# Patient Record
Sex: Female | Born: 1965 | Hispanic: Yes | Smoking: Never smoker
Health system: Southern US, Community
[De-identification: ages and names within clinical notes are randomized; demographics above are authoritative.]

## PROBLEM LIST (undated history)

## (undated) DIAGNOSIS — I10 Essential (primary) hypertension: Secondary | ICD-10-CM

## (undated) DIAGNOSIS — Z87442 Personal history of urinary calculi: Secondary | ICD-10-CM

## (undated) DIAGNOSIS — E119 Type 2 diabetes mellitus without complications: Secondary | ICD-10-CM

## (undated) DIAGNOSIS — M5430 Sciatica, unspecified side: Secondary | ICD-10-CM

## (undated) DIAGNOSIS — F419 Anxiety disorder, unspecified: Secondary | ICD-10-CM

## (undated) DIAGNOSIS — E039 Hypothyroidism, unspecified: Secondary | ICD-10-CM

## (undated) DIAGNOSIS — K219 Gastro-esophageal reflux disease without esophagitis: Secondary | ICD-10-CM

## (undated) DIAGNOSIS — M549 Dorsalgia, unspecified: Secondary | ICD-10-CM

## (undated) DIAGNOSIS — G4730 Sleep apnea, unspecified: Secondary | ICD-10-CM

## (undated) DIAGNOSIS — E785 Hyperlipidemia, unspecified: Secondary | ICD-10-CM

## (undated) DIAGNOSIS — F32A Depression, unspecified: Secondary | ICD-10-CM

## (undated) HISTORY — DX: Sciatica, unspecified side: M54.30

## (undated) HISTORY — PX: OTHER SURGICAL HISTORY: SHX169

## (undated) HISTORY — DX: Dorsalgia, unspecified: M54.9

## (undated) HISTORY — PX: APPENDECTOMY: SHX54

## (undated) HISTORY — DX: Essential (primary) hypertension: I10

## (undated) HISTORY — PX: ABDOMINAL HYSTERECTOMY: SHX81

## (undated) HISTORY — DX: Type 2 diabetes mellitus without complications: E11.9

## (undated) HISTORY — PX: COLONOSCOPY WITH ESOPHAGOGASTRODUODENOSCOPY (EGD): SHX5779

## (undated) HISTORY — PX: CHOLECYSTECTOMY: SHX55

## (undated) HISTORY — DX: Hyperlipidemia, unspecified: E78.5

## (undated) HISTORY — PX: CERVIX SURGERY: SHX593

## (undated) HISTORY — DX: Hypothyroidism, unspecified: E03.9

## (undated) HISTORY — PX: TONSILLECTOMY: SUR1361

---

## 2000-11-13 HISTORY — PX: NECK SURGERY: SHX720

## 2018-03-29 ENCOUNTER — Encounter: Payer: Self-pay | Admitting: Family Medicine

## 2018-03-29 ENCOUNTER — Ambulatory Visit (INDEPENDENT_AMBULATORY_CARE_PROVIDER_SITE_OTHER): Payer: BLUE CROSS/BLUE SHIELD | Admitting: Family Medicine

## 2018-03-29 DIAGNOSIS — E039 Hypothyroidism, unspecified: Secondary | ICD-10-CM | POA: Diagnosis not present

## 2018-03-29 DIAGNOSIS — I10 Essential (primary) hypertension: Secondary | ICD-10-CM

## 2018-03-29 DIAGNOSIS — G8929 Other chronic pain: Secondary | ICD-10-CM

## 2018-03-29 DIAGNOSIS — M545 Low back pain, unspecified: Secondary | ICD-10-CM | POA: Insufficient documentation

## 2018-03-29 DIAGNOSIS — Z1231 Encounter for screening mammogram for malignant neoplasm of breast: Secondary | ICD-10-CM | POA: Diagnosis not present

## 2018-03-29 DIAGNOSIS — K219 Gastro-esophageal reflux disease without esophagitis: Secondary | ICD-10-CM | POA: Diagnosis not present

## 2018-03-29 DIAGNOSIS — Z23 Encounter for immunization: Secondary | ICD-10-CM

## 2018-03-29 DIAGNOSIS — M5442 Lumbago with sciatica, left side: Secondary | ICD-10-CM

## 2018-03-29 DIAGNOSIS — I152 Hypertension secondary to endocrine disorders: Secondary | ICD-10-CM | POA: Insufficient documentation

## 2018-03-29 MED ORDER — LISINOPRIL-HYDROCHLOROTHIAZIDE 20-25 MG PO TABS
1.0000 | ORAL_TABLET | Freq: Every day | ORAL | 3 refills | Status: DC
Start: 2018-03-29 — End: 2018-06-18

## 2018-03-29 NOTE — Assessment & Plan Note (Signed)
Patient has BMI of 39 with hypertension and thyroid disease his comorbidities

## 2018-03-29 NOTE — Progress Notes (Signed)
BP 137/74   Pulse 82   Temp 97.6 F (36.4 C) (Oral)   Ht '5\' 4"'  (1.626 m)   Wt 232 lb (105.2 kg)   BMI 39.82 kg/m    Subjective:    Patient ID: Tina Chapman, female    DOB: [birth year], 52 y.o.   MRN: [id number]  HPI: Tina Chapman is a 52 y.o. female presenting on 03/29/2018 for Establish Care (edema in ankles, refill medications)   HPI Hypertension Patient is coming as a new patient to our office to establish care with our office.  Patient is currently on lisinopril-hydrochlorothiazide, and their blood pressure today is 137/74. Patient denies any lightheadedness or dizziness. Patient denies headaches, blurred vision, chest pains, shortness of breath, or weakness. Denies any side effects from medication and is content with current medication.   Hypothyroidism recheck Patient is coming in for thyroid recheck today as well. They deny any issues with hair changes or heat or cold problems or diarrhea or constipation. They deny any chest pain or palpitations. They are currently on levothyroxine 159mcrograms but she has been off of her medication recently.  GERD Patient is currently on no medication currently.  She denies any major symptoms or abdominal pain or belching or burping. She denies any blood in her stool or lightheadedness or dizziness.   Chronic low back pain with sciatica on left Patient has chronic low back pain with left-sided sciatica and she uses Flexeril and Voltaren for this which have been keeping her mostly stable.  She says she is not having too much of it today but that it comes off and on and stays a little bit with her all the time.  She says today is very mild.  She denies any numbness or weakness in either legs or loss of bowel or bladder.  Relevant past medical, surgical, family and social history reviewed and updated as indicated. Interim medical history since our last visit reviewed. Allergies and medications reviewed and updated.  Review of  Systems  Constitutional: Positive for unexpected weight change. Negative for chills and fever.  Eyes: Negative for visual disturbance.  Respiratory: Negative for chest tightness and shortness of breath.   Cardiovascular: Negative for chest pain and leg swelling.  Endocrine: Negative for cold intolerance and heat intolerance.  Genitourinary: Negative for difficulty urinating and dysuria.  Musculoskeletal: Positive for back pain. Negative for gait problem.  Skin: Negative for rash.  Neurological: Negative for light-headedness and headaches.  Psychiatric/Behavioral: Negative for agitation and behavioral problems.  All other systems reviewed and are negative.   Per HPI unless specifically indicated above  Social History   Socioeconomic History  . Marital status: Divorced    Spouse name: Not on file  . Number of children: Not on file  . Years of education: Not on file  . Highest education level: Not on file  Occupational History  . Not on file  Social Needs  . Financial resource strain: Not on file  . Food insecurity:    Worry: Not on file    Inability: Not on file  . Transportation needs:    Medical: Not on file    Non-medical: Not on file  Tobacco Use  . Smoking status: Never Smoker  . Smokeless tobacco: Never Used  Substance and Sexual Activity  . Alcohol use: Never    Frequency: Never  . Drug use: Never  . Sexual activity: Not Currently  Lifestyle  . Physical activity:    Days per week: Not on file  Minutes per session: Not on file  . Stress: Not on file  Relationships  . Social connections:    Talks on phone: Not on file    Gets together: Not on file    Attends religious service: Not on file    Active member of club or organization: Not on file    Attends meetings of clubs or organizations: Not on file    Relationship status: Not on file  . Intimate partner violence:    Fear of current or ex partner: Not on file    Emotionally abused: Not on file     Physically abused: Not on file    Forced sexual activity: Not on file  Other Topics Concern  . Not on file  Social History Narrative   3 son 47 years old, here with her    Past Surgical History:  Procedure Laterality Date  . ABDOMINAL HYSTERECTOMY    . APPENDECTOMY    . CERVIX SURGERY    . CHOLECYSTECTOMY    . TONSILLECTOMY      Family History  Problem Relation Age of Onset  . Early death Mother        19  . Arthritis Mother   . Heart disease Mother   . Arthritis Father   . Cancer Maternal Grandmother        colon, age 90  . Diabetes Paternal Uncle     Allergies as of 03/29/2018   No Known Allergies     Medication List        Accurate as of 03/29/18 11:59 PM. Always use your most recent med list.          cyclobenzaprine 5 MG tablet Commonly known as:  FLEXERIL Take 5 mg by mouth 3 (three) times daily as needed for muscle spasms.   diclofenac 50 MG EC tablet Commonly known as:  VOLTAREN Take 50 mg by mouth 2 (two) times daily.   levothyroxine 100 MCG tablet Commonly known as:  SYNTHROID, LEVOTHROID Take 100 mcg by mouth daily before breakfast.   lisinopril-hydrochlorothiazide 20-25 MG tablet Commonly known as:  PRINZIDE,ZESTORETIC Take 1 tablet by mouth daily.          Objective:    BP 137/74   Pulse 82   Temp 97.6 F (36.4 C) (Oral)   Ht '5\' 4"'  (1.626 m)   Wt 232 lb (105.2 kg)   BMI 39.82 kg/m   Wt Readings from Last 3 Encounters:  03/29/18 232 lb (105.2 kg)    Physical Exam  Constitutional: She is oriented to person, place, and time. She appears well-developed and well-nourished. No distress.  Eyes: Conjunctivae are normal.  Cardiovascular: Normal rate, regular rhythm, normal heart sounds and intact distal pulses.  No murmur heard. Pulmonary/Chest: Effort normal and breath sounds normal. No respiratory distress. She has no wheezes.  Musculoskeletal: Normal range of motion. She exhibits tenderness (Mild bilateral lumbar tenderness in a  bandlike area, negative straight leg raise bilaterally). She exhibits no edema.  Neurological: She is alert and oriented to person, place, and time. Coordination normal.  Skin: Skin is warm and dry. No rash noted. She is not diaphoretic.  Psychiatric: She has a normal mood and affect. Her behavior is normal.  Nursing note and vitals reviewed.       Assessment & Plan:   Problem List Items Addressed This Visit      Cardiovascular and Mediastinum   Hypertension   Relevant Medications   lisinopril-hydrochlorothiazide (PRINZIDE,ZESTORETIC) 20-25 MG tablet  Other Relevant Orders   CMP14+EGFR (Completed)   CBC with Differential/Platelet (Completed)     Digestive   GERD (gastroesophageal reflux disease)     Endocrine   Hypothyroidism (acquired)   Relevant Orders   TSH (Completed)   CBC with Differential/Platelet (Completed)     Other   Morbid obesity ([place])    Patient has BMI of 39 with hypertension and thyroid disease his comorbidities      Relevant Orders   Lipid panel (Completed)   Chronic lumbar pain   Relevant Medications   cyclobenzaprine (FLEXERIL) 5 MG tablet   diclofenac (VOLTAREN) 50 MG EC tablet    Other Visit Diagnoses    Screening mammogram, encounter for       Relevant Orders   MS DIGITAL SCREENING TOMO BILATERAL     We discussed BMI and obesity and weight loss strategies and will readdress that more in the future visits.  Continue Flexeril and Voltaren for her lower back.  Check thyroid to see if she still needs levothyroxine, she feels like she does not.  Continue lisinopril hydrochlorothiazide.  Follow up plan: Return in about 1 month (around 04/26/2018), or if symptoms worsen or fail to improve, for Recheck thyroid.  Caryl Pina, MD [facility] 04/03/2018, 5:56 PM

## 2018-03-30 LAB — CBC WITH DIFFERENTIAL/PLATELET
BASOS: 1 %
Basophils Absolute: 0 10*3/uL (ref 0.0–0.2)
EOS (ABSOLUTE): 0.4 10*3/uL (ref 0.0–0.4)
Eos: 7 %
Hematocrit: 40.3 % (ref 34.0–46.6)
Hemoglobin: 13.8 g/dL (ref 11.1–15.9)
IMMATURE GRANULOCYTES: 0 %
Immature Grans (Abs): 0 10*3/uL (ref 0.0–0.1)
Lymphocytes Absolute: 2.2 10*3/uL (ref 0.7–3.1)
Lymphs: 35 %
MCH: 31.8 pg (ref 26.6–33.0)
MCHC: 34.2 g/dL (ref 31.5–35.7)
MCV: 93 fL (ref 79–97)
Monocytes Absolute: 0.5 10*3/uL (ref 0.1–0.9)
Monocytes: 7 %
NEUTROS ABS: 3.1 10*3/uL (ref 1.4–7.0)
NEUTROS PCT: 50 %
PLATELETS: 279 10*3/uL (ref 150–379)
RBC: 4.34 x10E6/uL (ref 3.77–5.28)
RDW: 13.1 % (ref 12.3–15.4)
WBC: 6.2 10*3/uL (ref 3.4–10.8)

## 2018-03-30 LAB — CMP14+EGFR
ALT: 50 IU/L — AB (ref 0–32)
AST: 34 IU/L (ref 0–40)
Albumin/Globulin Ratio: 1.7 (ref 1.2–2.2)
Albumin: 4.5 g/dL (ref 3.5–5.5)
Alkaline Phosphatase: 80 IU/L (ref 39–117)
BILIRUBIN TOTAL: 1.3 mg/dL — AB (ref 0.0–1.2)
BUN/Creatinine Ratio: 21 (ref 9–23)
BUN: 15 mg/dL (ref 6–24)
CHLORIDE: 101 mmol/L (ref 96–106)
CO2: 23 mmol/L (ref 20–29)
Calcium: 9.8 mg/dL (ref 8.7–10.2)
Creatinine, Ser: 0.72 mg/dL (ref 0.57–1.00)
GFR calc non Af Amer: 97 mL/min/{1.73_m2} (ref >59)
GFR, EST AFRICAN AMERICAN: 112 mL/min/{1.73_m2} (ref >59)
GLOBULIN, TOTAL: 2.7 g/dL (ref 1.5–4.5)
Glucose: 96 mg/dL (ref 65–99)
POTASSIUM: 4.5 mmol/L (ref 3.5–5.2)
Sodium: 139 mmol/L (ref 134–144)
TOTAL PROTEIN: 7.2 g/dL (ref 6.0–8.5)

## 2018-03-30 LAB — LIPID PANEL
CHOL/HDL RATIO: 5.3 ratio — AB (ref 0.0–4.4)
Cholesterol, Total: 226 mg/dL — ABNORMAL HIGH (ref 100–199)
HDL: 43 mg/dL (ref >39)
LDL Calculated: 145 mg/dL — ABNORMAL HIGH (ref 0–99)
Triglycerides: 192 mg/dL — ABNORMAL HIGH (ref 0–149)
VLDL Cholesterol Cal: 38 mg/dL (ref 5–40)

## 2018-03-30 LAB — TSH: TSH: 81.79 u[IU]/mL — ABNORMAL HIGH (ref 0.450–4.500)

## 2018-04-03 ENCOUNTER — Telehealth: Payer: Self-pay | Admitting: Family Medicine

## 2018-04-03 DIAGNOSIS — E039 Hypothyroidism, unspecified: Secondary | ICD-10-CM

## 2018-04-03 DIAGNOSIS — R748 Abnormal levels of other serum enzymes: Secondary | ICD-10-CM

## 2018-04-03 MED ORDER — LEVOTHYROXINE SODIUM 100 MCG PO TABS: 100.0000 ug | ORAL_TABLET | Freq: Every day | ORAL | 2 refills | Status: DC

## 2018-04-03 NOTE — Telephone Encounter (Signed)
Patient's thyroid is definitely low, she needs to take her Synthroid and I have sent a new prescription of 100 mcg for her.  I want her to come in for a lab draw in 4 weeks to recheck this.  Her cholesterol is borderline elevated but that could be correlated to the thyroid issue.  1 of her liver function numbers is borderline as well but we will just monitor this and recheck it with her next lab draw as well. Arville Care, MD [facility] 04/03/2018, 8:07 AM

## 2018-04-03 NOTE — Telephone Encounter (Signed)
Attempted to contact patient - NVM 

## 2018-04-05 ENCOUNTER — Encounter: Payer: Self-pay | Admitting: Family Medicine

## 2018-04-09 ENCOUNTER — Telehealth: Payer: Self-pay | Admitting: Family Medicine

## 2018-04-09 NOTE — Telephone Encounter (Signed)
What symptoms do you have? Dizzy, headache, throwing up Sore throat started this weekend, she does not have tonsils, wants antibiotic  How long have you been sick? Since today after 12  Have you been seen for this problem? no  If your provider decides to give you a prescription, which pharmacy would you like for it to be sent to? [facility]   Patient informed that this information will be sent to the clinical staff for review and that they should receive a follow up call.

## 2018-04-09 NOTE — Telephone Encounter (Signed)
Pt is emailing DR D with mychart - will close this note

## 2018-04-10 NOTE — Telephone Encounter (Signed)
Have her come in for a visit so we can assess whether an antibiotic is warranted or not.

## 2018-04-11 ENCOUNTER — Encounter: Payer: Self-pay | Admitting: Family Medicine

## 2018-04-11 ENCOUNTER — Ambulatory Visit (INDEPENDENT_AMBULATORY_CARE_PROVIDER_SITE_OTHER): Payer: BLUE CROSS/BLUE SHIELD | Admitting: Family Medicine

## 2018-04-11 ENCOUNTER — Other Ambulatory Visit: Payer: Self-pay | Admitting: Family Medicine

## 2018-04-11 VITALS — BP 90/60 | HR 90 | Temp 96.8°F | Ht 64.0 in | Wt 231.0 lb

## 2018-04-11 DIAGNOSIS — J0100 Acute maxillary sinusitis, unspecified: Secondary | ICD-10-CM

## 2018-04-11 DIAGNOSIS — R300 Dysuria: Secondary | ICD-10-CM | POA: Diagnosis not present

## 2018-04-11 DIAGNOSIS — I10 Essential (primary) hypertension: Secondary | ICD-10-CM

## 2018-04-11 LAB — URINALYSIS, COMPLETE
Bilirubin, UA: NEGATIVE
Glucose, UA: NEGATIVE
LEUKOCYTES UA: NEGATIVE
Nitrite, UA: POSITIVE — AB
PH UA: 5.5 (ref 5.0–7.5)
RBC, UA: NEGATIVE
Specific Gravity, UA: 1.015 (ref 1.005–1.030)
Urobilinogen, Ur: 0.2 mg/dL (ref 0.2–1.0)

## 2018-04-11 LAB — MICROSCOPIC EXAMINATION
RBC, UA: NONE SEEN /hpf (ref 0–2)
Renal Epithel, UA: NONE SEEN /hpf

## 2018-04-11 MED ORDER — AMOXICILLIN-POT CLAVULANATE 875-125 MG PO TABS: 1.0000 | ORAL_TABLET | Freq: Two times a day (BID) | ORAL | 0 refills | Status: DC

## 2018-04-11 MED ORDER — ONDANSETRON HCL 4 MG PO TABS: 4.0000 mg | ORAL_TABLET | Freq: Three times a day (TID) | ORAL | 0 refills | Status: DC | PRN

## 2018-04-11 NOTE — Patient Instructions (Signed)
Great to see you!  Amoxicillin for your sinus pressure.  I believe he may have a sinus infection and you do have fluid on your ears that could be contributing to the dizziness.  Stop your blood pressure medication for 3 days then start back at 1/2 tablet daily.  Come back in about 2 weeks to see Dr. Louanne Skye.  Use Zofran as needed for nausea.

## 2018-04-11 NOTE — Progress Notes (Signed)
   HPI  Patient presents today for acute visit.  Patient explains she has had 2 days.  This began with upset stomach and dizziness described as room spinning sensation.  She also felt weak.  She missed work that day.  She also missed work yesterday and attempted to go back today. She developed worsening upset stomach had 2-3 episodes of emesis, and began having diarrhea today. She has no sick contacts.  She does have some sinus tenderness on the left side, she does have some persistent cough. She is weak feeling. She is tolerating food and fluids like usual.  PMH: Smoking status noted ROS: Per HPI  Objective: BP 90/60   Pulse 90   Temp (!) 96.8 F (36 C) (Oral)   Ht _0  (1.626 m)   Wt 231 lb (104.8 kg)   BMI 39.65 kg/m  Gen: NAD, alert, cooperative with exam HEENT: NCAT, oropharynx moist, TMs bilaterally with effusion without loss of landmarks, left-sided maxillary tenderness to palpation CV: RRR, good S1/S2, no murmur Resp: CTABL, no wheezes, non-labored Abd: SNTND, BS present, no guarding or organomegaly, no suprapubic tenderness, no CVA tenderness Ext: No edema, warm Neuro: Alert and oriented, No gross deficits  Assessment and plan:  #Acute maxillary sinusitis Patient with unusual symptoms, this is not sinusitis alone. We have covered sinusitis with Augmentin. She also has bilateral ear effusion and symptoms of viral gastroenteritis Discussed that Augmentin could contribute to more diarrhea, however I believe that will benefit her overall. Checking UA for UTI  #Hypertension BPs are soft today Recommended holding x3 days while she is sick, then restarting at half dose Follow-up with PCP in 2 weeks  #Dysuria Patient also complains of dysuria and foul-smelling urine, UA is pending, Augmentin would be good coverage if there is an infection.   Orders Placed This Encounter  Procedures  . CMP14+EGFR  . CBC with Differential/Platelet  . Urinalysis, Complete     Meds ordered this encounter  Medications  . amoxicillin-clavulanate (AUGMENTIN) 875-125 MG tablet    Sig: Take 1 tablet by mouth 2 (two) times daily.    Dispense:  20 tablet    Refill:  0  . ondansetron (ZOFRAN) 4 MG tablet    Sig: Take 1 tablet (4 mg total) by mouth every 8 (eight) hours as needed for nausea or vomiting.    Dispense:  20 tablet    Refill:  0    Laroy Apple, MD [facility] 04/11/2018, 3:25 PM

## 2018-04-11 NOTE — Addendum Note (Signed)
Addended by: Elenora Gamma on: 04/11/2018 04:57 PM   Modules accepted: Orders

## 2018-04-11 NOTE — Telephone Encounter (Signed)
Pt had appt today.

## 2018-04-12 ENCOUNTER — Encounter: Payer: Self-pay | Admitting: Family Medicine

## 2018-04-12 LAB — CBC WITH DIFFERENTIAL/PLATELET
BASOS ABS: 0.1 10*3/uL (ref 0.0–0.2)
BASOS: 1 %
EOS (ABSOLUTE): 0.4 10*3/uL (ref 0.0–0.4)
Eos: 4 %
Hematocrit: 41.5 % (ref 34.0–46.6)
Hemoglobin: 14 g/dL (ref 11.1–15.9)
Immature Grans (Abs): 0 10*3/uL (ref 0.0–0.1)
Immature Granulocytes: 0 %
Lymphocytes Absolute: 2.9 10*3/uL (ref 0.7–3.1)
Lymphs: 30 %
MCH: 31.7 pg (ref 26.6–33.0)
MCHC: 33.7 g/dL (ref 31.5–35.7)
MCV: 94 fL (ref 79–97)
MONOS ABS: 0.6 10*3/uL (ref 0.1–0.9)
Monocytes: 6 %
NEUTROS ABS: 5.7 10*3/uL (ref 1.4–7.0)
Neutrophils: 59 %
PLATELETS: 329 10*3/uL (ref 150–450)
RBC: 4.41 x10E6/uL (ref 3.77–5.28)
RDW: 13 % (ref 12.3–15.4)
WBC: 9.6 10*3/uL (ref 3.4–10.8)

## 2018-04-12 LAB — CMP14+EGFR
A/G RATIO: 1.7 (ref 1.2–2.2)
ALT: 45 IU/L — AB (ref 0–32)
AST: 35 IU/L (ref 0–40)
Albumin: 4.8 g/dL (ref 3.5–5.5)
Alkaline Phosphatase: 69 IU/L (ref 39–117)
BILIRUBIN TOTAL: 1.1 mg/dL (ref 0.0–1.2)
BUN/Creatinine Ratio: 24 — ABNORMAL HIGH (ref 9–23)
BUN: 36 mg/dL — ABNORMAL HIGH (ref 6–24)
CALCIUM: 10.1 mg/dL (ref 8.7–10.2)
CHLORIDE: 98 mmol/L (ref 96–106)
CO2: 21 mmol/L (ref 20–29)
Creatinine, Ser: 1.47 mg/dL — ABNORMAL HIGH (ref 0.57–1.00)
GFR, EST AFRICAN AMERICAN: 47 mL/min/{1.73_m2} — AB (ref >59)
GFR, EST NON AFRICAN AMERICAN: 41 mL/min/{1.73_m2} — AB (ref >59)
GLOBULIN, TOTAL: 2.8 g/dL (ref 1.5–4.5)
Glucose: 85 mg/dL (ref 65–99)
POTASSIUM: 4.6 mmol/L (ref 3.5–5.2)
SODIUM: 138 mmol/L (ref 134–144)
TOTAL PROTEIN: 7.6 g/dL (ref 6.0–8.5)

## 2018-04-13 LAB — URINE CULTURE

## 2018-04-19 ENCOUNTER — Encounter: Payer: Self-pay | Admitting: Family Medicine

## 2018-04-19 ENCOUNTER — Ambulatory Visit (INDEPENDENT_AMBULATORY_CARE_PROVIDER_SITE_OTHER): Payer: BLUE CROSS/BLUE SHIELD | Admitting: Family Medicine

## 2018-04-19 VITALS — BP 117/76 | HR 75 | Temp 97.0°F | Ht 64.0 in | Wt 231.0 lb

## 2018-04-19 DIAGNOSIS — E039 Hypothyroidism, unspecified: Secondary | ICD-10-CM | POA: Diagnosis not present

## 2018-04-19 DIAGNOSIS — N179 Acute kidney failure, unspecified: Secondary | ICD-10-CM

## 2018-04-19 DIAGNOSIS — J309 Allergic rhinitis, unspecified: Secondary | ICD-10-CM

## 2018-04-19 NOTE — Progress Notes (Signed)
BP 117/76 (BP Location: Left Arm)   Pulse 75   Temp (!) 97 F (36.1 C) (Oral)   Ht '5\' 4"'  (1.626 m)   Wt 231 lb (104.8 kg)   BMI 39.65 kg/m    Subjective:    Patient ID: Tina Chapman, female    DOB: 1966/10/17, 52 y.o.   MRN: [id number]  HPI: Tina Chapman is a 52 y.o. female presenting on 04/19/2018 for review labs and Sinusitis (throat itches, some cough, eye watery )   HPI Cough and throat irritation and sinus congestion Patient has been having cough and throat irritation and sinus congestion.  It has improved with the Augmentin that she was given a week ago that she is still taking but she still feels like it is still irritated and not completely resolving.  She denies any fevers or chills or shortness of breath or wheezing.  She says her urinary tract symptoms have improved as well.  She still has mostly on the left side her throat itches and her eyes water and some cough that is been mostly nonproductive now.  Patient's blood pressure is normal and she has not taken her blood pressure medication because it was low when she saw her previous provider.  Recommended to go ahead and continue to hold the blood pressure medication for now.  She did have AKI on the last visit that she was here, we will recheck it today and see where it is at and recommended fluids and hydration.  Hypothyroidism recheck Patient is coming in for thyroid recheck today as well. They deny any issues with hair changes or heat or cold problems or diarrhea or constipation. They deny any chest pain or palpitations. They are currently on levothyroxine 178mcrograms   Relevant past medical, surgical, family and social history reviewed and updated as indicated. Interim medical history since our last visit reviewed. Allergies and medications reviewed and updated.  Review of Systems  Constitutional: Negative for chills and fever.  HENT: Positive for congestion and sore throat. Negative for ear discharge, ear  pain, sinus pressure, sinus pain, sneezing and tinnitus.   Eyes: Negative for visual disturbance.  Respiratory: Positive for cough. Negative for chest tightness and shortness of breath.   Cardiovascular: Negative for chest pain and leg swelling.  Genitourinary: Negative for difficulty urinating and dysuria.  Musculoskeletal: Negative for back pain and gait problem.  Skin: Negative for rash.  Neurological: Negative for light-headedness and headaches.  Psychiatric/Behavioral: Negative for agitation and behavioral problems.  All other systems reviewed and are negative.   Per HPI unless specifically indicated above   Allergies as of 04/19/2018   No Known Allergies     Medication List        Accurate as of 04/19/18 11:09 AM. Always use your most recent med list.          amoxicillin-clavulanate 875-125 MG tablet Commonly known as:  AUGMENTIN Take 1 tablet by mouth 2 (two) times daily.   levothyroxine 100 MCG tablet Commonly known as:  SYNTHROID, LEVOTHROID Take 1 tablet (100 mcg total) by mouth daily before breakfast.   lisinopril-hydrochlorothiazide 20-25 MG tablet Commonly known as:  PRINZIDE,ZESTORETIC Take 1 tablet by mouth daily.   ondansetron 4 MG tablet Commonly known as:  ZOFRAN Take 1 tablet (4 mg total) by mouth every 8 (eight) hours as needed for nausea or vomiting.          Objective:    BP 117/76 (BP Location: Left Arm)   Pulse 75  Temp (!) 97 F (36.1 C) (Oral)   Ht '5\' 4"'  (1.626 m)   Wt 231 lb (104.8 kg)   BMI 39.65 kg/m   Wt Readings from Last 3 Encounters:  04/19/18 231 lb (104.8 kg)  04/11/18 231 lb (104.8 kg)  03/29/18 232 lb (105.2 kg)    Physical Exam  Constitutional: She is oriented to person, place, and time. She appears well-developed and well-nourished. No distress.  HENT:  Nose: Nose normal.  Mouth/Throat: Oropharynx is clear and moist. No oropharyngeal exudate.  Eyes: Pupils are equal, round, and reactive to light. Conjunctivae and  EOM are normal.  Neck: Neck supple. No thyromegaly present.  Cardiovascular: Normal rate, regular rhythm, normal heart sounds and intact distal pulses.  No murmur heard. Pulmonary/Chest: Effort normal and breath sounds normal. No respiratory distress. She has no wheezes.  Lymphadenopathy:    She has no cervical adenopathy.  Neurological: She is alert and oriented to person, place, and time. Coordination normal.  Skin: Skin is warm and dry. No rash noted. She is not diaphoretic.  Psychiatric: She has a normal mood and affect. Her behavior is normal.  Nursing note and vitals reviewed.       Assessment & Plan:   Problem List Items Addressed This Visit      Endocrine   Hypothyroidism (acquired)   Relevant Orders   TSH    Other Visit Diagnoses    Allergic sinusitis    -  Primary   Recommended Flonase and Claritin, patient is still on Augmentin and I think the residual is allergies   Acute kidney injury ([place])       Relevant Orders   BMP8+EGFR       Follow up plan: Return in about 1 month (around 05/17/2018), or if symptoms worsen or fail to improve, for Hypertension recheck.  Counseling provided for all of the vaccine components Orders Placed This Encounter  Procedures  . TSH  . BMP8+EGFR    Caryl Pina, MD [facility] 04/19/2018, 11:09 AM

## 2018-04-19 NOTE — Patient Instructions (Signed)
Hold blood pressure medication for now and monitor blood pressure and let me know in a couple weeks how is going.

## 2018-04-20 ENCOUNTER — Other Ambulatory Visit: Payer: Self-pay | Admitting: *Deleted

## 2018-04-20 LAB — TSH: TSH: 20.04 u[IU]/mL — AB (ref 0.450–4.500)

## 2018-04-20 LAB — BMP8+EGFR
BUN / CREAT RATIO: 24 — AB (ref 9–23)
BUN: 15 mg/dL (ref 6–24)
CHLORIDE: 104 mmol/L (ref 96–106)
CO2: 25 mmol/L (ref 20–29)
Calcium: 9.7 mg/dL (ref 8.7–10.2)
Creatinine, Ser: 0.62 mg/dL (ref 0.57–1.00)
GFR calc Af Amer: 121 mL/min/{1.73_m2} (ref >59)
GFR calc non Af Amer: 105 mL/min/{1.73_m2} (ref >59)
GLUCOSE: 136 mg/dL — AB (ref 65–99)
Potassium: 3.8 mmol/L (ref 3.5–5.2)
SODIUM: 144 mmol/L (ref 134–144)

## 2018-04-20 MED ORDER — LEVOTHYROXINE SODIUM 112 MCG PO TABS: 112.0000 ug | ORAL_TABLET | Freq: Every day | ORAL | 3 refills | Status: DC

## 2018-04-24 ENCOUNTER — Encounter: Payer: Self-pay | Admitting: Family Medicine

## 2018-04-26 ENCOUNTER — Ambulatory Visit: Payer: BLUE CROSS/BLUE SHIELD | Admitting: Family Medicine

## 2018-05-18 ENCOUNTER — Encounter: Payer: Self-pay | Admitting: Family Medicine

## 2018-05-24 ENCOUNTER — Ambulatory Visit: Payer: BLUE CROSS/BLUE SHIELD | Admitting: Family Medicine

## 2018-05-31 ENCOUNTER — Ambulatory Visit: Payer: BLUE CROSS/BLUE SHIELD | Admitting: Family Medicine

## 2018-06-18 ENCOUNTER — Ambulatory Visit (INDEPENDENT_AMBULATORY_CARE_PROVIDER_SITE_OTHER): Payer: BLUE CROSS/BLUE SHIELD | Admitting: Family Medicine

## 2018-06-18 ENCOUNTER — Encounter: Payer: Self-pay | Admitting: Family Medicine

## 2018-06-18 VITALS — BP 165/105 | HR 81 | Temp 97.3°F | Ht 64.0 in | Wt 227.4 lb

## 2018-06-18 DIAGNOSIS — M545 Low back pain: Secondary | ICD-10-CM

## 2018-06-18 MED ORDER — PREDNISONE 10 MG PO TABS: ORAL_TABLET | ORAL | 0 refills | Status: DC

## 2018-06-18 MED ORDER — CYCLOBENZAPRINE HCL 10 MG PO TABS: 10.0000 mg | ORAL_TABLET | Freq: Three times a day (TID) | ORAL | 0 refills | Status: DC | PRN

## 2018-06-18 NOTE — Patient Instructions (Signed)
Use Ice frequently       Back Exercises If you have pain in your back, do these exercises 2-3 times each day or as told by your doctor. When the pain goes away, do the exercises once each day, but repeat the steps more times for each exercise (do more repetitions). If you do not have pain in your back, do these exercises once each day or as told by your doctor. Exercises Single Knee to Chest  Do these steps 3-5 times in a row for each leg: 1. Lie on your back on a firm bed or the floor with your legs stretched out. 2. Bring one knee to your chest. 3. Hold your knee to your chest by grabbing your knee or thigh. 4. Pull on your knee until you feel a gentle stretch in your lower back. 5. Keep doing the stretch for 10-30 seconds. 6. Slowly let go of your leg and straighten it.  Pelvic Tilt  Do these steps 5-10 times in a row: 1. Lie on your back on a firm bed or the floor with your legs stretched out. 2. Bend your knees so they point up to the ceiling. Your feet should be flat on the floor. 3. Tighten your lower belly (abdomen) muscles to press your lower back against the floor. This will make your tailbone point up to the ceiling instead of pointing down to your feet or the floor. 4. Stay in this position for 5-10 seconds while you gently tighten your muscles and breathe evenly.  Cat-Cow  Do these steps until your lower back bends more easily: 1. Get on your hands and knees on a firm surface. Keep your hands under your shoulders, and keep your knees under your hips. You may put padding under your knees. 2. Let your head hang down, and make your tailbone point down to the floor so your lower back is round like the back of a cat. 3. Stay in this position for 5 seconds. 4. Slowly lift your head and make your tailbone point up to the ceiling so your back hangs low (sags) like the back of a cow. 5. Stay in this position for 5 seconds.  Press-Ups  Do these steps 5-10 times in a  row: 1. Lie on your belly (face-down) on the floor. 2. Place your hands near your head, about shoulder-width apart. 3. While you keep your back relaxed and keep your hips on the floor, slowly straighten your arms to raise the top half of your body and lift your shoulders. Do not use your back muscles. To make yourself more comfortable, you may change where you place your hands. 4. Stay in this position for 5 seconds. 5. Slowly return to lying flat on the floor.  Bridges  Do these steps 10 times in a row: 1. Lie on your back on a firm surface. 2. Bend your knees so they point up to the ceiling. Your feet should be flat on the floor. 3. Tighten your butt muscles and lift your butt off of the floor until your waist is almost as high as your knees. If you do not feel the muscles working in your butt and the back of your thighs, slide your feet 1-2 inches farther away from your butt. 4. Stay in this position for 3-5 seconds. 5. Slowly lower your butt to the floor, and let your butt muscles relax.  If this exercise is too easy, try doing it with your arms crossed over your chest.  Belly Crunches  Do these steps 5-10 times in a row: 1. Lie on your back on a firm bed or the floor with your legs stretched out. 2. Bend your knees so they point up to the ceiling. Your feet should be flat on the floor. 3. Cross your arms over your chest. 4. Tip your chin a little bit toward your chest but do not bend your neck. 5. Tighten your belly muscles and slowly raise your chest just enough to lift your shoulder blades a tiny bit off of the floor. 6. Slowly lower your chest and your head to the floor.  Back Lifts Do these steps 5-10 times in a row: 1. Lie on your belly (face-down) with your arms at your sides, and rest your forehead on the floor. 2. Tighten the muscles in your legs and your butt. 3. Slowly lift your chest off of the floor while you keep your hips on the floor. Keep the back of your head in  line with the curve in your back. Look at the floor while you do this. 4. Stay in this position for 3-5 seconds. 5. Slowly lower your chest and your face to the floor.  Contact a doctor if:  Your back pain gets a lot worse when you do an exercise.  Your back pain does not lessen 2 hours after you exercise. If you have any of these problems, stop doing the exercises. Do not do them again unless your doctor says it is okay. Get help right away if:  You have sudden, very bad back pain. If this happens, stop doing the exercises. Do not do them again unless your doctor says it is okay. This information is not intended to replace advice given to you by your health care provider. Make sure you discuss any questions you have with your health care provider. Document Released: 12/02/2010 Document Revised: 04/06/2016 Document Reviewed: 12/24/2014 Elsevier Interactive Patient Education  [employer].
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
## 2018-06-21 ENCOUNTER — Encounter: Payer: Self-pay | Admitting: Family Medicine

## 2018-06-29 ENCOUNTER — Encounter: Payer: Self-pay | Admitting: Family Medicine

## 2018-06-30 ENCOUNTER — Encounter: Payer: Self-pay | Admitting: Family Medicine

## 2018-07-01 ENCOUNTER — Encounter: Payer: Self-pay | Admitting: Family Medicine

## 2018-07-02 ENCOUNTER — Telehealth: Payer: Self-pay | Admitting: *Deleted

## 2018-07-09 ENCOUNTER — Ambulatory Visit: Payer: BLUE CROSS/BLUE SHIELD | Admitting: Family Medicine

## 2018-07-21 ENCOUNTER — Encounter: Payer: Self-pay | Admitting: Family Medicine

## 2018-07-22 ENCOUNTER — Ambulatory Visit: Payer: BLUE CROSS/BLUE SHIELD | Admitting: Family Medicine

## 2018-07-23 ENCOUNTER — Encounter: Payer: Self-pay | Admitting: Family Medicine

## 2018-08-12 ENCOUNTER — Other Ambulatory Visit: Payer: Self-pay | Admitting: Family Medicine

## 2018-08-16 ENCOUNTER — Ambulatory Visit: Payer: BLUE CROSS/BLUE SHIELD | Admitting: Family Medicine

## 2018-08-23 ENCOUNTER — Ambulatory Visit (INDEPENDENT_AMBULATORY_CARE_PROVIDER_SITE_OTHER): Payer: BLUE CROSS/BLUE SHIELD | Admitting: Family Medicine

## 2018-08-23 ENCOUNTER — Encounter: Payer: Self-pay | Admitting: Family Medicine

## 2018-08-23 VITALS — BP 153/94 | HR 70 | Temp 97.0°F | Ht 64.0 in | Wt 237.6 lb

## 2018-08-23 DIAGNOSIS — E039 Hypothyroidism, unspecified: Secondary | ICD-10-CM | POA: Diagnosis not present

## 2018-08-23 DIAGNOSIS — K219 Gastro-esophageal reflux disease without esophagitis: Secondary | ICD-10-CM

## 2018-08-23 DIAGNOSIS — I10 Essential (primary) hypertension: Secondary | ICD-10-CM

## 2018-08-23 DIAGNOSIS — G8929 Other chronic pain: Secondary | ICD-10-CM

## 2018-08-23 DIAGNOSIS — M5442 Lumbago with sciatica, left side: Secondary | ICD-10-CM | POA: Diagnosis not present

## 2018-08-23 MED ORDER — NAPROXEN 500 MG PO TABS: 500.0000 mg | ORAL_TABLET | Freq: Two times a day (BID) | ORAL | 0 refills | Status: DC

## 2018-08-23 MED ORDER — LEVOTHYROXINE SODIUM 112 MCG PO TABS: 112.0000 ug | ORAL_TABLET | Freq: Every day | ORAL | 3 refills | Status: DC

## 2018-08-23 MED ORDER — CYCLOBENZAPRINE HCL 10 MG PO TABS: 10.0000 mg | ORAL_TABLET | Freq: Three times a day (TID) | ORAL | 1 refills | Status: DC | PRN

## 2018-08-23 NOTE — Progress Notes (Signed)
BP (!) 153/94   Pulse 70   Temp (!) 97 F (36.1 C) (Oral)   Ht 5\' 4"  (1.626 m)   Wt 237 lb 9.6 oz (107.8 kg)   BMI 40.78 kg/m    Subjective:    Patient ID: Tina Chapman, female    DOB: 02-May-1966, 52 y.o.   MRN: [id number]  HPI: Tina Chapman is a 52 y.o. female presenting on 08/23/2018 for Medical Management of Chronic Issues (refills)   HPI Hypothyroidism recheck Patient is coming in for thyroid recheck today as well. They deny any issues with hair changes or heat or cold problems or diarrhea or constipation. They deny any chest pain or palpitations. They are currently on levothyroxine 100 mcg because she never got the 112 micrograms, she says she is still having a lot of fatigue and issues related to because she never got the new dose that she was supposed to be on when the TSH showed she was too low last time.  Hypertension Patient is currently on no medication currently, her blood pressure slightly elevated today but because she just got on bad conversation 20 minutes ago with her insurance company because of some missed payments and lost money that she should have been receiving, and their blood pressure today is 153/94, she shows a life insurance physical that she just had less than 2 weeks ago that shows multiple blood pressures in the 118-120/80 range.. Patient denies any lightheadedness or dizziness. Patient denies headaches, blurred vision, chest pains, shortness of breath, or weakness. Denies any side effects from medication and is content with current medication.   GERD Patient is currently on no issues currently, recommended that she try Zantac as needed if she does have any issues or if they develop.  She denies any major symptoms or abdominal pain or belching or burping. She denies any blood in her stool or lightheadedness or dizziness.   Chronic low back pain Patient is coming in with chronic low back pain that has been bothering her for some time.  She says  is more of a mild achiness that she gets this especially worse if she is been working long day where she bends and lifts a lot of things.  She denies any pain radiating down her legs.  She denies any numbness or weakness.  She has been using Flexeril which has been helping her some and then she was given a short course of prednisone by 1 of my colleagues which did help her some as well.  Is wondering if there is something else that she can take currently.  Relevant past medical, surgical, family and social history reviewed and updated as indicated. Interim medical history since our last visit reviewed. Allergies and medications reviewed and updated.  Review of Systems  Constitutional: Negative for chills and fever.  Eyes: Negative for redness and visual disturbance.  Respiratory: Negative for chest tightness and shortness of breath.   Cardiovascular: Negative for chest pain and leg swelling.  Musculoskeletal: Positive for back pain. Negative for arthralgias and gait problem.  Skin: Negative for rash.  Neurological: Negative for light-headedness and headaches.  Psychiatric/Behavioral: Negative for agitation and behavioral problems.  All other systems reviewed and are negative.   Per HPI unless specifically indicated above   Allergies as of 08/23/2018   No Known Allergies     Medication List        Accurate as of 08/23/18  2:28 PM. Always use your most recent med list.  cyclobenzaprine 10 MG tablet Commonly known as:  FLEXERIL Take 1 tablet (10 mg total) by mouth 3 (three) times daily as needed for muscle spasms.   levothyroxine 112 MCG tablet Commonly known as:  SYNTHROID, LEVOTHROID Take 1 tablet (112 mcg total) by mouth daily.   naproxen 500 MG tablet Commonly known as:  NAPROSYN Take 1 tablet (500 mg total) by mouth 2 (two) times daily with a meal.          Objective:    BP (!) 153/94   Pulse 70   Temp (!) 97 F (36.1 C) (Oral)   Ht 5\' 4"  (1.626 m)    Wt 237 lb 9.6 oz (107.8 kg)   BMI 40.78 kg/m   Wt Readings from Last 3 Encounters:  08/23/18 237 lb 9.6 oz (107.8 kg)  06/18/18 227 lb 6 oz (103.1 kg)  04/19/18 231 lb (104.8 kg)    Physical Exam  Constitutional: She is oriented to person, place, and time. She appears well-developed and well-nourished. No distress.  Eyes: Conjunctivae are normal.  Cardiovascular: Normal rate, regular rhythm, normal heart sounds and intact distal pulses.  No murmur heard. Pulmonary/Chest: Effort normal and breath sounds normal. No respiratory distress. She has no wheezes.  Musculoskeletal: Normal range of motion. She exhibits tenderness (Mild right lower back tenderness, negative straight leg raise bilaterally.). She exhibits no edema.  Neurological: She is alert and oriented to person, place, and time. Coordination normal.  Skin: Skin is warm and dry. No rash noted. She is not diaphoretic.  Psychiatric: She has a normal mood and affect. Her behavior is normal.  Nursing note and vitals reviewed.      Assessment & Plan:   Problem List Items Addressed This Visit      Cardiovascular and Mediastinum   Hypertension - Primary     Digestive   GERD (gastroesophageal reflux disease)     Endocrine   Hypothyroidism (acquired)   Relevant Medications   levothyroxine (SYNTHROID, LEVOTHROID) 112 MCG tablet   Other Relevant Orders   TSH     Other   Chronic lumbar pain   Relevant Medications   cyclobenzaprine (FLEXERIL) 10 MG tablet   naproxen (NAPROSYN) 500 MG tablet       Follow up plan: Return in about 6 months (around 02/22/2019), or if symptoms worsen or fail to improve, for Hypertension and hypothyroid.  Counseling provided for all of the vaccine components Orders Placed This Encounter  Procedures  . TSH    Arville Care, MD [facility] 08/23/2018, 2:28 PM

## 2018-08-24 LAB — TSH: TSH: 33.73 u[IU]/mL — ABNORMAL HIGH (ref 0.450–4.500)

## 2018-08-29 ENCOUNTER — Encounter: Payer: Self-pay | Admitting: Family Medicine

## 2018-08-29 ENCOUNTER — Ambulatory Visit (INDEPENDENT_AMBULATORY_CARE_PROVIDER_SITE_OTHER): Payer: BLUE CROSS/BLUE SHIELD | Admitting: Family Medicine

## 2018-08-29 VITALS — BP 174/104 | HR 89 | Temp 97.2°F | Ht 64.0 in | Wt 233.2 lb

## 2018-08-29 DIAGNOSIS — F419 Anxiety disorder, unspecified: Secondary | ICD-10-CM | POA: Diagnosis not present

## 2018-08-29 DIAGNOSIS — I10 Essential (primary) hypertension: Secondary | ICD-10-CM | POA: Diagnosis not present

## 2018-08-29 MED ORDER — SERTRALINE HCL 50 MG PO TABS: 50.0000 mg | ORAL_TABLET | Freq: Every day | ORAL | 1 refills | Status: DC

## 2018-08-29 NOTE — Progress Notes (Signed)
BP (!) 174/104   Pulse 89   Temp (!) 97.2 F (36.2 C) (Oral)   Ht 5\' 4"  (1.626 m)   Wt 233 lb 3.2 oz (105.8 kg)   BMI 40.03 kg/m    Subjective:    Patient ID: Tina Chapman, female    DOB: [birth year], 51 y.o.   MRN: [id number]  HPI: Tina Chapman is a 52 y.o. female presenting on 08/29/2018 for Hypertension (188/107-today 171/20-yesterday)   HPI Patient's blood pressure initially was 147/91 and then it was 175/104 and then after she calmed down it went down to 130/90 but after she started talking about anxiety and stress at work it went back up to 160/85.  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  She denies any suicidal ideations or thoughts of hurting herself.  She is very concerned also because her blood pressure spikes so much when she gets stressed and then that makes her more stress that she is concerned she is going to have a stroke. Depression screen [facility] 2/9 08/23/2018 06/18/2018 04/19/2018 04/11/2018 03/29/2018  Decreased Interest 0 0 0 1 0  Down, Depressed, Hopeless 0 0 0 3 0  PHQ - 2 Score 0 0 0 4 0  Altered sleeping - - - 3 -  Tired, decreased energy - - - 3 -  Change in appetite - - - 0 -  Feeling bad or failure about yourself  - - - 3 -  Trouble concentrating - - - 0 -  Moving slowly or fidgety/restless - - - 0 -  Suicidal thoughts - - - 0 -  PHQ-9 Score - - - 13 -    Relevant past medical, surgical, family and social history reviewed and updated as indicated. Interim medical history since our last visit reviewed. Allergies and medications reviewed and updated.  Review of Systems  Constitutional: Negative for chills and fever.  Eyes: Negative for visual disturbance.  Respiratory: Negative for chest tightness and shortness of breath.   Cardiovascular:  Negative for chest pain and leg swelling.  Musculoskeletal: Negative for back pain and gait problem.  Skin: Negative for rash.  Neurological: Positive for headaches. Negative for dizziness, weakness, light-headedness and numbness.  Psychiatric/Behavioral: Positive for dysphoric mood. Negative for agitation, behavioral problems, self-injury, sleep disturbance and suicidal ideas. The patient is nervous/anxious.   All other systems reviewed and are negative.   Per HPI unless specifically indicated above   Allergies as of 08/29/2018   No Known Allergies     Medication List        Accurate as of 08/29/18  4:22 PM. Always use your most recent med list.          cyclobenzaprine 10 MG tablet Commonly known as:  FLEXERIL Take 1 tablet (10 mg total) by mouth 3 (three) times daily as needed for muscle spasms.   levothyroxine 112 MCG tablet Commonly known as:  SYNTHROID, LEVOTHROID Take 1 tablet (112 mcg total) by mouth daily.   naproxen 500 MG tablet Commonly known as:  NAPROSYN Take 1 tablet (500 mg total) by mouth 2 (two) times daily with a meal.   sertraline 50 MG tablet Commonly known as:  ZOLOFT Take 1 tablet (50 mg total) by mouth daily.          Objective:  BP (!) 174/104   Pulse 89   Temp (!) 97.2 F (36.2 C) (Oral)   Ht 5\' 4"  (1.626 m)   Wt 233 lb 3.2 oz (105.8 kg)   BMI 40.03 kg/m   Wt Readings from Last 3 Encounters:  08/29/18 233 lb 3.2 oz (105.8 kg)  08/23/18 237 lb 9.6 oz (107.8 kg)  06/18/18 227 lb 6 oz (103.1 kg)    Physical Exam  Constitutional: She is oriented to person, place, and time. She appears well-developed and well-nourished. No distress.  Eyes: Conjunctivae are normal.  Cardiovascular: Normal rate, regular rhythm, normal heart sounds and intact distal pulses.  No murmur heard. Pulmonary/Chest: Effort normal and breath sounds normal. No respiratory distress. She has no wheezes.  Musculoskeletal: Normal range of motion. She exhibits no  edema.  Neurological: She is alert and oriented to person, place, and time. Coordination normal.  Skin: Skin is warm and dry. No rash noted. She is not diaphoretic.  Psychiatric: Her behavior is normal. Her mood appears anxious. She exhibits a depressed mood. She expresses no suicidal ideation. She expresses no suicidal plans.  Nursing note and vitals reviewed.   Results for orders placed or performed in visit on 08/23/18  TSH  Result Value Ref Range   TSH 33.730 (H) 0.450 - 4.500 uIU/mL      Assessment & Plan:   Problem List Items Addressed This Visit      Cardiovascular and Mediastinum   Hypertension     Other   Anxiety - Primary   Relevant Medications   sertraline (ZOLOFT) 50 MG tablet      It sounds like a lot of patient's hypertension is caused by her anxiety, we will try to treat the anxiety first and give Zoloft to see if we can control the emotions and if that we will lower it because her blood pressure goes from being way too high to way too low and last time she was on treatment for hypertension she had hypotensive episodes.  Follow up plan: Return in about 2 months (around 10/29/2018), or if symptoms worsen or fail to improve, for Anxiety.  Counseling provided for all of the vaccine components No orders of the defined types were placed in this encounter.   Arville Care, MD [facility] 08/29/2018, 4:22 PM

## 2018-09-05 ENCOUNTER — Ambulatory Visit (INDEPENDENT_AMBULATORY_CARE_PROVIDER_SITE_OTHER): Payer: BLUE CROSS/BLUE SHIELD | Admitting: Family

## 2018-09-05 ENCOUNTER — Encounter: Payer: Self-pay | Admitting: Family

## 2018-09-05 VITALS — BP 130/88 | HR 79 | Temp 97.1°F | Ht 64.0 in | Wt 234.0 lb

## 2018-09-05 DIAGNOSIS — H1132 Conjunctival hemorrhage, left eye: Secondary | ICD-10-CM

## 2018-09-05 NOTE — Patient Instructions (Signed)

## 2018-09-05 NOTE — Progress Notes (Signed)
   Subjective:    Patient ID: Tina Chapman, female    DOB: 1966/09/10, 52 y.o.   MRN: [id number]  Chief Complaint  Patient presents with  . Left eye red since Tuesday   Pt presents to the office today with left eye redness. She states she noticed on Tuesday a "little red line", but states her eye is worse. She reports half of her left is red. Denies any trauma, foreign body, or rubbing her eyes.  Conjunctivitis   The current episode started 3 to 5 days ago. The onset was gradual. The problem occurs continuously. The problem has been gradually worsening. The problem is mild. The symptoms are relieved by rest and one or more OTC medications. Associated symptoms include photophobia and eye redness. Pertinent negatives include no double vision, no eye itching, no ear discharge, no ear pain, no headaches, no hearing loss, no rhinorrhea, no sore throat, no stridor and no eye discharge. Eye pain: "soreness"      Review of Systems  HENT: Negative for ear discharge, ear pain, hearing loss, rhinorrhea and sore throat.   Eyes: Positive for photophobia and redness. Negative for double vision, discharge and itching. Eye pain: "soreness"  Respiratory: Negative for stridor.   Neurological: Negative for headaches.  All other systems reviewed and are negative.      Objective:   Physical Exam  Constitutional: She is oriented to person, place, and time. She appears well-developed and well-nourished. No distress.  HENT:  Head: Normocephalic and atraumatic.  Eyes: Pupils are equal, round, and reactive to light. Left eye exhibits no discharge and no exudate. No foreign body present in the left eye. Left conjunctiva has a hemorrhage.  Neck: Normal range of motion. Neck supple. No thyromegaly present.  Cardiovascular: Normal rate, regular rhythm, normal heart sounds and intact distal pulses.  No murmur heard. Pulmonary/Chest: Effort normal and breath sounds normal. No respiratory distress. She has no  wheezes.  Abdominal: Soft. Bowel sounds are normal. She exhibits no distension. There is no tenderness.  Musculoskeletal: Normal range of motion. She exhibits no edema or tenderness.  Neurological: She is alert and oriented to person, place, and time. She has normal reflexes. No cranial nerve deficit.  Skin: Skin is warm and dry.  Psychiatric: She has a normal mood and affect. Her behavior is normal. Judgment and thought content normal.  Vitals reviewed.     BP 130/88   Pulse 79   Temp (!) 97.1 F (36.2 C) (Oral)   Ht 5\' 4"  (1.626 m)   Wt 234 lb (106.1 kg)   BMI 40.17 kg/m      Assessment & Plan:  Jairy Angulo comes in today with chief complaint of Left eye red since Tuesday   Diagnosis and orders addressed:  1. Subconjunctival hemorrhage of left eye Avoid rubbing eye, straining Can use OTC eye drops to keep eye moisturized  RTO if symptoms worsen or do not improve  Jannifer Rodney, FNP

## 2018-11-14 ENCOUNTER — Other Ambulatory Visit: Payer: Self-pay | Admitting: Family Medicine

## 2018-11-19 ENCOUNTER — Encounter: Payer: Self-pay | Admitting: Family Medicine

## 2018-11-19 ENCOUNTER — Ambulatory Visit (INDEPENDENT_AMBULATORY_CARE_PROVIDER_SITE_OTHER): Payer: BLUE CROSS/BLUE SHIELD | Admitting: Family Medicine

## 2018-11-19 VITALS — BP 134/89 | HR 101 | Temp 97.2°F | Ht 64.0 in | Wt 235.4 lb

## 2018-11-19 DIAGNOSIS — J111 Influenza due to unidentified influenza virus with other respiratory manifestations: Secondary | ICD-10-CM

## 2018-11-19 DIAGNOSIS — R52 Pain, unspecified: Secondary | ICD-10-CM

## 2018-11-19 LAB — VERITOR FLU A/B WAIVED
INFLUENZA A: NEGATIVE
Influenza B: POSITIVE — AB

## 2018-11-19 MED ORDER — OSELTAMIVIR PHOSPHATE 75 MG PO CAPS: 75.0000 mg | ORAL_CAPSULE | Freq: Two times a day (BID) | ORAL | 0 refills | Status: DC

## 2018-11-19 NOTE — Progress Notes (Signed)
Chief Complaint  Patient presents with  . Cough    pt here today c/o cough/congestion, body aches    HPI  Patient presents today for patient presents with dry cough runny stuffy nose. Diffuse headache of moderate intensity. Patient also has chills and subjective fever. Body aches worst in the back but present in the legs, shoulders, and torso as well. Has sapped the energy to the point that of being unable to perform usual activities other than ADLs. Onset 2 days ago.   PMH: Smoking status noted ROS: Per HPI  Objective: BP 134/89   Pulse (!) 101   Temp (!) 97.2 F (36.2 C) (Oral)   Ht 5\' 4"  (1.626 m)   Wt 235 lb 6 oz (106.8 kg)   BMI 40.40 kg/m  Gen: NAD, alert, cooperative with exam HEENT: NCAT, EOMI, PERRL CV: RRR, good S1/S2, no murmur Resp: CTABL, no wheezes, non-labored Abd: SNTND, BS present, no guarding or organomegaly Ext: No edema, warm Neuro: Alert and oriented, No gross deficits  Assessment and plan:  1. Influenza with respiratory manifestation   2. Body aches     Meds ordered this encounter  Medications  . oseltamivir (TAMIFLU) 75 MG capsule    Sig: Take 1 capsule (75 mg total) by mouth 2 (two) times daily.    Dispense:  10 capsule    Refill:  0    Orders Placed This Encounter  Procedures  . Veritor Flu A/B Waived    Order Specific Question:   Source    Answer:   nasal    Follow up as needed.  Mechele Claude, MD

## 2019-02-02 ENCOUNTER — Other Ambulatory Visit: Payer: Self-pay | Admitting: Family Medicine

## 2019-02-03 ENCOUNTER — Telehealth: Payer: Self-pay | Admitting: Family Medicine

## 2019-02-03 NOTE — Telephone Encounter (Signed)
Patient called with complaints of elevated BP 180/120,. Headache, chest pain, dyspnea.  Advised to go to the ED

## 2019-02-03 NOTE — Telephone Encounter (Signed)
Okay thanks for the information 

## 2019-02-04 ENCOUNTER — Telehealth: Payer: Self-pay | Admitting: Family Medicine

## 2019-02-04 NOTE — Telephone Encounter (Addendum)
Scheduled patient with follow up with Dr. Louanne Skye regarding her blood pressure and cspine x-ray tomorrow.

## 2019-02-05 ENCOUNTER — Encounter: Payer: Self-pay | Admitting: Family Medicine

## 2019-02-05 ENCOUNTER — Ambulatory Visit (INDEPENDENT_AMBULATORY_CARE_PROVIDER_SITE_OTHER): Payer: BLUE CROSS/BLUE SHIELD | Admitting: Family Medicine

## 2019-02-05 ENCOUNTER — Ambulatory Visit: Payer: Self-pay | Admitting: Family Medicine

## 2019-02-05 ENCOUNTER — Other Ambulatory Visit: Payer: Self-pay

## 2019-02-05 VITALS — BP 118/75 | HR 80 | Temp 96.5°F | Ht 64.0 in | Wt 241.0 lb

## 2019-02-05 DIAGNOSIS — I10 Essential (primary) hypertension: Secondary | ICD-10-CM | POA: Diagnosis not present

## 2019-02-05 DIAGNOSIS — M5030 Other cervical disc degeneration, unspecified cervical region: Secondary | ICD-10-CM | POA: Diagnosis not present

## 2019-02-05 DIAGNOSIS — M50121 Cervical disc disorder at C4-C5 level with radiculopathy: Secondary | ICD-10-CM

## 2019-02-05 MED ORDER — CYCLOBENZAPRINE HCL 10 MG PO TABS: 10.0000 mg | ORAL_TABLET | Freq: Three times a day (TID) | ORAL | 0 refills | Status: DC | PRN

## 2019-02-05 MED ORDER — PREDNISONE 20 MG PO TABS: ORAL_TABLET | ORAL | 0 refills | Status: DC

## 2019-02-05 NOTE — Progress Notes (Signed)
BP 118/75   Pulse 80   Temp (!) 96.5 F (35.8 C) (Oral)   Ht 5' 4" (1.626 m)   Wt 241 lb (109.3 kg)   BMI 41.37 kg/m    Subjective:   Patient ID: Tina Chapman, female    DOB: [birth year], 53 y.o.   MRN: [id number]  HPI: Tina Chapman is a 53 y.o. female presenting on 02/05/2019 for Hospitalization Follow-up (03/23 HTN & neck pain - [facility])   HPI Hypertension and headache and neck pain follow-up Patient is coming in for hospital/ER follow-up for hypertension and neck pain.  She was in the ED at [facility] on 02/03/2019, 2 days ago, she was seen then for elevated blood pressure and headaches and neck pain on both sides of her neck extending down into the tops of her shoulders and down into her arms.  She has been taking Naprosyn for this and she did not feel like it is helping so now she started taking Toradol which is what they gave her from there and it does give her 3 hours of relief with and it wears off and she can only take it every 6 hours.  She is wondering if there is something else that can help more significantly with this.  Patient has a history of a cervical fusion in the past but this was in [place] and she has not established with a physician here for spinal issues.  She denies any numbness but she says generally in both sides of her body she just feels weak.  She says since leaving the hospital the headaches have been better but they pain in her neck is still there.  Her blood pressure was also elevated and they start her on lisinopril hydrochlorothiazide.  Relevant past medical, surgical, family and social history reviewed and updated as indicated. Interim medical history since our last visit reviewed. Allergies and medications reviewed and updated.  Review of Systems  Constitutional: Negative for chills and fever.  Eyes: Negative for visual disturbance.  Respiratory: Negative for chest tightness and shortness of breath.   Cardiovascular: Negative for  chest pain and leg swelling.  Musculoskeletal: Positive for arthralgias, myalgias and neck pain. Negative for back pain and gait problem.  Skin: Negative for rash.  Neurological: Positive for weakness and headaches. Negative for dizziness, light-headedness and numbness.  Psychiatric/Behavioral: Negative for agitation and behavioral problems.  All other systems reviewed and are negative.   Per HPI unless specifically indicated above   Allergies as of 02/05/2019   No Known Allergies     Medication List       Accurate as of February 05, 2019  2:50 PM. Always use your most recent med list.        cyclobenzaprine 10 MG tablet Commonly known as:  FLEXERIL Take 1 tablet (10 mg total) by mouth 3 (three) times daily as needed for muscle spasms.   ketorolac 10 MG tablet Commonly known as:  TORADOL Take 10 mg by mouth every 6 (six) hours as needed.   levothyroxine 112 MCG tablet Commonly known as:  SYNTHROID, LEVOTHROID Take 1 tablet (112 mcg total) by mouth daily.   lisinopril-hydrochlorothiazide 20-25 MG tablet Commonly known as:  PRINZIDE,ZESTORETIC Take 1 tablet by mouth daily.   naproxen 500 MG tablet Commonly known as:  NAPROSYN TAKE 1 TABLET(500 MG) BY MOUTH TWICE DAILY WITH A MEAL   predniSONE 20 MG tablet Commonly known as:  DELTASONE Take 3 tabs daily for 1 week, then 2  tabs daily for week 2, then 1 tab daily for week 3.        Objective:   BP 118/75   Pulse 80   Temp (!) 96.5 F (35.8 C) (Oral)   Ht 5' 4" (1.626 m)   Wt 241 lb (109.3 kg)   BMI 41.37 kg/m   Wt Readings from Last 3 Encounters:  02/05/19 241 lb (109.3 kg)  11/19/18 235 lb 6 oz (106.8 kg)  09/05/18 234 lb (106.1 kg)    Physical Exam Vitals signs and nursing note reviewed.  Constitutional:      General: She is not in acute distress.    Appearance: She is well-developed. She is not diaphoretic.  Eyes:     Conjunctiva/sclera: Conjunctivae normal.  Cardiovascular:     Rate and Rhythm:  Normal rate and regular rhythm.     Heart sounds: Normal heart sounds. No murmur.  Pulmonary:     Effort: Pulmonary effort is normal. No respiratory distress.     Breath sounds: Normal breath sounds. No wheezing.  Musculoskeletal: Normal range of motion.     Cervical back: She exhibits tenderness (Bilateral neck tenderness extending into the tops of the shoulders both midline and in the musculature). She exhibits normal range of motion.  Skin:    General: Skin is warm and dry.     Findings: No rash.  Neurological:     Mental Status: She is alert and oriented to person, place, and time.     Coordination: Coordination normal.  Psychiatric:        Behavior: Behavior normal.     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    -  Primary   Relevant Medications   predniSONE (DELTASONE) 20 MG tablet   cyclobenzaprine (FLEXERIL) 10 MG tablet   Other Relevant Orders   MR Cervical Spine Wo Contrast   Other cervical disc degeneration, unspecified cervical region       Relevant Medications   ketorolac (TORADOL) 10 MG tablet   predniSONE (DELTASONE) 20 MG tablet   cyclobenzaprine (FLEXERIL) 10 MG tablet   Other Relevant Orders   MR Cervical Spine Wo Contrast      X-ray at [facility] rocking [facility] show degeneration at C4-C5, patient has a previous fusion between C5 and C7, recommended home physical therapy exercises for the patient and will do anti-inflammatories and a muscle relaxer and try for an MRI but if not improved she will return in 6 to 8 weeks for  repeat if she is still having issues.    Follow up plan: Return in about 8 weeks (around 04/02/2019), or if symptoms worsen or fail to improve, for Follow-up cervical spine and hypertension.  Counseling provided for all of the vaccine components Orders Placed This Encounter  Procedures  . MR Cervical Spine Wo Contrast  . BMP8+EGFR    Caryl Pina, MD [facility] 02/05/2019, 2:50 PM

## 2019-02-06 LAB — BMP8+EGFR
BUN / CREAT RATIO: 27 — AB (ref 9–23)
BUN: 23 mg/dL (ref 6–24)
CO2: 23 mmol/L (ref 20–29)
Calcium: 10.2 mg/dL (ref 8.7–10.2)
Chloride: 97 mmol/L (ref 96–106)
Creatinine, Ser: 0.86 mg/dL (ref 0.57–1.00)
GFR calc non Af Amer: 78 mL/min/{1.73_m2} (ref >59)
GFR, EST AFRICAN AMERICAN: 90 mL/min/{1.73_m2} (ref >59)
Glucose: 118 mg/dL — ABNORMAL HIGH (ref 65–99)
Potassium: 3.7 mmol/L (ref 3.5–5.2)
Sodium: 139 mmol/L (ref 134–144)

## 2019-02-10 ENCOUNTER — Telehealth: Payer: Self-pay | Admitting: Family Medicine

## 2019-02-10 NOTE — Telephone Encounter (Signed)
Covering PCP- please advise  

## 2019-02-10 NOTE — Telephone Encounter (Signed)
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

## 2019-02-10 NOTE — Telephone Encounter (Signed)
Letter has been faxed per patient request.

## 2019-02-14 ENCOUNTER — Ambulatory Visit: Payer: BLUE CROSS/BLUE SHIELD | Admitting: Family Medicine

## 2019-02-20 ENCOUNTER — Telehealth: Payer: Self-pay

## 2019-03-10 ENCOUNTER — Telehealth: Payer: Self-pay | Admitting: *Deleted

## 2019-03-10 MED ORDER — LISINOPRIL-HYDROCHLOROTHIAZIDE 20-25 MG PO TABS: 1.0000 | ORAL_TABLET | Freq: Every day | ORAL | 0 refills | Status: DC

## 2019-03-10 NOTE — Telephone Encounter (Signed)
VM from patient needing RF on BP med has been out for 2 days. Med is listed as historical med Please advise

## 2019-03-10 NOTE — Telephone Encounter (Signed)
Sent refill for lisinopril- hctz Arville Care, MD [facility] 03/10/2019, 12:51 PM

## 2019-03-10 NOTE — Telephone Encounter (Signed)
Med was sent in - NA or no VM for pt.

## 2019-03-12 ENCOUNTER — Other Ambulatory Visit: Payer: Self-pay

## 2019-03-12 ENCOUNTER — Ambulatory Visit (INDEPENDENT_AMBULATORY_CARE_PROVIDER_SITE_OTHER): Payer: BLUE CROSS/BLUE SHIELD | Admitting: Family Medicine

## 2019-03-12 ENCOUNTER — Encounter: Payer: Self-pay | Admitting: Family Medicine

## 2019-03-12 VITALS — BP 104/68 | HR 85 | Temp 97.0°F | Ht 64.0 in | Wt 237.6 lb

## 2019-03-12 DIAGNOSIS — S39012D Strain of muscle, fascia and tendon of lower back, subsequent encounter: Secondary | ICD-10-CM

## 2019-03-12 DIAGNOSIS — M50121 Cervical disc disorder at C4-C5 level with radiculopathy: Secondary | ICD-10-CM

## 2019-03-12 DIAGNOSIS — M5030 Other cervical disc degeneration, unspecified cervical region: Secondary | ICD-10-CM | POA: Diagnosis not present

## 2019-03-12 MED ORDER — MELOXICAM 15 MG PO TABS: 15.0000 mg | ORAL_TABLET | Freq: Every day | ORAL | 3 refills | Status: DC

## 2019-03-12 MED ORDER — CYCLOBENZAPRINE HCL 10 MG PO TABS: 10.0000 mg | ORAL_TABLET | Freq: Three times a day (TID) | ORAL | 3 refills | Status: DC | PRN

## 2019-03-12 NOTE — Progress Notes (Signed)
BP 104/68   Pulse 85   Temp (!) 97 F (36.1 C) (Oral)   Ht 5\' 4"  (1.626 m)   Wt 237 lb 9.6 oz (107.8 kg)   BMI 40.78 kg/m    Subjective:   Patient ID: Tina Chapman, female    DOB: 1966/10/13, 53 y.o.   MRN: [id number]  HPI: Tina Chapman is a 53 y.o. female presenting on 03/12/2019 for Neck Pain (Patient states it has been on going ) and Back Pain   HPI Back pain neck pain Patient has a history of cervical disc fusion and is coming in today because she has had continued pain but it has been worse since she had a fall on April 13 which is a couple weeks ago.  She says she slipped on some water at work and has been going through [employer] but is coming here because things are about them proving and she would like to see if she could possibly go for an injection with an orthopedic.  She said when she slipped and fell at work she landed on her knees and strained her back and neck and has been having this significant issues since that time.  She says that she can only tolerate being at work for about 5 hours and she starts really having a lot of issues because of standing on her feet for prolonged periods.  She says it especially hurts on the right side of her back all the way up to the right side of her neck.  She says it is a sharp pain and rates it as severe.  She says it does cause her to have some difficulty sleeping at night because of the pain as well.  She is also been using blue ice which helps some and icy hot patches which helped some as well.  She did a short course of prednisone and it did help and now she is on meloxicam.  She also took the Flexeril which did help but is just not resolving.  Relevant past medical, surgical, family and social history reviewed and updated as indicated. Interim medical history since our last visit reviewed. Allergies and medications reviewed and updated.  Review of Systems  Constitutional: Negative for chills and fever.  Eyes:  Negative for visual disturbance.  Respiratory: Negative for chest tightness and shortness of breath.   Cardiovascular: Negative for chest pain and leg swelling.  Musculoskeletal: Positive for back pain, myalgias and neck pain. Negative for gait problem.  Skin: Negative for rash.  Neurological: Negative for weakness, light-headedness, numbness and headaches.  Psychiatric/Behavioral: Negative for agitation and behavioral problems.  All other systems reviewed and are negative.   Per HPI unless specifically indicated above   Allergies as of 03/12/2019   No Known Allergies     Medication List       Accurate as of March 12, 2019  8:49 AM. Always use your most recent med list.        cyclobenzaprine 10 MG tablet Commonly known as:  FLEXERIL Take 1 tablet (10 mg total) by mouth 3 (three) times daily as needed for muscle spasms.   levothyroxine 112 MCG tablet Commonly known as:  SYNTHROID Take 1 tablet (112 mcg total) by mouth daily.   lisinopril-hydrochlorothiazide 20-25 MG tablet Commonly known as:  ZESTORETIC Take 1 tablet by mouth daily.   meloxicam 15 MG tablet Commonly known as:  MOBIC Take 1 tablet (15 mg total) by mouth daily.   sertraline 50 MG tablet  Commonly known as:  ZOLOFT Take 50 mg by mouth daily.        Objective:   BP 104/68   Pulse 85   Temp (!) 97 F (36.1 C) (Oral)   Ht 5\' 4"  (1.626 m)   Wt 237 lb 9.6 oz (107.8 kg)   BMI 40.78 kg/m   Wt Readings from Last 3 Encounters:  03/12/19 237 lb 9.6 oz (107.8 kg)  02/05/19 241 lb (109.3 kg)  11/19/18 235 lb 6 oz (106.8 kg)    Physical Exam Vitals signs and nursing note reviewed.  Constitutional:      General: She is not in acute distress.    Appearance: She is well-developed. She is not diaphoretic.  Eyes:     Conjunctiva/sclera: Conjunctivae normal.  Musculoskeletal: Normal range of motion.     Lumbar back: She exhibits tenderness (Spinal tenderness in the paraspinal muscles along the right  side from lumbar up to cervical region) and spasm. She exhibits normal range of motion, no bony tenderness and no deformity.  Skin:    General: Skin is warm and dry.     Findings: No rash.  Neurological:     Mental Status: She is alert and oriented to person, place, and time.     Coordination: Coordination normal.  Psychiatric:        Behavior: Behavior normal.       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    -  Primary   Relevant Medications   meloxicam (MOBIC) 15 MG tablet   cyclobenzaprine (FLEXERIL) 10 MG tablet   Other Relevant Orders   Ambulatory referral to Physical Therapy   Ambulatory referral to Orthopedic Surgery   Back strain, subsequent encounter       Relevant Medications   meloxicam (MOBIC) 15 MG tablet   cyclobenzaprine (FLEXERIL) 10 MG tablet   Other Relevant Orders   Ambulatory referral to Physical Therapy   Ambulatory referral to Orthopedic Surgery   Other cervical disc degeneration, unspecified cervical region       Relevant Medications   meloxicam (MOBIC) 15 MG tablet   cyclobenzaprine (FLEXERIL) 10 MG tablet      Has a muscular component, likely also structural component based on her history, she heard of an orthopedic doing injections and would like to try that and have the reference from her friend.  We will do physical therapy referral as well Follow up plan: Return if symptoms worsen or fail to improve.  Counseling provided for all of the vaccine components Orders Placed This Encounter  Procedures  . Ambulatory referral to Physical Therapy  . Ambulatory referral to Orthopedic Surgery    Arville Care, MD [facility] 03/12/2019, 8:49 AM

## 2019-03-21 ENCOUNTER — Other Ambulatory Visit: Payer: Self-pay

## 2019-03-21 ENCOUNTER — Ambulatory Visit: Payer: BC Managed Care – PPO | Attending: Family Medicine | Admitting: Physical Therapy

## 2019-03-21 ENCOUNTER — Encounter: Payer: Self-pay | Admitting: Physical Therapy

## 2019-03-21 DIAGNOSIS — M542 Cervicalgia: Secondary | ICD-10-CM | POA: Diagnosis not present

## 2019-03-21 DIAGNOSIS — M546 Pain in thoracic spine: Secondary | ICD-10-CM | POA: Insufficient documentation

## 2019-03-21 NOTE — Therapy (Signed)
[facility] [address] [place], [place], [address] Phone: [phone]   Fax:  [phone]  Physical Therapy Evaluation  Patient Details  Name: Tina Chapman MRN: [id number] Date of Birth: [birth year] Referring Provider (PT): Arville Care, MD   Encounter Date: 03/21/2019  PT End of Session - 03/21/19 1047    Visit Number  1    Number of Visits  12    Date for PT Re-Evaluation  05/16/19    PT Start Time  0923    PT Stop Time  1015    PT Time Calculation (min)  52 min    Activity Tolerance  Patient tolerated treatment well    Behavior During Therapy  [facility] for tasks assessed/performed       Past Medical History:  Diagnosis Date  . Back pain   . Hypertension   . Sciatic nerve pain     Past Surgical History:  Procedure Laterality Date  . ABDOMINAL HYSTERECTOMY    . APPENDECTOMY    . CERVIX SURGERY    . CHOLECYSTECTOMY    . TONSILLECTOMY      There were no vitals filed for this visit.   Subjective Assessment - 03/21/19 1056    Subjective  COVID-19 screening performed prior to patient entering the building. Patient arrives to physical therapy with reports of cervical, bilateral upper trapezius, and upper thoracic pain that began in March of 2020. Patient reported pain may have been caused by moving a heavy box at work or moving furniture at home. Patient reports ongoing pain in cervical spine that refers to bilateral UTs and mid back. Patient has been working on light duty and has been restricted from lifting greater than 25 lbs but still has difficulties at work especially with standing. Patient states she fell on her knee with both arms stretched out at work on February 24, 2019 that exacerbated her pain. Pain at worst is 9/10 and pain at best is 0/10. Patient has difficulties with home activities and with sleeping.  Patient's goals are decrease pain, improve movement, improve strength, sleep without pain, and perform home and  work activities.    Pertinent History  HTN, previous cervical surgery     Limitations  Standing;House hold activities    How long can you stand comfortably?  less than 5 minutes    Patient Stated Goals  decrease pain and get back to work activities without pain    Currently in Pain?  Yes    Pain Score  8     Pain Location  Neck    Pain Orientation  Lower;Posterior    Pain Descriptors / Indicators  Sore;Aching    Pain Type  Acute pain    Pain Radiating Towards  upper trapezius bilaterally    Pain Onset  1 to 4 weeks ago    Pain Frequency  Intermittent    Aggravating Factors   "standing too many hours at work"    Pain Relieving Factors  "Icy hot, not standing too many hours, sitting"    Effect of Pain on Daily Activities  "can't work 10 hrs standing at work because of pain."         [facility] PT Assessment - 03/21/19 0001      Assessment   Medical Diagnosis  Cervical disc disorder at C4-C5 with radiculopathy, Back strain, subsequent disorder    Referring Provider (PT)  Arville Care, MD    Onset Date/Surgical Date  --   "few months ago" exacerbation 02/24/2019  Next MD Visit  to be determined    Prior Therapy  no      Precautions   Precautions  None      Restrictions   Weight Bearing Restrictions  No      Balance Screen   Has the patient fallen in the past 6 months  Yes    How many times?  1    Has the patient had a decrease in activity level because of a fear of falling?   No    Is the patient reluctant to leave their home because of a fear of falling?   No      Home Environment   Living Environment  Private residence      Prior Function   Level of Independence  Independent      Posture/Postural Control   Posture/Postural Control  Postural limitations    Postural Limitations  Rounded Shoulders;Forward head      ROM / Strength   AROM / PROM / Strength  AROM      AROM   Overall AROM   Deficits;Due to pain    Overall AROM Comments  "pressure" with all cervical  movements    AROM Assessment Site  Cervical    Cervical Flexion  30    Cervical Extension  20    Cervical - Right Side Bend  29    Cervical - Left Side Bend  23    Cervical - Right Rotation  50    Cervical - Left Rotation  48      Palpation   Palpation comment  Very tender to palpation to suboccipitals, cervical paraspinals, UTs, rhomboids.  increased muscle tone in bilateral UTs.                 Objective measurements completed on examination: See above findings.              PT Education - 03/21/19 1043    Education Details  scapular retractions, chin tucks, cervical rotation, upper trap stretch, corner stretch    Person(s) Educated  Patient    Methods  Explanation;Demonstration;Handout    Comprehension  Verbalized understanding;Returned demonstration          PT Long Term Goals - 03/21/19 1106      PT LONG TERM GOAL #1   Title  Patient will be independent with HEP.    Time  6    Period  Weeks    Status  New      PT LONG TERM GOAL #2   Title  Patient will demonstrate 55+ degrees of cervical rotation to improve ability to scan environment.    Time  6    Period  Weeks    Status  New      PT LONG TERM GOAL #3   Title  Patient will report ability to perform ADLs and work activities with cervical pain less than 4/10.     Time  6    Period  Weeks    Status  New      PT LONG TERM GOAL #4   Title  Patient will report ability to stand for 30+ minutes for work activites with cervical pain less than 4/10.    Time  6    Period  Weeks    Status  New             Plan - 03/21/19 1051    Clinical Impression Statement  Patient is a 53 year old female who presents  to physical therapy with cervical pain, decreased cervical ROM, and postural deficits that began in March 2020. Patient demonstrates rounded shoulders and her head in a forward and flexed position in sitting. Patient very tender to palpation to bilateral cervical paraspinals, UTs, rhomboids  and suboccipitals. Patient and PT reviewed HEP and educated on importance of performing to optimize physical therapy. Patient reported understanding. Patient would benefit from skilled physical therapy to address deficits and address patient's goals.     Personal Factors and Comorbidities  Comorbidity 1    Comorbidities  HTN    Examination-Activity Limitations  Lift;Stand    Stability/Clinical Decision Making  Evolving/Moderate complexity    Clinical Decision Making  Low    Rehab Potential  Fair    PT Frequency  2x / week    PT Duration  6 weeks    PT Treatment/Interventions  ADLs/Self Care Home Management;Electrical Stimulation;Moist Heat;Traction;Ultrasound;Cryotherapy;Neuromuscular re-education;Manual techniques;Therapeutic exercise;Therapeutic activities;Patient/family education;Passive range of motion;Dry needling;Spinal Manipulations    PT Next Visit Plan  gentle AROM of cervical spine, postural exercises, STW/M to cervical spine, UT and upper back. modalities PRN for pain relief.     PT Home Exercise Plan  See pt education section    Consulted and Agree with Plan of Care  Patient       Patient will benefit from skilled therapeutic intervention in order to improve the following deficits and impairments:  Pain, Postural dysfunction, Decreased activity tolerance, Decreased range of motion  Visit Diagnosis: Cervicalgia  Pain in thoracic spine     Problem List Patient Active Problem List   Diagnosis Date Noted  . Anxiety 08/29/2018  . Hypertension 03/29/2018  . Hypothyroidism (acquired) 03/29/2018  . Morbid obesity (HCC) 03/29/2018  . Chronic lumbar pain 03/29/2018  . GERD (gastroesophageal reflux disease) 03/29/2018   Guss Bunde, PT, DPT 03/21/2019, 12:31 PM  [facility] [address] [place], [place], [address] Phone: [phone]   Fax:  [phone]  Name: Alayasia Breeding MRN: [id number] Date of Birth: [birth year]

## 2019-03-28 ENCOUNTER — Encounter: Payer: Self-pay | Admitting: Family Medicine

## 2019-03-28 ENCOUNTER — Other Ambulatory Visit: Payer: Self-pay

## 2019-03-28 ENCOUNTER — Ambulatory Visit (INDEPENDENT_AMBULATORY_CARE_PROVIDER_SITE_OTHER): Payer: BLUE CROSS/BLUE SHIELD | Admitting: Family Medicine

## 2019-03-28 ENCOUNTER — Ambulatory Visit: Payer: BC Managed Care – PPO | Admitting: Physical Therapy

## 2019-03-28 VITALS — BP 87/57 | HR 114 | Temp 97.6°F | Ht 64.0 in | Wt 237.6 lb

## 2019-03-28 DIAGNOSIS — M546 Pain in thoracic spine: Secondary | ICD-10-CM

## 2019-03-28 DIAGNOSIS — R29898 Other symptoms and signs involving the musculoskeletal system: Secondary | ICD-10-CM | POA: Diagnosis not present

## 2019-03-28 DIAGNOSIS — M5442 Lumbago with sciatica, left side: Secondary | ICD-10-CM

## 2019-03-28 DIAGNOSIS — M542 Cervicalgia: Secondary | ICD-10-CM

## 2019-03-28 DIAGNOSIS — M50121 Cervical disc disorder at C4-C5 level with radiculopathy: Secondary | ICD-10-CM

## 2019-03-28 DIAGNOSIS — Z029 Encounter for administrative examinations, unspecified: Secondary | ICD-10-CM

## 2019-03-28 DIAGNOSIS — I959 Hypotension, unspecified: Secondary | ICD-10-CM | POA: Diagnosis not present

## 2019-03-28 DIAGNOSIS — R000 Tachycardia, unspecified: Secondary | ICD-10-CM | POA: Diagnosis not present

## 2019-03-28 DIAGNOSIS — G8929 Other chronic pain: Secondary | ICD-10-CM

## 2019-03-28 MED ORDER — LISINOPRIL-HYDROCHLOROTHIAZIDE 20-25 MG PO TABS: 0.5000 | ORAL_TABLET | Freq: Every day | ORAL | 0 refills | Status: DC

## 2019-03-28 NOTE — Progress Notes (Signed)
BP (!) 87/57   Pulse (!) 114   Temp 97.6 F (36.4 C) (Oral)   Ht 5\' 4"  (1.626 m)   Wt 237 lb 9.6 oz (107.8 kg)   BMI 40.78 kg/m    Subjective:   Patient ID: Tina Chapman, female    DOB: 05-Oct-1966, 53 y.o.   MRN: [id number]  HPI: Tina Chapman is a 53 y.o. female presenting on 03/28/2019 for FMLA paper work   HPI Tachycardia and hypotension Patient is coming in today for another reason but then the office her blood pressure was found to be 87/57 and this was confirmed with manual blood pressure and her heart rate was 114 and sounded regular like sinus tachycardia but will do an EKG to confirm.  She denies any chest pain or palpitations or flutters.  She denies any lightheadedness or dizziness today which is generally feels weaker.  Her blood pressure is down today.  She denies any headaches or chest pain  Low back pain with b/l lower extremity weakness Patient has been having low back pain since earlier in the year about 2 to 3 months and neck pain and now she is getting weakness in her legs.  She says over working in her job has led to a lot of that and she has gone through [employer]. and they are working with her but need an FMLA for her today.  She is doing therapy 3 times a week through [employer]. and 1 time a week through [place] for her back and her lower extremity and she feels like it is improving and she will continue with it but she just needs some time to reduce workload so that she can help.  She says she is weak in both legs and that is pretty even across both legs.  Relevant past medical, surgical, family and social history reviewed and updated as indicated. Interim medical history since our last visit reviewed. Allergies and medications reviewed and updated.  Review of Systems  Constitutional: Negative for chills and fever.  Eyes: Negative for visual disturbance.  Respiratory: Negative for chest tightness and shortness of breath.   Cardiovascular: Negative  for chest pain and leg swelling.  Musculoskeletal: Positive for arthralgias and back pain. Negative for gait problem.  Skin: Negative for rash.  Neurological: Positive for weakness. Negative for light-headedness and headaches.  Psychiatric/Behavioral: Negative for agitation and behavioral problems.  All other systems reviewed and are negative.   Per HPI unless specifically indicated above   Allergies as of 03/28/2019   No Known Allergies     Medication List       Accurate as of Mar 28, 2019  2:31 PM. If you have any questions, ask your nurse or doctor.        cyclobenzaprine 10 MG tablet Commonly known as:  FLEXERIL Take 1 tablet (10 mg total) by mouth 3 (three) times daily as needed for muscle spasms.   levothyroxine 112 MCG tablet Commonly known as:  SYNTHROID Take 1 tablet (112 mcg total) by mouth daily.   lisinopril-hydrochlorothiazide 20-25 MG tablet Commonly known as:  ZESTORETIC Take 1 tablet by mouth daily.   meloxicam 15 MG tablet Commonly known as:  MOBIC Take 1 tablet (15 mg total) by mouth daily.   sertraline 50 MG tablet Commonly known as:  ZOLOFT Take 50 mg by mouth daily.        Objective:   BP (!) 87/57   Pulse (!) 114   Temp 97.6 F (36.4  C) (Oral)   Ht 5\' 4"  (1.626 m)   Wt 237 lb 9.6 oz (107.8 kg)   BMI 40.78 kg/m   Wt Readings from Last 3 Encounters:  03/28/19 237 lb 9.6 oz (107.8 kg)  03/12/19 237 lb 9.6 oz (107.8 kg)  02/05/19 241 lb (109.3 kg)    Physical Exam Vitals signs and nursing note reviewed.  Constitutional:      General: She is not in acute distress.    Appearance: She is well-developed. She is not diaphoretic.  Eyes:     Conjunctiva/sclera: Conjunctivae normal.  Cardiovascular:     Rate and Rhythm: Regular rhythm. Tachycardia present.     Heart sounds: Normal heart sounds. No murmur.  Pulmonary:     Effort: Pulmonary effort is normal. No respiratory distress.     Breath sounds: Normal breath sounds. No wheezing.   Musculoskeletal: Normal range of motion.        General: Tenderness (Lumbar tenderness, negative straight leg raise, no weakness noted on exam) present. No deformity.  Skin:    General: Skin is warm and dry.     Findings: No rash.  Neurological:     Mental Status: She is alert and oriented to person, place, and time.     Coordination: Coordination normal.  Psychiatric:        Behavior: Behavior normal.     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  Counseling provided for all of the vaccine components No orders of the defined types were placed in this encounter.   Arville CareJoshua , MD [facility] 03/28/2019, 2:31 PM

## 2019-03-28 NOTE — Therapy (Signed)
[facility] [address] [place], [place], [address] Phone: [phone]   Fax:  [phone]  Physical Therapy Treatment  Patient Details  Name: Tina Chapman MRN: [id number] Date of Birth: [birth year] Referring Provider (PT): Arville CareJoshua Dettinger, MD   Encounter Date: 03/28/2019  PT End of Session - 03/28/19 0950    Visit Number  2    Number of Visits  12    Date for PT Re-Evaluation  05/16/19    PT Start Time  0944    PT Stop Time  1038    PT Time Calculation (min)  54 min    Activity Tolerance  Patient tolerated treatment well    Behavior During Therapy  [facility] for tasks assessed/performed       Past Medical History:  Diagnosis Date  . Back pain   . Hypertension   . Sciatic nerve pain     Past Surgical History:  Procedure Laterality Date  . ABDOMINAL HYSTERECTOMY    . APPENDECTOMY    . CERVIX SURGERY    . CHOLECYSTECTOMY    . TONSILLECTOMY      There were no vitals filed for this visit.  Subjective Assessment - 03/28/19 0950    Subjective  COVID-19 screening performed prior to patient entering the building. Patient arrives stating her pain has gotten better and she is compliant with exercises provided as HEP. Patient later reported that she felt left UE numbness this morning but dissipated before she came to therapy.     Pertinent History  HTN, previous cervical surgery     Limitations  Standing;House hold activities    How long can you stand comfortably?  less than 5 minutes    Patient Stated Goals  decrease pain and get back to work activities without pain    Currently in Pain?  Yes   did not provide number on pain scale   Pain Location  Neck    Pain Orientation  Left;Posterior    Pain Descriptors / Indicators  Sore;Aching    Pain Type  Acute pain    Pain Onset  1 to 4 weeks ago    Pain Frequency  Intermittent         [facility] PT Assessment - 03/28/19 0001      Assessment   Medical Diagnosis  Cervical disc  disorder at C4-C5 with radiculopathy, Back strain, subsequent disorder    Referring Provider (PT)  Arville CareJoshua Dettinger, MD    Next MD Visit  to be determined    Prior Therapy  no                   [facility] - 03/28/19 0001      Exercises   Exercises  Shoulder;Neck      Neck Exercises: Machines for Strengthening   Nustep  Level 2 x10 minutes      Shoulder Exercises: Standing   Row  Strengthening;Both;20 reps    Row Limitations  Pink XTS      Modalities   Modalities  Electrical Stimulation;Moist Heat;Ultrasound      Moist Heat Therapy   Number Minutes Moist Heat  15 Minutes    Moist Heat Location  Cervical      Electrical Stimulation   Electrical Stimulation Location  bilateral UTs    Electrical Stimulation Action  IFC    Electrical Stimulation Parameters  80-150 hz x15 mins    Electrical Stimulation Goals  Pain      Ultrasound  Ultrasound Location  bilateral UTs and levator scapulae    Ultrasound Parameters  Combo US/e-stim 100% 1.5 w/cm2, 1 mhz x10 mins    Ultrasound Goals  Pain      Manual Therapy   Manual Therapy  Soft tissue mobilization    Soft tissue mobilization  STW/M with light to medium pressure to decrease muscle tone.                  PT Long Term Goals - 03/21/19 1106      PT LONG TERM GOAL #1   Title  Patient will be independent with HEP.    Time  6    Period  Weeks    Status  New      PT LONG TERM GOAL #2   Title  Patient will demonstrate 55+ degrees of cervical rotation to improve ability to scan environment.    Time  6    Period  Weeks    Status  New      PT LONG TERM GOAL #3   Title  Patient will report ability to perform ADLs and work activities with cervical pain less than 4/10.     Time  6    Period  Weeks    Status  New      PT LONG TERM GOAL #4   Title  Patient will report ability to stand for 30+ minutes for work activites with cervical pain less than 4/10.    Time  6    Period  Weeks     Status  New            Plan - 03/28/19 1030    Clinical Impression Statement  Patient responded to therapy well with minimal reports of increased pain during exercises. Patient provided with intermittent cuing for proper form and to prevent leaning backward with XTS row. Patient was able to tolerate combo e-stim/US and noted with decrease in muscle tone upon palpation. No adverse affects noted upon removal of modalities.     Personal Factors and Comorbidities  Comorbidity 1    Comorbidities  HTN    Examination-Activity Limitations  Lift;Stand    Stability/Clinical Decision Making  Evolving/Moderate complexity    Clinical Decision Making  Low    Rehab Potential  Fair    PT Frequency  2x / week    PT Duration  6 weeks    PT Treatment/Interventions  ADLs/Self Care Home Management;Electrical Stimulation;Moist Heat;Traction;Ultrasound;Cryotherapy;Neuromuscular re-education;Manual techniques;Therapeutic exercise;Therapeutic activities;Patient/family education;Passive range of motion;Dry needling;Spinal Manipulations    PT Next Visit Plan  gentle AROM of cervical spine, postural exercises, STW/M to cervical spine, UT and upper back. modalities PRN for pain relief.     PT Home Exercise Plan  See pt education section    Consulted and Agree with Plan of Care  Patient       Patient will benefit from skilled therapeutic intervention in order to improve the following deficits and impairments:  Pain, Postural dysfunction, Decreased activity tolerance, Decreased range of motion  Visit Diagnosis: Cervicalgia  Pain in thoracic spine     Problem List Patient Active Problem List   Diagnosis Date Noted  . Anxiety 08/29/2018  . Hypertension 03/29/2018  . Hypothyroidism (acquired) 03/29/2018  . Morbid obesity (HCC) 03/29/2018  . Chronic lumbar pain 03/29/2018  . GERD (gastroesophageal reflux disease) 03/29/2018   Guss Bunde, PT, DPT 03/28/2019, 10:46 AM  [facility] [address] [place], [place], [address] Phone: [phone]  Fax:  [phone]  Name: Tina Chapman MRN: [id number] Date of Birth: 1966/04/14

## 2019-04-03 ENCOUNTER — Encounter: Payer: Self-pay | Admitting: Family Medicine

## 2019-04-04 ENCOUNTER — Ambulatory Visit: Payer: BC Managed Care – PPO | Admitting: Physical Therapy

## 2019-04-11 ENCOUNTER — Ambulatory Visit: Payer: BC Managed Care – PPO | Admitting: Physical Therapy

## 2019-04-11 ENCOUNTER — Other Ambulatory Visit: Payer: Self-pay

## 2019-04-11 DIAGNOSIS — M542 Cervicalgia: Secondary | ICD-10-CM | POA: Diagnosis not present

## 2019-04-11 DIAGNOSIS — M546 Pain in thoracic spine: Secondary | ICD-10-CM

## 2019-04-11 NOTE — Therapy (Signed)
[facility] [address] [place], [place], [address] Phone: [phone]   Fax:  [phone]  Physical Therapy Treatment  Patient Details  Name: Tina Chapman MRN: [id number] Date of Birth: 08-Dec-1965 Referring Provider (PT): Arville Care, MD   Encounter Date: 04/11/2019  PT End of Session - 04/11/19 1141    Visit Number  3    Number of Visits  12    Date for PT Re-Evaluation  05/16/19    PT Start Time  1015    PT Stop Time  1106    PT Time Calculation (min)  51 min    Activity Tolerance  Patient tolerated treatment well    Behavior During Therapy  [facility] for tasks assessed/performed       Past Medical History:  Diagnosis Date  . Back pain   . Hypertension   . Sciatic nerve pain     Past Surgical History:  Procedure Laterality Date  . ABDOMINAL HYSTERECTOMY    . APPENDECTOMY    . CERVIX SURGERY    . CHOLECYSTECTOMY    . TONSILLECTOMY      There were no vitals filed for this visit.  Subjective Assessment - 04/11/19 1016    Subjective  COVID-19 screening performed prior to patient entering the building. Patient reports pain was bad this morning but took medicine and it has decreased since taking it.     Pertinent History  HTN, previous cervical surgery     Limitations  Standing;House hold activities    How long can you stand comfortably?  less than 5 minutes    Patient Stated Goals  decrease pain and get back to work activities without pain    Currently in Pain?  Yes   did not provide number on pain scale.   Pain Location  Neck    Pain Orientation  Left;Posterior    Pain Descriptors / Indicators  Burning;Sharp    Pain Type  Acute pain    Pain Onset  1 to 4 weeks ago    Pain Frequency  Intermittent         [facility] PT Assessment - 04/11/19 0001      Assessment   Medical Diagnosis  Cervical disc disorder at C4-C5 with radiculopathy, Back strain, subsequent disorder    Referring Provider (PT)  Arville Care,  MD    Next MD Visit  to be determined    Prior Therapy  no                   [facility] Adult PT Treatment/Exercise - 04/11/19 0001      Exercises   Exercises  Shoulder;Neck      Neck Exercises: Machines for Strengthening   UBE (Upper Arm Bike)  120 RPM x8 mins       Shoulder Exercises: Standing   Horizontal ABduction  AROM;Both;20 reps    Horizontal ABduction Limitations  with bolster behind head long ways    Extension  Strengthening;Both    Extension Limitations  green XTS    Row  Strengthening;Both;20 reps    Row Limitations  green XTS      Modalities   Modalities  Electrical Stimulation;Moist Heat;Ultrasound      Moist Heat Therapy   Number Minutes Moist Heat  15 Minutes    Moist Heat Location  Cervical      Electrical Stimulation   Electrical Stimulation Location  bilateral UTs    Electrical Stimulation Action  IFC    Electrical Stimulation Parameters  80-150 hz x15 mins    Electrical Stimulation Goals  Pain      Ultrasound   Ultrasound Location  bilateral UTs and levator scap    Ultrasound Parameters  Combo US/e-stim 1.5 w/cm2, 100% ,1 mhz, x8 mins    Ultrasound Goals  Pain                  PT Long Term Goals - 03/21/19 1106      PT LONG TERM GOAL #1   Title  Patient will be independent with HEP.    Time  6    Period  Weeks    Status  New      PT LONG TERM GOAL #2   Title  Patient will demonstrate 55+ degrees of cervical rotation to improve ability to scan environment.    Time  6    Period  Weeks    Status  New      PT LONG TERM GOAL #3   Title  Patient will report ability to perform ADLs and work activities with cervical pain less than 4/10.     Time  6    Period  Weeks    Status  New      PT LONG TERM GOAL #4   Title  Patient will report ability to stand for 30+ minutes for work activites with cervical pain less than 4/10.    Time  6    Period  Weeks    Status  New            Plan - 04/11/19 1143    Clinical  Impression Statement  Patient was able to tolerate treatment well with no reports of increased pain during exercises. Patient required intermittent cuing for form with XTS and demonstrated improved form after cue. A decrease in bilateral UT tone noted at the end of combo e-stim/US. Normal response to modalities upon removal.     Personal Factors and Comorbidities  Comorbidity 1    Comorbidities  HTN    Examination-Activity Limitations  Lift;Stand    Stability/Clinical Decision Making  Evolving/Moderate complexity    Clinical Decision Making  Low    Rehab Potential  Fair    PT Frequency  2x / week    PT Duration  6 weeks    PT Treatment/Interventions  ADLs/Self Care Home Management;Electrical Stimulation;Moist Heat;Traction;Ultrasound;Cryotherapy;Neuromuscular re-education;Manual techniques;Therapeutic exercise;Therapeutic activities;Patient/family education;Passive range of motion;Dry needling;Spinal Manipulations    PT Next Visit Plan  gentle AROM of cervical spine, postural exercises, STW/M to cervical spine, UT and upper back. modalities PRN for pain relief.     Consulted and Agree with Plan of Care  Patient       Patient will benefit from skilled therapeutic intervention in order to improve the following deficits and impairments:  Pain, Postural dysfunction, Decreased activity tolerance, Decreased range of motion  Visit Diagnosis: Cervicalgia  Pain in thoracic spine     Problem List Patient Active Problem List   Diagnosis Date Noted  . Anxiety 08/29/2018  . Hypertension 03/29/2018  . Hypothyroidism (acquired) 03/29/2018  . Morbid obesity (HCC) 03/29/2018  . Chronic lumbar pain 03/29/2018  . GERD (gastroesophageal reflux disease) 03/29/2018   Guss BundeKrystle Dennise Bamber, PT, DPT 04/11/2019, 12:02 PM  [facility] [address] [place], [place], [address] Phone: [phone]   Fax:  [phone]  Name: Tina Chapman MRN: [id number] Date  of Birth: 1966/01/27

## 2019-04-18 ENCOUNTER — Other Ambulatory Visit: Payer: Self-pay

## 2019-04-18 ENCOUNTER — Ambulatory Visit: Payer: BC Managed Care – PPO | Attending: Family Medicine | Admitting: Physical Therapy

## 2019-04-18 ENCOUNTER — Encounter: Payer: Self-pay | Admitting: Physical Therapy

## 2019-04-18 DIAGNOSIS — M542 Cervicalgia: Secondary | ICD-10-CM

## 2019-04-18 DIAGNOSIS — M546 Pain in thoracic spine: Secondary | ICD-10-CM | POA: Diagnosis present

## 2019-04-18 NOTE — Therapy (Signed)
[facility] [address] [place], [place], [address] Phone: [phone]   Fax:  [phone]  Physical Therapy Treatment  Patient Details  Name: Tina Chapman MRN: [id number] Date of Birth: Nov 24, 1965 Referring Provider (PT): Arville Care, MD   Encounter Date: 04/18/2019  PT End of Session - 04/18/19 0913    Visit Number  4    Number of Visits  12    Date for PT Re-Evaluation  05/16/19    PT Start Time  0911    PT Stop Time  0959    PT Time Calculation (min)  48 min    Activity Tolerance  Patient tolerated treatment well    Behavior During Therapy  [facility] for tasks assessed/performed       Past Medical History:  Diagnosis Date  . Back pain   . Hypertension   . Sciatic nerve pain     Past Surgical History:  Procedure Laterality Date  . ABDOMINAL HYSTERECTOMY    . APPENDECTOMY    . CERVIX SURGERY    . CHOLECYSTECTOMY    . TONSILLECTOMY      There were no vitals filed for this visit.  Subjective Assessment - 04/18/19 0912    Subjective  COVID 19 screening performed on patient upon arrival. Patient reports increased pain yesterday from back of cervical spine thorugh thoracic spine. Reports pain is much worse with prolonged standing. No current pain now as she took medication but the medication was not helping her pain yesterday.    Pertinent History  HTN, previous cervical surgery     Limitations  Standing;House hold activities    How long can you stand comfortably?  less than 5 minutes    Patient Stated Goals  decrease pain and get back to work activities without pain    Currently in Pain?  No/denies         [facility] PT Assessment - 04/18/19 0001      Assessment   Medical Diagnosis  Cervical disc disorder at C4-C5 with radiculopathy, Back strain, subsequent disorder    Referring Provider (PT)  Arville Care, MD    Next MD Visit  to be determined    Prior Therapy  no      Precautions   Precautions  None      Restrictions   Weight Bearing Restrictions  No                   [facility] Adult PT Treatment/Exercise - 04/18/19 0001      Neck Exercises: Machines for Strengthening   UBE (Upper Arm Bike)  90 RPM x6 min      Shoulder Exercises: Standing   Flexion  AROM;Both;20 reps    ABduction  AROM;Both;12 reps   limited by "crackling" of R shoulder   Extension  Strengthening;Both;20 reps;Limitations    Extension Limitations  Green XTS    Row  Strengthening;Both;20 reps    Row Limitations  Green XTS    Other Standing Exercises  B scaption x20 reps    Other Standing Exercises  Attempted snow angels but limited by shoulder discomfort      Modalities   Modalities  Electrical Stimulation;Moist Heat;Ultrasound      Moist Heat Therapy   Number Minutes Moist Heat  15 Minutes    Moist Heat Location  Cervical      Electrical Stimulation   Electrical Stimulation Location  bilateral UTs    Electrical Stimulation Action  IFC    Electrical Stimulation Parameters  80-150 hz x15 min    Electrical Stimulation Goals  Pain;Tone      Ultrasound   Ultrasound Location  B UT/cervical paraspinals    Ultrasound Parameters  1.5 w/cm2, 100%, 1 mhz x10 min    Ultrasound Goals  Pain      Manual Therapy   Manual Therapy  Soft tissue mobilization    Soft tissue mobilization  STW to B UT, levator scapula, cervical paraspinals to reduce tone and pain                  PT Long Term Goals - 04/18/19 0947      PT LONG TERM GOAL #1   Title  Patient will be independent with HEP.    Time  6    Period  Weeks    Status  Achieved      PT LONG TERM GOAL #2   Title  Patient will demonstrate 55+ degrees of cervical rotation to improve ability to scan environment.    Time  6    Period  Weeks    Status  On-going      PT LONG TERM GOAL #3   Title  Patient will report ability to perform ADLs and work activities with cervical pain less than 4/10.     Time  6    Period  Weeks    Status  On-going       PT LONG TERM GOAL #4   Title  Patient will report ability to stand for 30+ minutes for work activites with cervical pain less than 4/10.    Time  6    Period  Weeks    Status  On-going            Plan - 04/18/19 0950    Clinical Impression Statement  Patient presented in clinic with reports of increased pain yesterday but no current cervical pain. Patient still greatly limited by prolonged standing for ADLs/cleaning within her home as well as her job responsibilities due to pain. Standing postural strengthening exercises limited moderately by shoulder discomfort especially abduction. Patient very tender over B UT, levator scapula with moderate muscle tightness palpable as well. Normal modalities response noted following removal of the modalities.    Personal Factors and Comorbidities  Comorbidity 1    Comorbidities  HTN    Examination-Activity Limitations  Lift;Stand    Stability/Clinical Decision Making  Evolving/Moderate complexity    Rehab Potential  Fair    PT Frequency  2x / week    PT Duration  6 weeks    PT Treatment/Interventions  ADLs/Self Care Home Management;Electrical Stimulation;Moist Heat;Traction;Ultrasound;Cryotherapy;Neuromuscular re-education;Manual techniques;Therapeutic exercise;Therapeutic activities;Patient/family education;Passive range of motion;Dry needling;Spinal Manipulations    PT Next Visit Plan  gentle AROM of cervical spine, postural exercises, STW/M to cervical spine, UT and upper back. modalities PRN for pain relief.     PT Home Exercise Plan  See pt education section    Consulted and Agree with Plan of Care  Patient       Patient will benefit from skilled therapeutic intervention in order to improve the following deficits and impairments:  Pain, Postural dysfunction, Decreased activity tolerance, Decreased range of motion  Visit Diagnosis: Cervicalgia  Pain in thoracic spine     Problem List Patient Active Problem List   Diagnosis Date  Noted  . Anxiety 08/29/2018  . Hypertension 03/29/2018  . Hypothyroidism (acquired) 03/29/2018  . Morbid obesity (HCC) 03/29/2018  . Chronic lumbar pain 03/29/2018  . GERD (gastroesophageal  reflux disease) 03/29/2018    Marvell FullerKelsey P Kennon, PTA 04/18/2019, 10:07 AM  [facility] [address] [place], [place], [id number] Phone: [phone]   Fax:  [phone]  Name: Tina Chapman MRN: [id number] Date of Birth: 27-Apr-1966

## 2019-04-25 ENCOUNTER — Encounter: Payer: Self-pay | Admitting: Physical Therapy

## 2019-04-25 ENCOUNTER — Other Ambulatory Visit: Payer: Self-pay

## 2019-04-25 ENCOUNTER — Ambulatory Visit: Payer: BC Managed Care – PPO | Admitting: Physical Therapy

## 2019-04-25 DIAGNOSIS — M542 Cervicalgia: Secondary | ICD-10-CM

## 2019-04-25 DIAGNOSIS — M546 Pain in thoracic spine: Secondary | ICD-10-CM

## 2019-04-25 NOTE — Therapy (Signed)
[facility] [address] [place], [place], [address] Phone: [phone]   Fax:  [phone]  Physical Therapy Treatment  Patient Details  Name: Tina Chapman MRN: [id number] Date of Birth: Sep 12, 1966 Referring Provider (PT): Arville CareJoshua Dettinger, MD   Encounter Date: 04/25/2019  PT End of Session - 04/25/19 0914    Visit Number  5    Number of Visits  12    Date for PT Re-Evaluation  05/16/19    PT Start Time  0809    PT Stop Time  0902    PT Time Calculation (min)  53 min    Activity Tolerance  Patient tolerated treatment well       Past Medical History:  Diagnosis Date  . Back pain   . Hypertension   . Sciatic nerve pain     Past Surgical History:  Procedure Laterality Date  . ABDOMINAL HYSTERECTOMY    . APPENDECTOMY    . CERVIX SURGERY    . CHOLECYSTECTOMY    . TONSILLECTOMY      There were no vitals filed for this visit.  Subjective Assessment - 04/25/19 0821    Subjective  COVID 19 screening performed on patient upon arrival. Reports feeling good today but she still has a lot of pain.    Pertinent History  HTN, previous cervical surgery     Limitations  Standing;House hold activities    How long can you stand comfortably?  less than 5 minutes    Patient Stated Goals  decrease pain and get back to work activities without pain    Currently in Pain?  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, Back strain, subsequent disorder    Referring Provider (PT)  Arville CareJoshua Dettinger, MD    Next MD Visit  to be determined    Prior Therapy  no      Precautions   Precautions  None                   [facility] Adult PT Treatment/Exercise -  04/25/19 0001      Neck Exercises: Machines for Strengthening   UBE (Upper Arm Bike)  120 RPM x8 minutes      Shoulder Exercises: Standing   Horizontal ABduction  AROM;Both;20 reps    Horizontal ABduction Limitations  with bolster behind head long ways    Extension  Strengthening;Both;20 reps;Limitations    Extension Limitations  Green XTS    Diagonals  Strengthening;Both;20 reps    Diagonals Limitations  Green XTS    Other Standing Exercises  lat pull downs x20 green XTS      Shoulder Exercises: ROM/Strengthening   X to V Arms  x20 with bolster for tactile cue for chin tuck      Modalities   Modalities  Electrical Stimulation;Moist Heat;Ultrasound      Moist Heat Therapy   Number Minutes Moist Heat  15 Minutes      Electrical Stimulation   Electrical Stimulation Location  bilateral UTs    Electrical Stimulation Action  IFC    Electrical Stimulation Parameters  80-150 hz x15 mins    Electrical Stimulation Goals  Pain;Tone      Ultrasound   Ultrasound Location  Bilateral UTs    Ultrasound Parameters  combo US/E-stim 1.5 w/cm2 , 100%, 1 mhz x10 mins    Ultrasound Goals  Pain                  PT Long Term Goals - 04/18/19 0947      PT LONG TERM GOAL #1   Title  Patient will be independent with HEP.    Time  6    Period  Weeks    Status  Achieved      PT LONG TERM GOAL #2   Title  Patient will demonstrate 55+ degrees of cervical rotation to improve ability to scan environment.    Time  6    Period  Weeks    Status  On-going      PT LONG TERM GOAL #3   Title  Patient will report ability to perform ADLs and work activities with cervical pain less than 4/10.     Time  6    Period  Weeks    Status  On-going      PT LONG TERM GOAL #4   Title  Patient will report ability to stand for 30+ minutes for work activites with cervical pain less than 4/10.    Time  6    Period  Weeks    Status  On-going            Plan - 04/25/19 0915    Clinical  Impression Statement  Patient was able to tolerate treatment fairly well but still continues to have pain about 3 days after PT. Patient provided with a progression of HEP and was provided a handout for a home TENs unit. Patient still very tender to B UT. Normal response to modalities upon removal.    Personal Factors and Comorbidities  Comorbidity 1    Comorbidities  HTN    Examination-Activity Limitations  Lift;Stand    Stability/Clinical Decision Making  Evolving/Moderate complexity    Clinical Decision Making  Low    Rehab Potential  Fair    PT Frequency  2x / week    PT Duration  6 weeks    PT Treatment/Interventions  ADLs/Self Care Home Management;Electrical Stimulation;Moist Heat;Traction;Ultrasound;Cryotherapy;Neuromuscular re-education;Manual techniques;Therapeutic exercise;Therapeutic activities;Patient/family education;Passive range of motion;Dry needling;Spinal Manipulations    PT Next Visit Plan  gentle AROM of cervical spine, postural exercises, STW/M to cervical spine, UT and upper back. modalities PRN for pain relief.     PT Home Exercise Plan  rows, lat pull downs, chop diagonal with red theraband    Consulted and Agree with Plan of Care  Patient       Patient will benefit from skilled therapeutic intervention in order to improve the following deficits and impairments:  Pain, Postural dysfunction, Decreased activity tolerance, Decreased range of motion  Visit Diagnosis: Cervicalgia  Pain in thoracic spine     Problem List Patient Active Problem List   Diagnosis Date Noted  . Anxiety 08/29/2018  . Hypertension 03/29/2018  . Hypothyroidism (acquired) 03/29/2018  . Morbid obesity ([place]) 03/29/2018  . Chronic lumbar pain 03/29/2018  . GERD (gastroesophageal reflux disease) 03/29/2018   [name], PT, DPT 04/25/2019, 9:33 AM  [facility], [place], [address] Phone: [phone]   Fax:   [phone]  Name: Tina Chapman MRN: [id number] Date of Birth: 12-06-1965

## 2019-04-28 ENCOUNTER — Encounter: Payer: Self-pay | Admitting: Physical Therapy

## 2019-04-28 ENCOUNTER — Ambulatory Visit: Payer: BC Managed Care – PPO | Admitting: Physical Therapy

## 2019-04-28 ENCOUNTER — Other Ambulatory Visit: Payer: Self-pay

## 2019-04-28 DIAGNOSIS — M542 Cervicalgia: Secondary | ICD-10-CM | POA: Diagnosis not present

## 2019-04-28 DIAGNOSIS — M546 Pain in thoracic spine: Secondary | ICD-10-CM

## 2019-04-28 NOTE — Therapy (Addendum)
[facility] [place], [place], [address] Phone: [phone]   Fax:  [phone]  Physical Therapy Treatment PHYSICAL THERAPY DISCHARGE SUMMARY  Visits from Start of Care: 6  Current functional level related to goals / functional outcomes: See below   Remaining deficits: See goals   Education / Equipment: HEP Plan: Patient agrees to discharge.  Patient goals were not met. Patient is being discharged due to not returning since the last visit.  ?????    Tina Chapman, PT, DPT 01/01/20   Patient Details  Name: Tina Chapman MRN: [id number] Date of Birth: 07/20/1966 Referring Provider (PT): Caryl Pina, MD   Encounter Date: 04/28/2019  PT End of Session - 04/28/19 1402    Visit Number  6    Number of Visits  12    Date for PT Re-Evaluation  05/16/19    PT Start Time  9244    PT Stop Time  1434    PT Time Calculation (min)  31 min    Activity Tolerance  Patient tolerated treatment well    Behavior During Therapy  [facility] for tasks assessed/performed       Past Medical History:  Diagnosis Date  . Back pain   . Hypertension   . Sciatic nerve pain     Past Surgical History:  Procedure Laterality Date  . ABDOMINAL HYSTERECTOMY    . APPENDECTOMY    . CERVIX SURGERY    . CHOLECYSTECTOMY    . TONSILLECTOMY      There were no vitals filed for this visit.  Subjective Assessment - 04/28/19 1402    Subjective  COVID 19 screening performed on patient upon arrival. Reports feeling a lot of pain as she just got off work and she always feels like this after work. "I feel like somene hit with me with a baseball bat in my back." Reports being unable to do much after work as she goes home and lays down. Reports inability to wash dishes as well due to pain.    Pertinent History  HTN, previous cervical surgery     Limitations  Standing;House hold activities    How long can you stand comfortably?  less than 5  minutes    Patient Stated Goals  decrease pain and get back to work activities without pain    Currently in Pain?  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, Back strain, subsequent disorder    Referring Provider (PT)  Caryl Pina, MD    Next MD Visit  to be determined    Prior Therapy  no      Precautions   Precautions  None                   [facility] Adult PT Treatment/Exercise - 04/28/19 0001      Modalities   Modalities  Electrical Stimulation;Moist Heat;Ultrasound      Moist Heat Therapy   Number Minutes Moist Heat  15 Minutes    Moist Heat Location  Cervical      Electrical Stimulation   Electrical Stimulation Location  bilateral UTs,  upper thoracic paraspinals    Electrical Stimulation Action  IFC    Electrical Stimulation Parameters  80-150 hz x15 min    Electrical Stimulation Goals  Pain;Tone      Ultrasound   Ultrasound Location  B UT, cervical and upper thoracic paraspinals    Ultrasound Parameters  1.5 w/cm2, 100%, 1 mhz x10 min    Ultrasound Goals  Pain      Manual Therapy   Manual Therapy  Other (comment)   Attempted gently but unable to tolerate and requested stim                  PT Long Term Goals - 04/18/19 0947      PT LONG TERM GOAL #1   Title  Patient will be independent with HEP.    Time  6    Period  Weeks    Status  Achieved      PT LONG TERM GOAL #2   Title  Patient will demonstrate 55+ degrees of cervical rotation to improve ability to scan environment.    Time  6    Period  Weeks    Status  On-going      PT LONG TERM GOAL #3   Title  Patient will report ability to perform ADLs and work  activities with cervical pain less than 4/10.     Time  6    Period  Weeks    Status  On-going      PT LONG TERM GOAL #4   Title  Patient will report ability to stand for 30+ minutes for work activites with cervical pain less than 4/10.    Time  6    Period  Weeks    Status  On-going            Plan - 04/28/19 1431    Clinical Impression Statement  Patient presented in clinic with very guarded stance and posture. Patient had recently gotten off work and reports that this is how she always is after work. Therex avoided due to pain and patient presentation. Patient very tender to any contact of B UT, paraspinals region. Normal modalities response noted following removal of the modalities. Patient very uncomfortable and tender even to [place] head on treated area. Very light STW was attempted to B UT and paraspinals but patient unable to tolerate and requested electrical stimulation and moist heat. Patient limited by back pain with is exaggerated by work and limits her overall function. Patient very guarded with any kind of movement due to pain. Continues to experience numbness in UEs as well.    Personal Factors and Comorbidities  Comorbidity 1    Comorbidities  HTN    Examination-Activity Limitations  Lift;Stand    Stability/Clinical Decision Making  Evolving/Moderate complexity    Rehab Potential  Fair    PT Frequency  2x / week    PT Duration  6 weeks    PT Treatment/Interventions  ADLs/Self Care Home Management;Electrical Stimulation;Moist Heat;Traction;Ultrasound;Cryotherapy;Neuromuscular re-education;Manual techniques;Therapeutic exercise;Therapeutic activities;Patient/family education;Passive range of motion;Dry needling;Spinal Manipulations    PT Next Visit Plan  Continue per MD discretion    PT Home Exercise Plan  rows, lat pull downs, chop diagonal with red theraband    Consulted and Agree with Plan of Care  Patient       Patient will benefit from skilled therapeutic intervention  in order to improve  the following deficits and impairments:  Pain, Postural dysfunction, Decreased activity tolerance, Decreased range of motion  Visit Diagnosis:  Pain in thoracic spine   Cervicalgia    Problem List Patient Active Problem List   Diagnosis Date Noted  . Anxiety 08/29/2018  . Hypertension 03/29/2018  . Hypothyroidism (acquired) 03/29/2018  . Morbid obesity ([place]) 03/29/2018  . Chronic lumbar pain 03/29/2018  . GERD (gastroesophageal reflux disease) 03/29/2018    Standley Brooking, PTA 04/28/2019, 2:51 PM  [facility] [address] [place], [place], [address] Phone: [phone]   Fax:  [phone]  Name: Tina Chapman MRN: [id number] Date of Birth: 12-Nov-1966

## 2019-04-30 ENCOUNTER — Encounter: Payer: Self-pay | Admitting: Family Medicine

## 2019-04-30 ENCOUNTER — Ambulatory Visit (INDEPENDENT_AMBULATORY_CARE_PROVIDER_SITE_OTHER): Payer: BC Managed Care – PPO | Admitting: Family Medicine

## 2019-04-30 ENCOUNTER — Other Ambulatory Visit: Payer: Self-pay

## 2019-04-30 VITALS — BP 149/87 | HR 84 | Temp 97.5°F | Ht 64.0 in | Wt 242.6 lb

## 2019-04-30 DIAGNOSIS — M5442 Lumbago with sciatica, left side: Secondary | ICD-10-CM | POA: Diagnosis not present

## 2019-04-30 DIAGNOSIS — M50121 Cervical disc disorder at C4-C5 level with radiculopathy: Secondary | ICD-10-CM

## 2019-04-30 DIAGNOSIS — M5030 Other cervical disc degeneration, unspecified cervical region: Secondary | ICD-10-CM

## 2019-04-30 DIAGNOSIS — G8929 Other chronic pain: Secondary | ICD-10-CM

## 2019-04-30 MED ORDER — METHYLPREDNISOLONE ACETATE 80 MG/ML IJ SUSP
80.0000 mg | Freq: Once | INTRAMUSCULAR | Status: AC
  Administered 2019-04-30: 80 mg via INTRAMUSCULAR

## 2019-04-30 NOTE — Progress Notes (Signed)
BP (!) 149/87   Pulse 84   Temp (!) 97.5 F (36.4 C) (Oral)   Ht 5\' 4"  (1.626 m)   Wt 242 lb 9.6 oz (110 kg)   BMI 41.64 kg/m    Subjective:   Patient ID: Tina Chapman, female    DOB: 06/03/1966, 53 y.o.   MRN: [id number]  HPI: Tina Chapman is a 53 y.o. female presenting on 04/30/2019 for Back Pain (ongoing - from neck down back. Patient states she works on her feet and after standing on her feet for while she gets back pain )   HPI Patient has chronic back and neck pain that goes from her lower back up to her neck.  She gets numbness and tingling on the outsides of both of her hands on her arms which she thinks is likely because of this.  She denies any numbness or tingling going down into her legs but does have a bandlike area across her lower back that hurts as well as in her neck.  The neck is the worst of it but now she feels like it goes all the way up and down her spine currently today.  She has been doing physical therapy and trying anti-inflammatories and work modification but she feels like it is just not improving.  Relevant past medical, surgical, family and social history reviewed and updated as indicated. Interim medical history since our last visit reviewed. Allergies and medications reviewed and updated.  Review of Systems  Constitutional: Negative for chills and fever.  Eyes: Negative for visual disturbance.  Respiratory: Negative for chest tightness and shortness of breath.   Cardiovascular: Negative for chest pain and leg swelling.  Musculoskeletal: Positive for arthralgias, back pain and neck pain. Negative for gait problem.  Skin: Negative for rash.  Neurological: Positive for numbness. Negative for weakness, light-headedness and headaches.  Psychiatric/Behavioral: Negative for agitation and behavioral problems.  All other systems reviewed and are negative.   Per HPI unless specifically indicated above   Allergies as of 04/30/2019   No Known  Allergies     Medication List       Accurate as of April 30, 2019  2:58 PM. If you have any questions, ask your nurse or doctor.        cyclobenzaprine 10 MG tablet Commonly known as: FLEXERIL Take 1 tablet (10 mg total) by mouth 3 (three) times daily as needed for muscle spasms.   levothyroxine 112 MCG tablet Commonly known as: SYNTHROID Take 1 tablet (112 mcg total) by mouth daily.   lisinopril-hydrochlorothiazide 20-25 MG tablet Commonly known as: ZESTORETIC Take 0.5 tablets by mouth daily.   meloxicam 15 MG tablet Commonly known as: MOBIC Take 1 tablet (15 mg total) by mouth daily.   sertraline 50 MG tablet Commonly known as: ZOLOFT Take 50 mg by mouth daily.        Objective:   BP (!) 149/87   Pulse 84   Temp (!) 97.5 F (36.4 C) (Oral)   Ht 5\' 4"  (1.626 m)   Wt 242 lb 9.6 oz (110 kg)   BMI 41.64 kg/m   Wt Readings from Last 3 Encounters:  04/30/19 242 lb 9.6 oz (110 kg)  03/28/19 237 lb 9.6 oz (107.8 kg)  03/12/19 237 lb 9.6 oz (107.8 kg)    Physical Exam Vitals signs and nursing note reviewed.  Constitutional:      General: She is not in acute distress.    Appearance: She is well-developed. She is  not diaphoretic.  Eyes:     Conjunctiva/sclera: Conjunctivae normal.  Musculoskeletal: Normal range of motion.     Cervical back: She exhibits tenderness (Neck pain with tingling on the outsides of both of her hands.). She exhibits normal range of motion, no swelling and no deformity.     Lumbar back: She exhibits tenderness (Bilateral tenderness in a bandlike area across lower back). She exhibits normal range of motion, no swelling and no deformity.  Skin:    General: Skin is warm and dry.     Findings: No rash.  Neurological:     Mental Status: She is alert and oriented to person, place, and time.     Coordination: Coordination normal.  Psychiatric:        Behavior: Behavior normal.       Assessment & Plan:   Problem List Items Addressed This  Visit      Other   Chronic lumbar pain   Relevant Orders   MR Lumbar Spine Wo Contrast    Other Visit Diagnoses    Cervical disc disorder at C4-C5 level with radiculopathy    -  Primary   Relevant Medications   methylPREDNISolone acetate (DEPO-MEDROL) injection 80 mg (Completed)   Other Relevant Orders   MR Cervical Spine Wo Contrast   Other cervical disc degeneration, unspecified cervical region       Relevant Medications   methylPREDNISolone acetate (DEPO-MEDROL) injection 80 mg (Completed)   Other Relevant Orders   MR Cervical Spine Wo Contrast      With patient's continued back and neck pain that is causing radiculopathy down both of her arms we would like to try and get an MRI of both her upper and lower back because of these chronic issues.  It is been going on since at least March 2020 that we have documented.  She has been doing physical therapy and been trying anti-inflammatories and work modification and exercises and it just does not seem to be improving and every day after she works she has a increasingly worse.  She had to miss work today or leave early because of this pain.  Follow up plan: Return if symptoms worsen or fail to improve.  Counseling provided for all of the vaccine components Orders Placed This Encounter  Procedures  . MR Cervical Spine Wo Contrast  . MR Lumbar Spine Wo Contrast    Caryl Pina, MD [facility] 04/30/2019, 2:58 PM

## 2019-05-02 ENCOUNTER — Encounter: Payer: BC Managed Care – PPO | Admitting: Physical Therapy

## 2019-05-06 ENCOUNTER — Telehealth: Payer: Self-pay | Admitting: Family Medicine

## 2019-05-07 ENCOUNTER — Encounter: Payer: Self-pay | Admitting: Family Medicine

## 2019-05-07 DIAGNOSIS — Y990 Civilian activity done for income or pay: Secondary | ICD-10-CM | POA: Insufficient documentation

## 2019-05-07 DIAGNOSIS — S83282A Other tear of lateral meniscus, current injury, left knee, initial encounter: Secondary | ICD-10-CM | POA: Insufficient documentation

## 2019-05-07 DIAGNOSIS — M25562 Pain in left knee: Secondary | ICD-10-CM | POA: Insufficient documentation

## 2019-05-07 MED ORDER — DICLOFENAC SODIUM 75 MG PO TBEC: 75.0000 mg | DELAYED_RELEASE_TABLET | Freq: Two times a day (BID) | ORAL | 0 refills | Status: DC

## 2019-05-07 MED ORDER — DICLOFENAC SODIUM 1 % TD GEL: 2.0000 g | Freq: Four times a day (QID) | TRANSDERMAL | 1 refills | Status: DC

## 2019-05-07 NOTE — Telephone Encounter (Signed)
I called in some Voltaren for her

## 2019-05-08 ENCOUNTER — Encounter: Payer: Self-pay | Admitting: Family Medicine

## 2019-05-08 ENCOUNTER — Telehealth: Payer: Self-pay | Admitting: Family Medicine

## 2019-05-08 NOTE — Telephone Encounter (Signed)
Patient responded to in a previous email and notified of med change

## 2019-05-08 NOTE — Telephone Encounter (Signed)
Attempted to contact patient - NA °

## 2019-05-08 NOTE — Telephone Encounter (Signed)
Please let the patient know that I did send in Voltaren and Voltaren gel I do not know why they are not at the pharmacy but they both have been sent, maybe we can call the pharmacy and ask about this.  For the diarrhea she can use Imodium and if it worsens we can discuss other options but use Imodium for it for now.

## 2019-05-08 NOTE — Telephone Encounter (Signed)
Also ask her about the referral that she wanted, I do not understand why she wants to be referred to another family practice office unless he has some specialty where he does therapy or something for the back but I do not understand why she wants a referral to another family practice doctor's office, see my chart note

## 2019-05-09 ENCOUNTER — Encounter: Payer: Self-pay | Admitting: Family Medicine

## 2019-05-09 ENCOUNTER — Telehealth: Payer: Self-pay | Admitting: Family Medicine

## 2019-05-12 ENCOUNTER — Telehealth: Payer: Self-pay | Admitting: Family Medicine

## 2019-05-12 NOTE — Telephone Encounter (Signed)
Go ahead and do a work note stating that they accommodate her with a chair or stool that she can use while she is working.

## 2019-05-12 NOTE — Telephone Encounter (Signed)
Attempted to contact patient - NVM - want to check with patient on letter details before witting.

## 2019-05-12 NOTE — Telephone Encounter (Signed)
Patient had requested a work note. Please send to your nurse to provide note, if ordered.

## 2019-05-13 ENCOUNTER — Encounter: Payer: Self-pay | Admitting: Family Medicine

## 2019-05-13 ENCOUNTER — Telehealth: Payer: Self-pay | Admitting: Family Medicine

## 2019-05-13 NOTE — Telephone Encounter (Signed)
Please advise on work note and MRI

## 2019-05-13 NOTE — Telephone Encounter (Signed)
Patient would like to speak to nurse regarding questions about medications and also the work note that she needs.

## 2019-05-13 NOTE — Telephone Encounter (Signed)
Returned patient's call- VM has not been set up

## 2019-05-13 NOTE — Telephone Encounter (Signed)
MRI contact information [facility] [phone]

## 2019-05-14 ENCOUNTER — Other Ambulatory Visit: Payer: Self-pay

## 2019-05-14 ENCOUNTER — Ambulatory Visit: Payer: BC Managed Care – PPO | Admitting: Family Medicine

## 2019-05-14 NOTE — Telephone Encounter (Signed)
Refer to previous phone note 

## 2019-05-14 NOTE — Telephone Encounter (Signed)
Patient has spoke to Dr. Keturah Barre this encounter will be closed

## 2019-05-15 ENCOUNTER — Telehealth: Payer: Self-pay | Admitting: *Deleted

## 2019-05-15 NOTE — Telephone Encounter (Signed)
Pt aware and has already bought it otc.

## 2019-05-15 NOTE — Telephone Encounter (Signed)
Prior authorization denied for voltaren gel ( and generic) because plan specifies coverage for osteoarthritis in joints (knees, hands, elbows, wrists, etc) and does not cover neck pain form cervical disc disease. It also doesn't provide coverage if used in combination with an oral NSAID.   You can submit an appeal in writing but I doubt they'll reverse the decision based on their rationale.

## 2019-05-15 NOTE — Telephone Encounter (Signed)
Please let the patient know that she can buy this over-the-counter and that may be the option that she wants to do.

## 2019-05-20 ENCOUNTER — Encounter: Payer: Self-pay | Admitting: Family Medicine

## 2019-05-20 ENCOUNTER — Telehealth: Payer: Self-pay | Admitting: Family Medicine

## 2019-05-20 NOTE — Telephone Encounter (Signed)
PT wants a note stated she was needs to work restricted hours 6:45 to 1pm

## 2019-05-20 NOTE — Telephone Encounter (Signed)
Refer to previous phone call  

## 2019-05-21 ENCOUNTER — Ambulatory Visit (INDEPENDENT_AMBULATORY_CARE_PROVIDER_SITE_OTHER): Payer: BC Managed Care – PPO | Admitting: Family Medicine

## 2019-05-21 ENCOUNTER — Other Ambulatory Visit: Payer: Self-pay

## 2019-05-21 ENCOUNTER — Encounter: Payer: Self-pay | Admitting: Family Medicine

## 2019-05-21 ENCOUNTER — Ambulatory Visit: Payer: BC Managed Care – PPO

## 2019-05-21 DIAGNOSIS — M5442 Lumbago with sciatica, left side: Secondary | ICD-10-CM | POA: Diagnosis not present

## 2019-05-21 DIAGNOSIS — G8929 Other chronic pain: Secondary | ICD-10-CM

## 2019-05-21 NOTE — Telephone Encounter (Signed)
done

## 2019-05-21 NOTE — Telephone Encounter (Signed)
We can do a letter doing work restrictions for her until after the MRI, the MRI is scheduled for next Tuesday she can do work restrictions for 1 week of work no more than 6 hours

## 2019-05-21 NOTE — Progress Notes (Signed)
Attempted to call pt several times. Finally got an answer at 1610.    Virtual Visit via telephone Note Due to COVID-19, visit is conducted virtually and was requested by patient. This visit type was conducted due to national recommendations for restrictions regarding the COVID-19 Pandemic (e.g. social distancing) in an effort to limit this patient's exposure and mitigate transmission in our community. All issues noted in this document were discussed and addressed.  A physical exam was not performed with this format.   I connected with Tina Chapman on 05/21/19 at 1610 by telephone and verified that I am speaking with the correct person using two identifiers. Tina Chapman is currently located at home and family is currently with them during visit. The provider, Kari BaarsMichelle Grayce Budden, FNP is located in their office at time of visit.  I discussed the limitations, risks, security and privacy concerns of performing an evaluation and management service by telephone and the availability of in person appointments. I also discussed with the patient that there may be a patient responsible charge related to this service. The patient expressed understanding and agreed to proceed.  Subjective:  Patient ID: Tina Chapman, female    DOB: 12/21/1965, 53 y.o.   MRN: [id number]  Chief Complaint:  Back Pain   HPI: Tina Chapman is a 53 y.o. female presenting on 05/21/2019 for Back Pain   Pt reports ongoing back pain that is worse after working long hours. Pt states she has been evaluated by Dr. Louanne Skyeettinger and is awaiting a MRI. The MRI has been scheduled. Pt states she needs a work note stating she can only work 6 hours. States after being at work for more than 6 hours the pain worsens. She denies weakness, loss of bowel or bladder, loss of function, or saddle anesthesia. She states the pain does radiate down her left leg at times.   Back Pain This is a recurrent problem. The problem occurs constantly.  The problem has been waxing and waning since onset. The pain is present in the lumbar spine. The quality of the pain is described as burning, aching, stabbing and shooting. The pain radiates to the left thigh. The pain is at a severity of 6/10. The pain is moderate. The pain is worse during the day. The symptoms are aggravated by standing and twisting. Stiffness is present all day. Associated symptoms include numbness. Pertinent negatives include no chest pain, fever or weakness.     Relevant past medical, surgical, family, and social history reviewed and updated as indicated.  Allergies and medications reviewed and updated.   Past Medical History:  Diagnosis Date  . Back pain   . Hypertension   . Sciatic nerve pain     Past Surgical History:  Procedure Laterality Date  . ABDOMINAL HYSTERECTOMY    . APPENDECTOMY    . CERVIX SURGERY    . CHOLECYSTECTOMY    . TONSILLECTOMY      Social History   Socioeconomic History  . Marital status: Divorced    Spouse name: Not on file  . Number of children: Not on file  . Years of education: Not on file  . Highest education level: Not on file  Occupational History  . Not on file  Social Needs  . Financial resource strain: Not on file  . Food insecurity    Worry: Not on file    Inability: Not on file  . Transportation needs    Medical: Not on file    Non-medical: Not on file  Tobacco Use  . Smoking status: Never Smoker  . Smokeless tobacco: Never Used  Substance and Sexual Activity  . Alcohol use: Never    Frequency: Never  . Drug use: Never  . Sexual activity: Not Currently  Lifestyle  . Physical activity    Days per week: Not on file    Minutes per session: Not on file  . Stress: Not on file  Relationships  . Social Herbalist on phone: Not on file    Gets together: Not on file    Attends religious service: Not on file    Active member of club or organization: Not on file    Attends meetings of clubs or  organizations: Not on file    Relationship status: Not on file  . Intimate partner violence    Fear of current or ex partner: Not on file    Emotionally abused: Not on file    Physically abused: Not on file    Forced sexual activity: Not on file  Other Topics Concern  . Not on file  Social History Narrative   59 son 51 years old, here with her    Outpatient Encounter Medications as of 05/21/2019  Medication Sig  . cyclobenzaprine (FLEXERIL) 10 MG tablet Take 1 tablet (10 mg total) by mouth 3 (three) times daily as needed for muscle spasms.  . diclofenac (VOLTAREN) 75 MG EC tablet Take 1 tablet (75 mg total) by mouth 2 (two) times daily.  . diclofenac sodium (VOLTAREN) 1 % GEL Apply 2 g topically 4 (four) times daily.  Marland Kitchen levothyroxine (SYNTHROID, LEVOTHROID) 112 MCG tablet Take 1 tablet (112 mcg total) by mouth daily.  Marland Kitchen lisinopril-hydrochlorothiazide (ZESTORETIC) 20-25 MG tablet Take 0.5 tablets by mouth daily.  . sertraline (ZOLOFT) 50 MG tablet Take 50 mg by mouth daily.   No facility-administered encounter medications on file as of 05/21/2019.     No Known Allergies  Review of Systems  Constitutional: Negative for chills, fatigue and fever.  Respiratory: Negative for cough and shortness of breath.   Cardiovascular: Negative for chest pain and palpitations.  Musculoskeletal: Positive for arthralgias, back pain, myalgias and neck pain. Negative for gait problem and joint swelling.  Neurological: Positive for numbness. Negative for weakness.  Psychiatric/Behavioral: Negative for confusion.  All other systems reviewed and are negative.        Observations/Objective: No vital signs or physical exam, this was a telephone or virtual health encounter.  Pt alert and oriented, answers all questions appropriately, and able to speak in full sentences.    Assessment and Plan: Tina Chapman was seen today for back pain.  Diagnoses and all orders for this visit:  Chronic bilateral low  back pain with left-sided sciatica Ongoing problem. Awaiting MRI, this has been ordered and scheduled. Pt needing work note today. Will provide not. Pt to have MRI and follow up with Dr. Warrick Parisian as scheduled. Pt aware to report any new or worsening symptoms.     Follow Up Instructions: Return in about 4 weeks (around 06/18/2019), or if symptoms worsen or fail to improve, for back pain.    I discussed the assessment and treatment plan with the patient. The patient was provided an opportunity to ask questions and all were answered. The patient agreed with the plan and demonstrated an understanding of the instructions.   The patient was advised to call back or seek an in-person evaluation if the symptoms worsen or if the condition fails to improve as  anticipated.  The above assessment and management plan was discussed with the patient. The patient verbalized understanding of and has agreed to the management plan. Patient is aware to call the clinic if symptoms persist or worsen. Patient is aware when to return to the clinic for a follow-up visit. Patient educated on when it is appropriate to go to the emergency department.    I provided 15 minutes of non-face-to-face time during this encounter. The call started at 1610. The call ended at 1625. The other time was used for coordination of care.    Kari BaarsMichelle Raena Pau, FNP-C [facility] [address] [place], [place] [address] [phone]

## 2019-05-27 ENCOUNTER — Ambulatory Visit (HOSPITAL_COMMUNITY)
Admission: RE | Admit: 2019-05-27 | Discharge: 2019-05-27 | Disposition: A | Discharge status: Home / Self Care | Payer: BC Managed Care – PPO | Source: Ambulatory Visit | Attending: Family Medicine | Admitting: Family Medicine

## 2019-05-27 ENCOUNTER — Encounter: Payer: Self-pay | Admitting: Family Medicine

## 2019-05-27 ENCOUNTER — Telehealth: Payer: Self-pay | Admitting: Family Medicine

## 2019-05-27 ENCOUNTER — Other Ambulatory Visit: Payer: Self-pay

## 2019-05-27 DIAGNOSIS — M50121 Cervical disc disorder at C4-C5 level with radiculopathy: Secondary | ICD-10-CM | POA: Diagnosis present

## 2019-05-27 DIAGNOSIS — G8929 Other chronic pain: Secondary | ICD-10-CM

## 2019-05-27 DIAGNOSIS — M5030 Other cervical disc degeneration, unspecified cervical region: Secondary | ICD-10-CM | POA: Diagnosis not present

## 2019-05-27 DIAGNOSIS — M5442 Lumbago with sciatica, left side: Secondary | ICD-10-CM | POA: Diagnosis present

## 2019-05-28 ENCOUNTER — Encounter: Payer: Self-pay | Admitting: Family Medicine

## 2019-05-29 ENCOUNTER — Encounter: Payer: Self-pay | Admitting: Family Medicine

## 2019-05-29 ENCOUNTER — Other Ambulatory Visit: Payer: Self-pay

## 2019-05-29 ENCOUNTER — Ambulatory Visit (INDEPENDENT_AMBULATORY_CARE_PROVIDER_SITE_OTHER): Payer: BC Managed Care – PPO | Admitting: Family Medicine

## 2019-05-29 DIAGNOSIS — G629 Polyneuropathy, unspecified: Secondary | ICD-10-CM | POA: Diagnosis not present

## 2019-05-29 DIAGNOSIS — M48061 Spinal stenosis, lumbar region without neurogenic claudication: Secondary | ICD-10-CM

## 2019-05-29 MED ORDER — GABAPENTIN 100 MG PO CAPS: 100.0000 mg | ORAL_CAPSULE | Freq: Three times a day (TID) | ORAL | 3 refills | Status: DC

## 2019-05-29 NOTE — Progress Notes (Signed)
Virtual Visit via telephone Note  I connected with Tina Chapman on 05/29/19 at 1502 by telephone and verified that I am speaking with the correct person using two identifiers. Tina Chapman is currently located at home and no other people are currently with her during visit. The provider, Fransisca Kaufmann Dettinger, MD is located in their office at time of visit.  Call ended at 1515  I discussed the limitations, risks, security and privacy concerns of performing an evaluation and management service by telephone and the availability of in person appointments. I also discussed with the patient that there may be a patient responsible charge related to this service. The patient expressed understanding and agreed to proceed.   History and Present Illness: Patient is calling in calling in for review of spine and back pain.  She still has pain and work is wearing her out and she understands what the problem is and that there is a referral for neurosurgery.  She has been fighting this over the past few months.  No diagnosis found.  Outpatient Encounter Medications as of 05/29/2019  Medication Sig  . cyclobenzaprine (FLEXERIL) 10 MG tablet Take 1 tablet (10 mg total) by mouth 3 (three) times daily as needed for muscle spasms.  . diclofenac (VOLTAREN) 75 MG EC tablet Take 1 tablet (75 mg total) by mouth 2 (two) times daily.  . diclofenac sodium (VOLTAREN) 1 % GEL Apply 2 g topically 4 (four) times daily.  Marland Kitchen levothyroxine (SYNTHROID, LEVOTHROID) 112 MCG tablet Take 1 tablet (112 mcg total) by mouth daily.  Marland Kitchen lisinopril-hydrochlorothiazide (ZESTORETIC) 20-25 MG tablet Take 0.5 tablets by mouth daily.  . sertraline (ZOLOFT) 50 MG tablet Take 50 mg by mouth daily.   No facility-administered encounter medications on file as of 05/29/2019.     Review of Systems  Constitutional: Negative for chills and fever.  Eyes: Negative for visual disturbance.  Respiratory: Negative for chest tightness and  shortness of breath.   Cardiovascular: Negative for chest pain and leg swelling.  Musculoskeletal: Positive for arthralgias, back pain, myalgias and neck pain. Negative for gait problem.  Skin: Negative for rash.  Neurological: Negative for light-headedness and headaches.  Psychiatric/Behavioral: Negative for agitation and behavioral problems.  All other systems reviewed and are negative.   Observations/Objective: Patient sounds comfortable and in no acute distress  Assessment and Plan: Problem List Items Addressed This Visit    None    Visit Diagnoses    Foraminal stenosis of lumbar region    -  Primary   Relevant Medications   gabapentin (NEURONTIN) 100 MG capsule   Neuropathy       Relevant Medications   gabapentin (NEURONTIN) 100 MG capsule       Follow Up Instructions:  Will try gabapentin and referral to neurosurgery, extend restrictions at work until seen by neurosurgery.   I discussed the assessment and treatment plan with the patient. The patient was provided an opportunity to ask questions and all were answered. The patient agreed with the plan and demonstrated an understanding of the instructions.   The patient was advised to call back or seek an in-person evaluation if the symptoms worsen or if the condition fails to improve as anticipated.  The above assessment and management plan was discussed with the patient. The patient verbalized understanding of and has agreed to the management plan. Patient is aware to call the clinic if symptoms persist or worsen. Patient is aware when to return to the clinic for a follow-up visit.  Patient educated on when it is appropriate to go to the emergency department.    I provided 13 minutes of non-face-to-face time during this encounter.    Worthy Rancher, MD

## 2019-05-30 ENCOUNTER — Telehealth: Payer: Self-pay

## 2019-05-30 NOTE — Telephone Encounter (Signed)
-----   Message from Worthy Rancher, MD sent at 05/29/2019  3:19 PM EDT ----- Can you extend her work note restrictions that we wrote on the last note until she gets to see the neurosurgeon.  Just extend the date out by 2 weeks

## 2019-05-30 NOTE — Telephone Encounter (Signed)
Letter wrote- patient aware and will print off mychart.

## 2019-06-04 DIAGNOSIS — S83242A Other tear of medial meniscus, current injury, left knee, initial encounter: Secondary | ICD-10-CM | POA: Insufficient documentation

## 2019-06-25 DIAGNOSIS — Z9889 Other specified postprocedural states: Secondary | ICD-10-CM | POA: Insufficient documentation

## 2019-07-24 ENCOUNTER — Encounter: Payer: Self-pay | Admitting: Family Medicine

## 2019-07-24 ENCOUNTER — Other Ambulatory Visit: Payer: Self-pay

## 2019-07-24 ENCOUNTER — Ambulatory Visit (INDEPENDENT_AMBULATORY_CARE_PROVIDER_SITE_OTHER): Payer: BC Managed Care – PPO | Admitting: Family Medicine

## 2019-07-24 DIAGNOSIS — J0120 Acute ethmoidal sinusitis, unspecified: Secondary | ICD-10-CM

## 2019-07-24 DIAGNOSIS — I959 Hypotension, unspecified: Secondary | ICD-10-CM | POA: Diagnosis not present

## 2019-07-24 MED ORDER — FLUTICASONE PROPIONATE 50 MCG/ACT NA SUSP: 1.0000 | Freq: Two times a day (BID) | NASAL | 6 refills | Status: DC | PRN

## 2019-07-24 MED ORDER — BENZONATATE 100 MG PO CAPS: 100.0000 mg | ORAL_CAPSULE | Freq: Two times a day (BID) | ORAL | 0 refills | Status: DC | PRN

## 2019-07-24 MED ORDER — LISINOPRIL-HYDROCHLOROTHIAZIDE 20-25 MG PO TABS: 0.5000 | ORAL_TABLET | Freq: Every day | ORAL | 3 refills | Status: DC

## 2019-07-24 MED ORDER — AZITHROMYCIN 250 MG PO TABS: ORAL_TABLET | ORAL | 0 refills | Status: DC

## 2019-07-24 NOTE — Progress Notes (Signed)
Virtual Visit via telephone Note  I connected with Tina Chapman on 07/24/19 at 1515 by telephone and verified that I am speaking with the correct person using two identifiers. Tina Chapman is currently located at home and no other people are currently with her during visit. The provider, Elige RadonJoshua A Leonila Speranza, MD is located in their office at time of visit.  Call ended at 1524  I discussed the limitations, risks, security and privacy concerns of performing an evaluation and management service by telephone and the availability of in person appointments. I also discussed with the patient that there may be a patient responsible charge related to this service. The patient expressed understanding and agreed to proceed.   History and Present Illness: Patient is calling in with sore throat and cough and irritation and it has been coming up and keeping her at night.  She is having coughing spells.  She denies any fevers or chills or shortness of breath or wheezing. She does have heartburn and acid reflux.  She took tums and it helped some. She has sinus pressure between her eyes. She has tried menthol and it for a short period of time.  The air conditioning makes it worse. This has been going on for a couple of weeks.   No diagnosis found.  Outpatient Encounter Medications as of 07/24/2019  Medication Sig  . cyclobenzaprine (FLEXERIL) 10 MG tablet Take 1 tablet (10 mg total) by mouth 3 (three) times daily as needed for muscle spasms.  . diclofenac (VOLTAREN) 75 MG EC tablet Take 1 tablet (75 mg total) by mouth 2 (two) times daily.  . diclofenac sodium (VOLTAREN) 1 % GEL Apply 2 g topically 4 (four) times daily.  Marland Kitchen. gabapentin (NEURONTIN) 100 MG capsule Take 1 capsule (100 mg total) by mouth 3 (three) times daily.  Marland Kitchen. levothyroxine (SYNTHROID, LEVOTHROID) 112 MCG tablet Take 1 tablet (112 mcg total) by mouth daily.  Marland Kitchen. lisinopril-hydrochlorothiazide (ZESTORETIC) 20-25 MG tablet Take 0.5 tablets  by mouth daily.  . sertraline (ZOLOFT) 50 MG tablet Take 50 mg by mouth daily.   No facility-administered encounter medications on file as of 07/24/2019.     Review of Systems  Constitutional: Negative for chills and fever.  HENT: Positive for congestion, postnasal drip, rhinorrhea, sinus pressure and sore throat. Negative for ear discharge, ear pain and sneezing.   Eyes: Negative for pain, redness and visual disturbance.  Respiratory: Positive for cough. Negative for chest tightness and shortness of breath.   Cardiovascular: Negative for chest pain and leg swelling.  Genitourinary: Negative for difficulty urinating and dysuria.  Musculoskeletal: Negative for back pain and gait problem.  Skin: Negative for rash.  Neurological: Negative for light-headedness and headaches.  Psychiatric/Behavioral: Negative for agitation and behavioral problems.  All other systems reviewed and are negative.   Observations/Objective: Patient sounds comfortable and in no acute distress.  Assessment and Plan: Problem List Items Addressed This Visit    None    Visit Diagnoses    Acute non-recurrent ethmoidal sinusitis    -  Primary   Relevant Medications   azithromycin (ZITHROMAX) 250 MG tablet   fluticasone (FLONASE) 50 MCG/ACT nasal spray   benzonatate (TESSALON) 100 MG capsule   Hypotension, unspecified hypotension type       Relevant Medications   lisinopril-hydrochlorothiazide (ZESTORETIC) 20-25 MG tablet       Follow Up Instructions:  As needed if worsens.    I discussed the assessment and treatment plan with the patient. The patient was  provided an opportunity to ask questions and all were answered. The patient agreed with the plan and demonstrated an understanding of the instructions.   The patient was advised to call back or seek an in-person evaluation if the symptoms worsen or if the condition fails to improve as anticipated.  The above assessment and management plan was discussed  with the patient. The patient verbalized understanding of and has agreed to the management plan. Patient is aware to call the clinic if symptoms persist or worsen. Patient is aware when to return to the clinic for a follow-up visit. Patient educated on when it is appropriate to go to the emergency department.    I provided 9 minutes of non-face-to-face time during this encounter.    Worthy Rancher, MD

## 2019-08-25 ENCOUNTER — Telehealth: Payer: Self-pay | Admitting: Family Medicine

## 2019-08-25 NOTE — Telephone Encounter (Signed)
Tele done for same s/s 9/10 with Dettinger. Please address

## 2019-08-25 NOTE — Telephone Encounter (Signed)
She got an antibiotic then, did go away and then come back or is it persisted, she likely needs a tele-visit to discuss this, please schedule her for tomorrow if I have openings

## 2019-08-26 ENCOUNTER — Encounter: Payer: Self-pay | Admitting: Family Medicine

## 2019-08-26 ENCOUNTER — Ambulatory Visit (INDEPENDENT_AMBULATORY_CARE_PROVIDER_SITE_OTHER): Payer: BC Managed Care – PPO | Admitting: Family Medicine

## 2019-08-26 DIAGNOSIS — J40 Bronchitis, not specified as acute or chronic: Secondary | ICD-10-CM | POA: Diagnosis not present

## 2019-08-26 DIAGNOSIS — J0120 Acute ethmoidal sinusitis, unspecified: Secondary | ICD-10-CM

## 2019-08-26 MED ORDER — AMOXICILLIN-POT CLAVULANATE 875-125 MG PO TABS: 1.0000 | ORAL_TABLET | Freq: Two times a day (BID) | ORAL | 0 refills | Status: DC

## 2019-08-26 MED ORDER — DEXAMETHASONE 2 MG PO TABS: ORAL_TABLET | ORAL | 0 refills | Status: DC

## 2019-08-26 NOTE — Telephone Encounter (Signed)
Patient scheduled at 3:25 per patients request.

## 2019-08-26 NOTE — Progress Notes (Signed)
Virtual Visit via telephone Note  I connected with Tina Chapman on 08/26/19 at 1549 by telephone and verified that I am speaking with the correct person using two identifiers. Tina Chapman is currently located at home and no other people are currently with her during visit. The provider, Elige Radon Dettinger, MD is located in their office at time of visit.  Call ended at 1605  I discussed the limitations, risks, security and privacy concerns of performing an evaluation and management service by telephone and the availability of in person appointments. I also discussed with the patient that there may be a patient responsible charge related to this service. The patient expressed understanding and agreed to proceed.   History and Present Illness: Patient is calling in with scratchy and sore throat and coughing.  She uses menthol and it helps.  The air irritates her throat and will make her worse.  She has used dayquil without much help.  She is having body aches but no fevers or chills. She has more coughing and body aches at night.  She denies breathing difficulties.  She has some production to her cough. She bought an otc allergy pill and it helps. She feels tight in her chest with deep inspiration.  No diagnosis found.  Outpatient Encounter Medications as of 08/26/2019  Medication Sig  . azithromycin (ZITHROMAX) 250 MG tablet Take 2 the first day and then one each day after.  . benzonatate (TESSALON) 100 MG capsule Take 1 capsule (100 mg total) by mouth 2 (two) times daily as needed for cough.  . cyclobenzaprine (FLEXERIL) 10 MG tablet Take 1 tablet (10 mg total) by mouth 3 (three) times daily as needed for muscle spasms.  . diclofenac (VOLTAREN) 75 MG EC tablet Take 1 tablet (75 mg total) by mouth 2 (two) times daily.  . diclofenac sodium (VOLTAREN) 1 % GEL Apply 2 g topically 4 (four) times daily.  . fluticasone (FLONASE) 50 MCG/ACT nasal spray Place 1 spray into both nostrils 2  (two) times daily as needed for allergies or rhinitis.  Marland Kitchen gabapentin (NEURONTIN) 100 MG capsule Take 1 capsule (100 mg total) by mouth 3 (three) times daily.  Marland Kitchen levothyroxine (SYNTHROID, LEVOTHROID) 112 MCG tablet Take 1 tablet (112 mcg total) by mouth daily.  Marland Kitchen lisinopril-hydrochlorothiazide (ZESTORETIC) 20-25 MG tablet Take 0.5 tablets by mouth daily.  . sertraline (ZOLOFT) 50 MG tablet Take 50 mg by mouth daily.   No facility-administered encounter medications on file as of 08/26/2019.     Review of Systems  Constitutional: Negative for chills and fever.  HENT: Positive for congestion, postnasal drip, rhinorrhea, sinus pressure, sneezing and sore throat. Negative for ear discharge and ear pain.   Eyes: Negative for visual disturbance.  Respiratory: Positive for cough and chest tightness. Negative for shortness of breath and wheezing.   Cardiovascular: Negative for chest pain and leg swelling.  Musculoskeletal: Positive for myalgias. Negative for back pain and gait problem.  Skin: Negative for color change and rash.  Neurological: Negative for light-headedness and headaches.  Psychiatric/Behavioral: Negative for agitation and behavioral problems.  All other systems reviewed and are negative.   Observations/Objective: Patient sounds comfortable and in no acute distress  Assessment and Plan: Problem List Items Addressed This Visit    None    Visit Diagnoses    Acute non-recurrent ethmoidal sinusitis    -  Primary   Relevant Medications   amoxicillin-clavulanate (AUGMENTIN) 875-125 MG tablet   dexamethasone (DECADRON) 2 MG tablet  Bronchitis       Relevant Medications   amoxicillin-clavulanate (AUGMENTIN) 875-125 MG tablet   dexamethasone (DECADRON) 2 MG tablet     give augmentin and dexamethasone. Will treat for bronchitis hopefully they can hope covid testing is negative Follow Up Instructions:  Recommend covid testing tomorrow and    I discussed the assessment and  treatment plan with the patient. The patient was provided an opportunity to ask questions and all were answered. The patient agreed with the plan and demonstrated an understanding of the instructions.   The patient was advised to call back or seek an in-person evaluation if the symptoms worsen or if the condition fails to improve as anticipated.  The above assessment and management plan was discussed with the patient. The patient verbalized understanding of and has agreed to the management plan. Patient is aware to call the clinic if symptoms persist or worsen. Patient is aware when to return to the clinic for a follow-up visit. Patient educated on when it is appropriate to go to the emergency department.    I provided 16 minutes of non-face-to-face time during this encounter.    Worthy Rancher, MD

## 2019-09-11 ENCOUNTER — Ambulatory Visit (INDEPENDENT_AMBULATORY_CARE_PROVIDER_SITE_OTHER): Payer: BC Managed Care – PPO | Admitting: Family Medicine

## 2019-09-11 ENCOUNTER — Encounter: Payer: Self-pay | Admitting: Family Medicine

## 2019-09-11 DIAGNOSIS — S39012D Strain of muscle, fascia and tendon of lower back, subsequent encounter: Secondary | ICD-10-CM

## 2019-09-11 DIAGNOSIS — M50121 Cervical disc disorder at C4-C5 level with radiculopathy: Secondary | ICD-10-CM | POA: Diagnosis not present

## 2019-09-11 DIAGNOSIS — M5030 Other cervical disc degeneration, unspecified cervical region: Secondary | ICD-10-CM | POA: Diagnosis not present

## 2019-09-11 MED ORDER — CYCLOBENZAPRINE HCL 10 MG PO TABS: 10.0000 mg | ORAL_TABLET | Freq: Three times a day (TID) | ORAL | 3 refills | Status: DC | PRN

## 2019-09-11 NOTE — Progress Notes (Signed)
Virtual Visit via telephone Note  I connected with Tina Chapman on 09/11/19 at 1418 by telephone and verified that I am speaking with the correct person using two identifiers. Tina Chapman is currently located at home and no other people are currently with her during visit. The provider, Elige Radon Dettinger, MD is located in their office at time of visit.  Call ended at 1432  I discussed the limitations, risks, security and privacy concerns of performing an evaluation and management service by telephone and the availability of in person appointments. I also discussed with the patient that there may be a patient responsible charge related to this service. The patient expressed understanding and agreed to proceed.   History and Present Illness: Patient saw orthopedic and she needs a work note the same as before.  She needs restrictions for back.  She is doing PT next week and did an injection for her back and neck.  She is doing therapy for her neck and will start with her back. She will need restrictions for work.  No diagnosis found.  Outpatient Encounter Medications as of 09/11/2019  Medication Sig  . amoxicillin-clavulanate (AUGMENTIN) 875-125 MG tablet Take 1 tablet by mouth 2 (two) times daily.  . cyclobenzaprine (FLEXERIL) 10 MG tablet Take 1 tablet (10 mg total) by mouth 3 (three) times daily as needed for muscle spasms.  Marland Kitchen dexamethasone (DECADRON) 2 MG tablet Take 4 tablets for 3 days then 3 tablets for 3 days then 2 tablets for 3 days then 1 tablet for 3 days  . diclofenac (VOLTAREN) 75 MG EC tablet Take 1 tablet (75 mg total) by mouth 2 (two) times daily.  . diclofenac sodium (VOLTAREN) 1 % GEL Apply 2 g topically 4 (four) times daily.  . fluticasone (FLONASE) 50 MCG/ACT nasal spray Place 1 spray into both nostrils 2 (two) times daily as needed for allergies or rhinitis.  Marland Kitchen gabapentin (NEURONTIN) 100 MG capsule Take 1 capsule (100 mg total) by mouth 3 (three) times  daily.  Marland Kitchen levothyroxine (SYNTHROID, LEVOTHROID) 112 MCG tablet Take 1 tablet (112 mcg total) by mouth daily.  Marland Kitchen lisinopril-hydrochlorothiazide (ZESTORETIC) 20-25 MG tablet Take 0.5 tablets by mouth daily.  . sertraline (ZOLOFT) 50 MG tablet Take 50 mg by mouth daily.   No facility-administered encounter medications on file as of 09/11/2019.      Review of Systems  Constitutional: Negative for chills and fever.  Eyes: Negative for visual disturbance.  Respiratory: Negative for chest tightness and shortness of breath.   Cardiovascular: Negative for chest pain and leg swelling.  Musculoskeletal: Positive for arthralgias, back pain, myalgias and neck pain. Negative for gait problem.  Skin: Negative for rash.  Neurological: Negative for light-headedness and headaches.  Psychiatric/Behavioral: Negative for agitation and behavioral problems.  All other systems reviewed and are negative.   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 level with radiculopathy       Relevant Medications   cyclobenzaprine (FLEXERIL) 10 MG tablet   Other cervical disc degeneration, unspecified cervical region       Relevant Medications   cyclobenzaprine (FLEXERIL) 10 MG tablet   Back strain, subsequent encounter       Relevant Medications   cyclobenzaprine (FLEXERIL) 10 MG tablet       Follow Up Instructions: Patient was cleared by orthopedic for  her knee that she can go back to work and she wants to go back with the same restrictions she had previously from her back restrictions, we will go ahead and write for the prescriptions want her to go ahead and speak with her spinal surgeon and let me know how much or how little he wants her to be doing.  Follow-up as needed   I discussed the assessment and treatment plan with the patient. The patient was  provided an opportunity to ask questions and all were answered. The patient agreed with the plan and demonstrated an understanding of the instructions.   The patient was advised to call back or seek an in-person evaluation if the symptoms worsen or if the condition fails to improve as anticipated.  The above assessment and management plan was discussed with the patient. The patient verbalized understanding of and has agreed to the management plan. Patient is aware to call the clinic if symptoms persist or worsen. Patient is aware when to return to the clinic for a follow-up visit. Patient educated on when it is appropriate to go to the emergency department.    I provided 14 minutes of non-face-to-face time during this encounter.    Worthy Rancher, MD

## 2019-10-01 ENCOUNTER — Ambulatory Visit (INDEPENDENT_AMBULATORY_CARE_PROVIDER_SITE_OTHER): Payer: BC Managed Care – PPO | Admitting: Family Medicine

## 2019-10-01 ENCOUNTER — Other Ambulatory Visit: Payer: Self-pay

## 2019-10-01 DIAGNOSIS — R112 Nausea with vomiting, unspecified: Secondary | ICD-10-CM

## 2019-10-01 DIAGNOSIS — R197 Diarrhea, unspecified: Secondary | ICD-10-CM | POA: Diagnosis not present

## 2019-10-01 MED ORDER — ONDANSETRON 4 MG PO TBDP: ORAL_TABLET | ORAL | 0 refills | Status: DC

## 2019-10-01 NOTE — Patient Instructions (Signed)

## 2019-10-01 NOTE — Progress Notes (Signed)
Telephone visit  Subjective: CC: GI illness PCP: Dettinger, Fransisca Kaufmann, MD NAT:[id number] Tina Chapman is a 53 y.o. female calls for telephone consult today. Patient provides verbal consent for consult held via phone.  Location of patient: home Location of provider: Working remotely from home Others present for call: none  1. GI illness Patient reports onset of watery diarrhea about 2 days ago.  She has been trying to drink black tea but this is not helping with the diarrhea.  Denies any consumption of undercooked food, food left out too long.  No consumption of untreated water.  No known sick contacts.  Denies any fevers.  She has some abdominal cramping but this is relieved by defecation.  She had a scant blood after going to the bathroom 3 times but it is mostly mucus within the stool.  She now reports some nausea and a episode of vomiting earlier today.  She was sent home from work today due to symptoms and subsequently is being tested for COVID-19 this afternoon.  She is asking for recommendations and work note.   ROS: Per HPI  No Known Allergies Past Medical History:  Diagnosis Date  . Back pain   . Hypertension   . Sciatic nerve pain     Current Outpatient Medications:  .  cyclobenzaprine (FLEXERIL) 10 MG tablet, Take 1 tablet (10 mg total) by mouth 3 (three) times daily as needed for muscle spasms., Disp: 60 tablet, Rfl: 3 .  diclofenac (VOLTAREN) 75 MG EC tablet, Take 1 tablet (75 mg total) by mouth 2 (two) times daily., Disp: 30 tablet, Rfl: 0 .  diclofenac sodium (VOLTAREN) 1 % GEL, Apply 2 g topically 4 (four) times daily., Disp: 150 g, Rfl: 1 .  fluticasone (FLONASE) 50 MCG/ACT nasal spray, Place 1 spray into both nostrils 2 (two) times daily as needed for allergies or rhinitis., Disp: 16 g, Rfl: 6 .  gabapentin (NEURONTIN) 100 MG capsule, Take 1 capsule (100 mg total) by mouth 3 (three) times daily., Disp: 90 capsule, Rfl: 3 .  levothyroxine (SYNTHROID, LEVOTHROID) 112 MCG  tablet, Take 1 tablet (112 mcg total) by mouth daily., Disp: 90 tablet, Rfl: 3 .  lisinopril-hydrochlorothiazide (ZESTORETIC) 20-25 MG tablet, Take 0.5 tablets by mouth daily., Disp: 15 tablet, Rfl: 3 .  sertraline (ZOLOFT) 50 MG tablet, Take 50 mg by mouth daily., Disp: , Rfl:   Assessment/ Plan: 53 y.o. female   1. Nausea vomiting and diarrhea Uncertain etiology.  She has scheduled COVID-19 testing which I agree with.  I have sent her in Zofran ODT to control nausea and vomiting.  It does not sound that she is having any evidence of dehydration at this time she is tolerating oral fluids.  We discussed continuing oral fluids.  Monitoring for worsening signs or symptoms.  I have recommend that she not return to work until she has been totally asymptomatic for 3 consecutive days and has a negative COVID-19 test.  If her COVID-19 test is positive then of course she will need to adjust time off as per CDC guidelines.  She voiced good understanding of this.  A work note has been provided to her and her my chart. - ondansetron (ZOFRAN ODT) 4 MG disintegrating tablet; Dissolve 1 tablet in mouth every 8 hours if needed for nausea and vomiting  Dispense: 20 tablet; Refill: 0   Start time: 1:17pm End time: 1:25pm  Total time spent on patient care (including telephone call/ virtual visit): 13 minutes  Janora Norlander, DO  [place] [phone]

## 2019-10-13 ENCOUNTER — Encounter: Payer: Self-pay | Admitting: Family Medicine

## 2019-10-15 ENCOUNTER — Encounter: Payer: Self-pay | Admitting: Family Medicine

## 2019-10-15 NOTE — Telephone Encounter (Signed)
Patient is asking if we can extend her note till after she is finished with her injections and physical therapy? Her next appt with Dr. Brien Few is 10/29/19. Please advise

## 2019-10-16 ENCOUNTER — Encounter: Payer: Self-pay | Admitting: Nurse Practitioner

## 2019-10-16 ENCOUNTER — Encounter: Payer: Self-pay | Admitting: Family Medicine

## 2019-10-16 ENCOUNTER — Ambulatory Visit (INDEPENDENT_AMBULATORY_CARE_PROVIDER_SITE_OTHER): Payer: BC Managed Care – PPO | Admitting: Nurse Practitioner

## 2019-10-16 DIAGNOSIS — R112 Nausea with vomiting, unspecified: Secondary | ICD-10-CM | POA: Diagnosis not present

## 2019-10-16 DIAGNOSIS — R197 Diarrhea, unspecified: Secondary | ICD-10-CM | POA: Diagnosis not present

## 2019-10-16 MED ORDER — CIPROFLOXACIN HCL 500 MG PO TABS: 500.0000 mg | ORAL_TABLET | Freq: Two times a day (BID) | ORAL | 0 refills | Status: DC

## 2019-10-16 MED ORDER — ONDANSETRON HCL 4 MG PO TABS: 4.0000 mg | ORAL_TABLET | Freq: Three times a day (TID) | ORAL | 0 refills | Status: DC | PRN

## 2019-10-16 NOTE — Progress Notes (Signed)
Virtual Visit via telephone Note Due to COVID-19 pandemic this visit was conducted virtually. This visit type was conducted due to national recommendations for restrictions regarding the COVID-19 Pandemic (e.g. social distancing, sheltering in place) in an effort to limit this patient's exposure and mitigate transmission in our community. All issues noted in this document were discussed and addressed.  A physical exam was not performed with this format.  I connected with Tina Chapman on 10/16/19 at 9:45 by telephone and verified that I am speaking with the correct person using two identifiers. Tina Chapman is currently located at home and no one is currently with her during visit. The provider, Mary-Margaret Hassell Done, FNP is located in their office at time of visit.  I discussed the limitations, risks, security and privacy concerns of performing an evaluation and management service by telephone and the availability of in person appointments. I also discussed with the patient that there may be a patient responsible charge related to this service. The patient expressed understanding and agreed to proceed.   History and Present Illness:   Chief Complaint: Back Pain and Diarrhea   HPI Patient calls in today for a telephone visit c/o vomiting and back pain with diarrhea. The diarrhea started several days ago, going 2-3 times a day. Vomiting is coming and going for 2 days with this worse being this morning. She has tried mylanta which has not helped  Denies body aches, no fever. Her diet has not changed. She had a telephone visit on 10/01/19 with the same symptoms. Was given zofran and was told to have covid testing. covid test was negative. She was tested again on Tuesday but results are not back yet.   Review of Systems  Constitutional: Negative.   HENT: Negative.   Respiratory: Negative.   Cardiovascular: Negative.   Gastrointestinal: Positive for diarrhea and vomiting. Negative for  blood in stool.  Genitourinary: Negative.   Musculoskeletal: Negative.   Skin: Negative.   Neurological: Negative.   Psychiatric/Behavioral: Negative.   All other systems reviewed and are negative.    Observations/Objective: Alert and oriented- answers all questions appropriately No distress    Assessment and Plan: Tina Chapman in today with chief complaint of Back Pain and Diarrhea   1. Nausea vomiting and diarrhea First 24 Hours-Clear liquids  popsicles  Jello  gatorade  Sprite Second 24 hours-Add Full liquids ( Liquids you cant see through) Third 24 hours- Bland diet ( foods that are baked or broiled)  *avoiding fried foods and highly spiced foods* During these 3 days  Avoid milk, cheese, ice cream or any other dairy products  Avoid caffeine- REMEMBER Mt. Dew and Mello Yellow contain lots of caffeine You should eat and drink in  Frequent small volumes If no improvement in symptoms or worsen in 2-3 days should RETRUN TO OFFICE or go to ER! Imodium AD OTC fo diarrhea    - ciprofloxacin (CIPRO) 500 MG tablet; Take 1 tablet (500 mg total) by mouth 2 (two) times daily.  Dispense: 10 tablet; Refill: 0 - ondansetron (ZOFRAN) 4 MG tablet; Take 1 tablet (4 mg total) by mouth every 8 (eight) hours as needed for nausea or vomiting.  Dispense: 20 tablet; Refill: 0   Follow Up Instructions: prn    I discussed the assessment and treatment plan with the patient. The patient was provided an opportunity to ask questions and all were answered. The patient agreed with the plan and demonstrated an understanding of the instructions.   The patient  was advised to call back or seek an in-person evaluation if the symptoms worsen or if the condition fails to improve as anticipated.  The above assessment and management plan was discussed with the patient. The patient verbalized understanding of and has agreed to the management plan. Patient is aware to call the clinic if symptoms  persist or worsen. Patient is aware when to return to the clinic for a follow-up visit. Patient educated on when it is appropriate to go to the emergency department.   Time call ended:  10:00  I provided 15 minutes of non-face-to-face time during this encounter.    Mary-Margaret Daphine Deutscher, FNP

## 2019-10-21 ENCOUNTER — Encounter: Payer: Self-pay | Admitting: Family Medicine

## 2019-10-29 ENCOUNTER — Ambulatory Visit: Payer: BC Managed Care – PPO | Admitting: Family Medicine

## 2019-11-01 ENCOUNTER — Other Ambulatory Visit: Payer: Self-pay | Admitting: Family Medicine

## 2019-11-05 ENCOUNTER — Encounter: Payer: Self-pay | Admitting: Family Medicine

## 2019-11-05 DIAGNOSIS — K219 Gastro-esophageal reflux disease without esophagitis: Secondary | ICD-10-CM

## 2019-11-17 ENCOUNTER — Ambulatory Visit (INDEPENDENT_AMBULATORY_CARE_PROVIDER_SITE_OTHER): Payer: BC Managed Care – PPO | Admitting: Family Medicine

## 2019-11-17 ENCOUNTER — Encounter: Payer: Self-pay | Admitting: Family Medicine

## 2019-11-17 ENCOUNTER — Encounter: Payer: Self-pay | Admitting: Internal Medicine

## 2019-11-17 DIAGNOSIS — Z794 Long term (current) use of insulin: Secondary | ICD-10-CM

## 2019-11-17 DIAGNOSIS — I10 Essential (primary) hypertension: Secondary | ICD-10-CM

## 2019-11-17 DIAGNOSIS — E039 Hypothyroidism, unspecified: Secondary | ICD-10-CM

## 2019-11-17 DIAGNOSIS — E119 Type 2 diabetes mellitus without complications: Secondary | ICD-10-CM

## 2019-11-17 DIAGNOSIS — M50121 Cervical disc disorder at C4-C5 level with radiculopathy: Secondary | ICD-10-CM

## 2019-11-17 MED ORDER — LISINOPRIL 10 MG PO TABS: 10.0000 mg | ORAL_TABLET | Freq: Every day | ORAL | 3 refills | Status: DC

## 2019-11-17 NOTE — Progress Notes (Signed)
31   Subjective:    Patient ID: Tina Chapman, female    DOB: 11/23/1965, 54 y.o.   MRN: [id number]   HPI: Tina Chapman is a 54 y.o. female presenting for hospital follow up.  DCed on 12/31. Feeling weak all over, dizzy. Vision is not clear. Using walker for ambulation due to weakness. Dxed with DM using insulin. Also taking antibiotic for bladder infection. Afraid to return to work. I can't do what I used to. Glucose 158 before breakfast today. 211 after. Having HA radiating to neck and shoulders. Metal plate in cervical spine shows a screw is loose and causing pain. Has had injections into the area.   DCed her BP medication. Hasn't measured it  Feels like it isn't a problem. When her BP goes up she has an explosive HA. Since she hasn't had the headache the BP is fine.   Taking metformin 500 mg BID as well as the insulin. Glucose went to 600 at the time of hospitalization.    Depression screen [facility] 2/9 04/30/2019 03/28/2019 11/19/2018 08/23/2018 06/18/2018  Decreased Interest 0 0 0 0 0  Down, Depressed, Hopeless 0 0 0 0 0  PHQ - 2 Score 0 0 0 0 0  Altered sleeping - - - - -  Tired, decreased energy - - - - -  Change in appetite - - - - -  Feeling bad or failure about yourself  - - - - -  Trouble concentrating - - - - -  Moving slowly or fidgety/restless - - - - -  Suicidal thoughts - - - - -  PHQ-9 Score - - - - -     Relevant past medical, surgical, family and social history reviewed and updated as indicated.  Interim medical history since our last visit reviewed. Allergies and medications reviewed and updated.  ROS:  Review of Systems  Constitutional: Positive for fatigue.  HENT: Negative for congestion.   Eyes: Negative for visual disturbance.  Respiratory: Negative for shortness of breath.   Cardiovascular: Negative for chest pain.  Gastrointestinal: Negative for abdominal pain, constipation, diarrhea, nausea and vomiting.  Genitourinary: Negative for difficulty  urinating.  Musculoskeletal: Positive for arthralgias, myalgias and neck pain.  Neurological: Positive for weakness (nonfocal) and headaches.  Psychiatric/Behavioral: Positive for dysphoric mood. Negative for sleep disturbance.     Social History   Tobacco Use  Smoking Status Never Smoker  Smokeless Tobacco Never Used       Objective:     Wt Readings from Last 3 Encounters:  04/30/19 242 lb 9.6 oz (110 kg)  03/28/19 237 lb 9.6 oz (107.8 kg)  03/12/19 237 lb 9.6 oz (107.8 kg)     Exam deferred. Pt. Harboring due to COVID 19. Phone visit performed.   Assessment & Plan:   1. Diabetes mellitus, type II, insulin dependent ([place])   2. Essential hypertension   3. Hypothyroidism (acquired)   4. Cervical disc disorder at C4-C5 level with radiculopathy     Meds ordered this encounter  Medications  . DISCONTD: lisinopril (ZESTRIL) 10 MG tablet    Sig: Take 1 tablet (10 mg total) by mouth daily.    Dispense:  90 tablet    Refill:  3  . lisinopril (ZESTRIL) 10 MG tablet    Sig: Take 1 tablet (10 mg total) by mouth daily.    Dispense:  90 tablet    Refill:  3    No orders of the defined types were placed in this encounter.  Pt. Needs close follow up with diabetes. She was advised to resume her BP med and use a monitor to track - particularly since she has headache pain from multiple sources including radiculopathy. She will continue her DM meds as is. Marland Kitchen Notify office for any highs or lows. She should see Dr. Louanne Skye soon. She needs visit to more closely evaluate the potentioa for depression impacting her condition. This was difficult to determine at todays visit, but she seemedsad during the interview.   Diagnoses and all orders for this visit:  Diabetes mellitus, type II, insulin dependent ([facility])  Essential hypertension  Hypothyroidism (acquired)  Cervical disc disorder at C4-C5 level with radiculopathy  Other orders -     Discontinue: lisinopril (ZESTRIL) 10 MG  tablet; Take 1 tablet (10 mg total) by mouth daily. -     lisinopril (ZESTRIL) 10 MG tablet; Take 1 tablet (10 mg total) by mouth daily.    Virtual Visit via telephone Note  I discussed the limitations, risks, security and privacy concerns of performing an evaluation and management service by telephone and the availability of in person appointments. The patient was identified with two identifiers. Pt.expressed understanding and agreed to proceed. Pt. Is at home. Dr. Darlyn Read is in his office.  Follow Up Instructions:   I discussed the assessment and treatment plan with the patient. The patient was provided an opportunity to ask questions and all were answered. The patient agreed with the plan and demonstrated an understanding of the instructions.   The patient was advised to call back or seek an in-person evaluation if the symptoms worsen or if the condition fails to improve as anticipated.   Total minutes including chart review and phone contact time: 31   Follow up plan: Return in about 2 weeks (around 12/01/2019).  Mechele Claude, MD [place] [facility]

## 2019-11-18 ENCOUNTER — Encounter: Payer: Self-pay | Admitting: Family Medicine

## 2019-11-18 ENCOUNTER — Telehealth: Payer: Self-pay | Admitting: Family Medicine

## 2019-11-18 NOTE — Telephone Encounter (Signed)
Aware of letter. Hospital follow up scheduled with pcp.

## 2019-11-18 NOTE — Telephone Encounter (Signed)
I don't see a letter in her chart from yesterdays visit. Do we need to write one and if so what does it need to say?

## 2019-11-18 NOTE — Telephone Encounter (Signed)
Letter sent.

## 2019-12-02 ENCOUNTER — Other Ambulatory Visit: Payer: Self-pay

## 2019-12-03 ENCOUNTER — Encounter: Payer: Self-pay | Admitting: Family Medicine

## 2019-12-03 ENCOUNTER — Other Ambulatory Visit: Payer: Self-pay | Admitting: *Deleted

## 2019-12-03 ENCOUNTER — Telehealth: Payer: Self-pay | Admitting: Family Medicine

## 2019-12-03 ENCOUNTER — Ambulatory Visit: Payer: BC Managed Care – PPO | Admitting: Family Medicine

## 2019-12-03 VITALS — BP 153/99 | HR 91 | Temp 99.1°F | Ht 64.0 in | Wt 234.6 lb

## 2019-12-03 DIAGNOSIS — I10 Essential (primary) hypertension: Secondary | ICD-10-CM | POA: Diagnosis not present

## 2019-12-03 DIAGNOSIS — F419 Anxiety disorder, unspecified: Secondary | ICD-10-CM

## 2019-12-03 DIAGNOSIS — E119 Type 2 diabetes mellitus without complications: Secondary | ICD-10-CM | POA: Diagnosis not present

## 2019-12-03 DIAGNOSIS — E039 Hypothyroidism, unspecified: Secondary | ICD-10-CM | POA: Diagnosis not present

## 2019-12-03 DIAGNOSIS — Z794 Long term (current) use of insulin: Secondary | ICD-10-CM

## 2019-12-03 DIAGNOSIS — F339 Major depressive disorder, recurrent, unspecified: Secondary | ICD-10-CM | POA: Diagnosis not present

## 2019-12-03 DIAGNOSIS — H538 Other visual disturbances: Secondary | ICD-10-CM

## 2019-12-03 LAB — BAYER DCA HB A1C WAIVED: HB A1C (BAYER DCA - WAIVED): 12 % — ABNORMAL HIGH (ref <7.0)

## 2019-12-03 MED ORDER — SERTRALINE HCL 50 MG PO TABS: 50.0000 mg | ORAL_TABLET | Freq: Every day | ORAL | 3 refills | Status: DC

## 2019-12-03 MED ORDER — "INSULIN SYRINGE-NEEDLE U-100 31G X 5/16"" 1 ML MISC": 1.0000 | Freq: Four times a day (QID) | 11 refills | Status: DC

## 2019-12-03 MED ORDER — INSULIN ASPART 100 UNIT/ML ~~LOC~~ SOLN: 10.0000 [IU] | Freq: Three times a day (TID) | SUBCUTANEOUS | 11 refills | Status: DC

## 2019-12-03 MED ORDER — GLUCOSE BLOOD VI STRP: 1.0000 | ORAL_STRIP | Freq: Four times a day (QID) | 11 refills | Status: DC

## 2019-12-03 MED ORDER — LEVOTHYROXINE SODIUM 112 MCG PO TABS: 112.0000 ug | ORAL_TABLET | Freq: Every day | ORAL | 0 refills | Status: DC

## 2019-12-03 MED ORDER — PANTOPRAZOLE SODIUM 40 MG PO TBEC: 40.0000 mg | DELAYED_RELEASE_TABLET | Freq: Every day | ORAL | 3 refills | Status: DC

## 2019-12-03 MED ORDER — METFORMIN HCL ER 500 MG PO TB24: 500.0000 mg | ORAL_TABLET | Freq: Every day | ORAL | 3 refills | Status: DC

## 2019-12-03 MED ORDER — GLOBAL INJECT EASE LANCETS 30G MISC: 1.0000 | Freq: Four times a day (QID) | 11 refills | Status: DC

## 2019-12-03 MED ORDER — INSULIN GLARGINE 100 UNIT/ML ~~LOC~~ SOLN: 20.0000 [IU] | Freq: Every day | SUBCUTANEOUS | 11 refills | Status: DC

## 2019-12-03 NOTE — Progress Notes (Signed)
BP (!) 203/131   Pulse 91   Temp 99.1 F (37.3 C) (Temporal)   Ht 5' 4" (1.626 m)   Wt 234 lb 9.6 oz (106.4 kg)   SpO2 95%   BMI 40.27 kg/m    Subjective:   Patient ID: Tina Chapman, female    DOB: [birth year], 54 y.o.   MRN: [id number]  HPI: Tyrene Nader is a 54 y.o. female presenting on 12/03/2019 for Hospitalization Follow-up (2 week follow up- DM), Headache, and Blurred Vision   HPI Hospital follow-up for new onset diabetes and DKA Patient is coming in today for hospital follow-up for new onset diabetes and DKA, she is having a lot of blurred vision associated with that she just feels fatigued and weak.  She has been doing the insulins and does feel like she is starting to get the blood sugars under better control and most the time is in the 100 range.  She is not having any lows and not having many over 200.  She is really adjusted her diet to follow the recommendations that they have.  She says her vision is very blurred and she mainly sees shapes.  She has not seen an eye doctor yet and says that she cannot get an appointment till March.  She was started on Metformin.  She is having some headache still, mainly in the front but also associated with some pain behind her temples on both sides.  Her blood pressure is very elevated today as well.  We discussed a lot about diabetes and blood pressure and diet and keeping things under control.  We discussed anxiety and depression as well and she just feeling down and just overwhelmed with everything that is been going on and she just feels like she is in a very sad place plus she is also going through disability because of her back pain and she cannot take care of herself anymore and her son is having to take care of her a lot more and she just feels upset about that.  She was on Zoloft before but has not been taking it in some time since leaving the hospital she wants to know she can restart it.  She denies any suicidal ideations  but does have thoughts of that she might be better off dead. Depression screen [facility] 2023/01/05 12/03/2019 04/30/2019 03/28/2019 11/19/2018 08/23/2018  Decreased Interest 3 0 0 0 0  Down, Depressed, Hopeless 3 0 0 0 0  PHQ - 2 Score 6 0 0 0 0  Altered sleeping 3 - - - -  Tired, decreased energy 3 - - - -  Change in appetite 3 - - - -  Feeling bad or failure about yourself  3 - - - -  Trouble concentrating 3 - - - -  Moving slowly or fidgety/restless 3 - - - -  Suicidal thoughts 1 - - - -  PHQ-9 Score 25 - - - -  Difficult doing work/chores Extremely dIfficult - - - -    Hypothyroidism recheck Patient is coming in for thyroid recheck today as well. They deny any issues with hair changes or heat or cold problems or diarrhea or constipation. They deny any chest pain or palpitations. They are currently on levothyroxine 125mcrograms, this is also new to her  Relevant past medical, surgical, family and social history reviewed and updated as indicated. Interim medical history since our last visit reviewed. Allergies and medications reviewed and updated.  Review of Systems  Constitutional: Positive  for fatigue. Negative for chills and fever.  Eyes: Negative for visual disturbance.  Respiratory: Negative for chest tightness and shortness of breath.   Cardiovascular: Negative for chest pain and leg swelling.  Genitourinary: Negative for difficulty urinating and dysuria.  Musculoskeletal: Negative for back pain and gait problem.  Skin: Negative for rash.  Neurological: Positive for headaches. Negative for light-headedness.  Psychiatric/Behavioral: Positive for decreased concentration and dysphoric mood. Negative for agitation, behavioral problems, self-injury, sleep disturbance and suicidal ideas. The patient is nervous/anxious.   All other systems reviewed and are negative.   Per HPI unless specifically indicated above   Allergies as of 12/03/2019   No Known Allergies     Medication List        Accurate as of December 03, 2019  9:09 AM. If you have any questions, ask your nurse or doctor.        STOP taking these medications   cyclobenzaprine 10 MG tablet Commonly known as: FLEXERIL Stopped by: Worthy Rancher, MD   diclofenac 75 MG EC tablet Commonly known as: VOLTAREN Stopped by: Fransisca Kaufmann , MD   diclofenac sodium 1 % Gel Commonly known as: Voltaren Stopped by: Fransisca Kaufmann , MD   ondansetron 4 MG disintegrating tablet Commonly known as: Zofran ODT Stopped by: Fransisca Kaufmann , MD   ondansetron 4 MG tablet Commonly known as: Zofran Stopped by: Worthy Rancher, MD   sertraline 50 MG tablet Commonly known as: ZOLOFT Stopped by: Worthy Rancher, MD     TAKE these medications   fluticasone 50 MCG/ACT nasal spray Commonly known as: FLONASE Place 1 spray into both nostrils 2 (two) times daily as needed for allergies or rhinitis.   gabapentin 100 MG capsule Commonly known as: NEURONTIN Take 1 capsule (100 mg total) by mouth 3 (three) times daily.   Global Inject Ease Insulin Syr 31G X 5/16" 1 ML Misc Generic drug: Insulin Syringe-Needle U-100 by Does not apply route.   Global Inject Ease Lancets 30G Misc by Does not apply route 4 (four) times daily.   glucose blood test strip 1 each by Other route 4 (four) times daily. True metrix   insulin glargine 100 UNIT/ML injection Commonly known as: LANTUS Inject 20 Units into the skin daily. Daily at bedtime   levothyroxine 112 MCG tablet Commonly known as: SYNTHROID Take 1 tablet (112 mcg total) by mouth daily before breakfast. (Needs to be seen before next refill)   lisinopril 10 MG tablet Commonly known as: ZESTRIL Take 1 tablet (10 mg total) by mouth daily.   metFORMIN 500 MG tablet Commonly known as: GLUCOPHAGE Take by mouth 2 (two) times daily with a meal.   NovoLOG 100 UNIT/ML injection Generic drug: insulin aspart Inject 10 Units into the skin 3 (three) times daily before  meals.   pantoprazole 40 MG tablet Commonly known as: PROTONIX Take 40 mg by mouth daily.        Objective:   BP (!) 203/131   Pulse 91   Temp 99.1 F (37.3 C) (Temporal)   Ht 5' 4" (1.626 m)   Wt 234 lb 9.6 oz (106.4 kg)   SpO2 95%   BMI 40.27 kg/m   Wt Readings from Last 3 Encounters:  12/03/19 234 lb 9.6 oz (106.4 kg)  04/30/19 242 lb 9.6 oz (110 kg)  03/28/19 237 lb 9.6 oz (107.8 kg)    Physical Exam Vitals and nursing note reviewed.  Constitutional:      General: She is  not in acute distress.    Appearance: She is well-developed. She is not diaphoretic.  Eyes:     Extraocular Movements: Extraocular movements intact.     Conjunctiva/sclera: Conjunctivae normal.     Pupils: Pupils are equal, round, and reactive to light.  Cardiovascular:     Rate and Rhythm: Normal rate and regular rhythm.     Heart sounds: Normal heart sounds. No murmur.  Pulmonary:     Effort: Pulmonary effort is normal. No respiratory distress.     Breath sounds: Normal breath sounds. No wheezing.  Musculoskeletal:        General: Tenderness (Bilateral low back pain) present. Normal range of motion.  Skin:    General: Skin is warm and dry.     Findings: No rash.  Neurological:     Mental Status: She is alert and oriented to person, place, and time.     Coordination: Coordination normal.  Psychiatric:        Mood and Affect: Mood is anxious and depressed.        Behavior: Behavior normal.        Thought Content: Thought content does not include suicidal ideation. Thought content does not include suicidal plan.       Assessment & Plan:   Problem List Items Addressed This Visit      Cardiovascular and Mediastinum   Hypertension     Endocrine   Hypothyroidism (acquired)   Relevant Orders   CBC with Differential/Platelet (Completed)   TSH (Completed)   Diabetes mellitus, type II, insulin dependent (HCC) - Primary   Relevant Medications   insulin aspart (NOVOLOG) 100 UNIT/ML  injection   insulin glargine (LANTUS) 100 UNIT/ML injection   metFORMIN (GLUCOPHAGE XR) 500 MG 24 hr tablet   Other Relevant Orders   Ambulatory referral to diabetic education   CMP14+EGFR (Completed)   Lipid panel (Completed)   Bayer DCA Hb A1c Waived (Completed)     Other   Anxiety   Relevant Medications   sertraline (ZOLOFT) 50 MG tablet   Other Relevant Orders   CBC with Differential/Platelet (Completed)   Depression, recurrent (HCC)   Relevant Medications   sertraline (ZOLOFT) 50 MG tablet   Other Relevant Orders   CBC with Differential/Platelet (Completed)    Other Visit Diagnoses    Blurred vision, bilateral       Relevant Orders   Ambulatory referral to Ophthalmology      Patient was started on Metformin from the hospital, she is having significant diarrhea so we will change her to extended release.  She says she did not take her blood pressure pill today, she is also on insulin currently and we will keep her on the current dose of insulin, her blood sugars at home look to be running good between 90s and 170 mostly in the low 100s up to 130.  She was increased on her dose of levothyroxine to 112 mcg and we will recheck that today, it may be too soon for adjustment but will check to see where its at.  She is depressed because of everything that is been going on and she is also applying for disability because of her back, we will start her sertraline that she has not been taking because of the depression.  Blood pressure initially elevated but came down to 153/99 while here, she had just taken her lisinopril right before walking in, we will not adjust her lisinopril at this point but will have close follow-up in a  couple weeks  Follow up plan: Return in about 3 weeks (around 12/24/2019), or if symptoms worsen or fail to improve, for 2 to 4-week follow-up for diabetes and depression.  Counseling provided for all of the vaccine components No orders of the defined types were  placed in this encounter.   Caryl Pina, MD [facility] 12/03/2019, 9:09 AM

## 2019-12-04 LAB — CMP14+EGFR
ALT: 54 IU/L — ABNORMAL HIGH (ref 0–32)
AST: 56 IU/L — ABNORMAL HIGH (ref 0–40)
Albumin/Globulin Ratio: 1.6 (ref 1.2–2.2)
Albumin: 4.6 g/dL (ref 3.8–4.9)
Alkaline Phosphatase: 98 IU/L (ref 39–117)
BUN/Creatinine Ratio: 12 (ref 9–23)
BUN: 7 mg/dL (ref 6–24)
Bilirubin Total: 0.8 mg/dL (ref 0.0–1.2)
CO2: 23 mmol/L (ref 20–29)
Calcium: 10 mg/dL (ref 8.7–10.2)
Chloride: 102 mmol/L (ref 96–106)
Creatinine, Ser: 0.6 mg/dL (ref 0.57–1.00)
GFR calc Af Amer: 121 mL/min/{1.73_m2} (ref >59)
GFR calc non Af Amer: 105 mL/min/{1.73_m2} (ref >59)
Globulin, Total: 2.8 g/dL (ref 1.5–4.5)
Glucose: 104 mg/dL — ABNORMAL HIGH (ref 65–99)
Potassium: 4.5 mmol/L (ref 3.5–5.2)
Sodium: 140 mmol/L (ref 134–144)
Total Protein: 7.4 g/dL (ref 6.0–8.5)

## 2019-12-04 LAB — CBC WITH DIFFERENTIAL/PLATELET
Basophils Absolute: 0.1 10*3/uL (ref 0.0–0.2)
Basos: 2 %
EOS (ABSOLUTE): 0.5 10*3/uL — ABNORMAL HIGH (ref 0.0–0.4)
Eos: 8 %
Hematocrit: 44.8 % (ref 34.0–46.6)
Hemoglobin: 15.1 g/dL (ref 11.1–15.9)
Immature Grans (Abs): 0 10*3/uL (ref 0.0–0.1)
Immature Granulocytes: 0 %
Lymphocytes Absolute: 2.4 10*3/uL (ref 0.7–3.1)
Lymphs: 40 %
MCH: 31.6 pg (ref 26.6–33.0)
MCHC: 33.7 g/dL (ref 31.5–35.7)
MCV: 94 fL (ref 79–97)
Monocytes Absolute: 0.6 10*3/uL (ref 0.1–0.9)
Monocytes: 9 %
Neutrophils Absolute: 2.5 10*3/uL (ref 1.4–7.0)
Neutrophils: 41 %
Platelets: 336 10*3/uL (ref 150–450)
RBC: 4.78 x10E6/uL (ref 3.77–5.28)
RDW: 12.7 % (ref 11.7–15.4)
WBC: 6.1 10*3/uL (ref 3.4–10.8)

## 2019-12-04 LAB — LIPID PANEL
Chol/HDL Ratio: 3.8 ratio (ref 0.0–4.4)
Cholesterol, Total: 187 mg/dL (ref 100–199)
HDL: 49 mg/dL (ref >39)
LDL Chol Calc (NIH): 106 mg/dL — ABNORMAL HIGH (ref 0–99)
Triglycerides: 185 mg/dL — ABNORMAL HIGH (ref 0–149)
VLDL Cholesterol Cal: 32 mg/dL (ref 5–40)

## 2019-12-04 LAB — TSH: TSH: 10 u[IU]/mL — ABNORMAL HIGH (ref 0.450–4.500)

## 2019-12-05 ENCOUNTER — Telehealth: Payer: Self-pay | Admitting: Family Medicine

## 2019-12-05 MED ORDER — INSULIN ASPART 100 UNIT/ML ~~LOC~~ SOLN: 10.0000 [IU] | Freq: Three times a day (TID) | SUBCUTANEOUS | 11 refills | Status: DC

## 2019-12-05 MED ORDER — INSULIN GLARGINE 100 UNIT/ML ~~LOC~~ SOLN: 20.0000 [IU] | Freq: Every day | SUBCUTANEOUS | 11 refills | Status: DC

## 2019-12-05 NOTE — Telephone Encounter (Signed)
Patient aware she has refills at pharmacy

## 2019-12-08 ENCOUNTER — Other Ambulatory Visit: Payer: Self-pay | Admitting: Family Medicine

## 2019-12-08 NOTE — Telephone Encounter (Signed)
Last tsh high, does patient need to continue on current dose

## 2019-12-09 ENCOUNTER — Other Ambulatory Visit: Payer: Self-pay | Admitting: Family Medicine

## 2019-12-09 ENCOUNTER — Ambulatory Visit: Payer: BC Managed Care – PPO | Admitting: Nurse Practitioner

## 2019-12-09 MED ORDER — LEVOTHYROXINE SODIUM 112 MCG PO TABS: 112.0000 ug | ORAL_TABLET | Freq: Every day | ORAL | 0 refills | Status: DC

## 2019-12-09 MED ORDER — LEVOTHYROXINE SODIUM 125 MCG PO TABS: 125.0000 ug | ORAL_TABLET | Freq: Every day | ORAL | 3 refills | Status: DC

## 2019-12-09 NOTE — Progress Notes (Signed)
Attempted to contact patient - NA °

## 2019-12-09 NOTE — Progress Notes (Signed)
Please let the patient know that I have adjusted her thyroid as it is still not quite to the level that we need, have sent a new prescription of levothyroxine 125 mcg, her blood sugars are very elevated with an A1c of 12.0, she really needs to do the things that we talked about, she is already making strides in the right direction so I am imagining that the next time it will be a lot better so just keep doing the healthy thinks that she was already talking about.  The rest of her blood work looks pretty good Arville Care, MD [facility] 12/09/2019, 9:47 AM

## 2019-12-11 ENCOUNTER — Encounter: Payer: Self-pay | Admitting: Family Medicine

## 2019-12-19 ENCOUNTER — Encounter: Payer: Self-pay | Admitting: Family Medicine

## 2019-12-22 ENCOUNTER — Encounter: Payer: Self-pay | Admitting: Family Medicine

## 2019-12-26 ENCOUNTER — Telehealth: Payer: Self-pay | Admitting: Family Medicine

## 2019-12-26 NOTE — Telephone Encounter (Signed)
Pt called to see if her FMLA paperwork had been filled out. Pt would like to be called once the paperwork is filled out and would like it uploaded to her My Chart if possible.

## 2019-12-26 NOTE — Telephone Encounter (Signed)
Please advise patient.  Sent you a message about this one.

## 2019-12-26 NOTE — Telephone Encounter (Signed)
Advised patient that paperwork was still being worked on and we would contact her as soon as its ready. Patient verbalized understanding

## 2019-12-29 ENCOUNTER — Encounter: Payer: Self-pay | Admitting: Family Medicine

## 2020-01-02 ENCOUNTER — Ambulatory Visit (INDEPENDENT_AMBULATORY_CARE_PROVIDER_SITE_OTHER): Payer: BC Managed Care – PPO | Admitting: Family Medicine

## 2020-01-02 ENCOUNTER — Other Ambulatory Visit: Payer: Self-pay

## 2020-01-02 NOTE — Progress Notes (Signed)
Attempted to call patient, voicemail was not set up, attempted to call again and no answer. Arville Care, MD [facility] 01/02/2020, 8:37 AM

## 2020-01-09 NOTE — Telephone Encounter (Signed)
Do something need to be added to this documents or can I just write a letter with no lifting no more than 5 lbs>?

## 2020-01-16 ENCOUNTER — Encounter: Payer: Self-pay | Admitting: Family Medicine

## 2020-01-21 MED ORDER — BACLOFEN 10 MG PO TABS: 10.0000 mg | ORAL_TABLET | Freq: Every evening | ORAL | 1 refills | Status: DC | PRN

## 2020-01-29 ENCOUNTER — Encounter: Payer: Self-pay | Admitting: Family Medicine

## 2020-02-06 ENCOUNTER — Encounter: Payer: Self-pay | Admitting: Family Medicine

## 2020-02-06 ENCOUNTER — Telehealth: Payer: Self-pay | Admitting: Family Medicine

## 2020-02-06 MED ORDER — LISINOPRIL 20 MG PO TABS: 10.0000 mg | ORAL_TABLET | Freq: Every day | ORAL | 3 refills | Status: DC

## 2020-02-06 MED ORDER — LISINOPRIL 20 MG PO TABS: 20.0000 mg | ORAL_TABLET | Freq: Every day | ORAL | 3 refills | Status: DC

## 2020-02-06 NOTE — Addendum Note (Signed)
Addended by: Arville Care on: 02/06/2020 04:43 PM   Modules accepted: Orders

## 2020-02-06 NOTE — Telephone Encounter (Signed)
Sent the prescription to [facility] for the patient.

## 2020-02-06 NOTE — Telephone Encounter (Signed)
No answer, voicemail not set up. 

## 2020-02-09 NOTE — Telephone Encounter (Signed)
Refer to [facility] encounter - this encounter will be closed.

## 2020-02-11 ENCOUNTER — Encounter: Payer: Self-pay | Admitting: Family Medicine

## 2020-02-11 ENCOUNTER — Ambulatory Visit (INDEPENDENT_AMBULATORY_CARE_PROVIDER_SITE_OTHER): Payer: BC Managed Care – PPO

## 2020-02-11 ENCOUNTER — Ambulatory Visit (INDEPENDENT_AMBULATORY_CARE_PROVIDER_SITE_OTHER): Payer: BC Managed Care – PPO | Admitting: Family Medicine

## 2020-02-11 ENCOUNTER — Other Ambulatory Visit: Payer: Self-pay

## 2020-02-11 VITALS — BP 114/79 | HR 110 | Temp 97.1°F | Ht 64.0 in | Wt 222.0 lb

## 2020-02-11 DIAGNOSIS — R600 Localized edema: Secondary | ICD-10-CM | POA: Diagnosis not present

## 2020-02-11 DIAGNOSIS — G8929 Other chronic pain: Secondary | ICD-10-CM

## 2020-02-11 DIAGNOSIS — K5909 Other constipation: Secondary | ICD-10-CM

## 2020-02-11 DIAGNOSIS — M5442 Lumbago with sciatica, left side: Secondary | ICD-10-CM

## 2020-02-11 DIAGNOSIS — R7989 Other specified abnormal findings of blood chemistry: Secondary | ICD-10-CM

## 2020-02-11 DIAGNOSIS — R109 Unspecified abdominal pain: Secondary | ICD-10-CM

## 2020-02-11 DIAGNOSIS — N3001 Acute cystitis with hematuria: Secondary | ICD-10-CM

## 2020-02-11 LAB — URINALYSIS, COMPLETE
Bilirubin, UA: NEGATIVE
Glucose, UA: NEGATIVE
Ketones, UA: NEGATIVE
Nitrite, UA: NEGATIVE
Protein,UA: NEGATIVE
Specific Gravity, UA: 1.025 (ref 1.005–1.030)
Urobilinogen, Ur: 0.2 mg/dL (ref 0.2–1.0)
pH, UA: 5 (ref 5.0–7.5)

## 2020-02-11 LAB — CMP14+EGFR
ALT: 38 IU/L — ABNORMAL HIGH (ref 0–32)
AST: 27 IU/L (ref 0–40)
Albumin/Globulin Ratio: 1.5 (ref 1.2–2.2)
Albumin: 4.6 g/dL (ref 3.8–4.9)
Alkaline Phosphatase: 122 IU/L — ABNORMAL HIGH (ref 39–117)
BUN/Creatinine Ratio: 31 — ABNORMAL HIGH (ref 9–23)
BUN: 24 mg/dL (ref 6–24)
Bilirubin Total: 0.8 mg/dL (ref 0.0–1.2)
CO2: 22 mmol/L (ref 20–29)
Calcium: 10.3 mg/dL — ABNORMAL HIGH (ref 8.7–10.2)
Chloride: 102 mmol/L (ref 96–106)
Creatinine, Ser: 0.77 mg/dL (ref 0.57–1.00)
GFR calc Af Amer: 103 mL/min/{1.73_m2} (ref >59)
GFR calc non Af Amer: 89 mL/min/{1.73_m2} (ref >59)
Globulin, Total: 3 g/dL (ref 1.5–4.5)
Glucose: 127 mg/dL — ABNORMAL HIGH (ref 65–99)
Potassium: 4.6 mmol/L (ref 3.5–5.2)
Sodium: 140 mmol/L (ref 134–144)
Total Protein: 7.6 g/dL (ref 6.0–8.5)

## 2020-02-11 LAB — CBC WITH DIFFERENTIAL/PLATELET
Basophils Absolute: 0.1 10*3/uL (ref 0.0–0.2)
Basos: 1 %
EOS (ABSOLUTE): 0.6 10*3/uL — ABNORMAL HIGH (ref 0.0–0.4)
Eos: 7 %
Hematocrit: 44.7 % (ref 34.0–46.6)
Hemoglobin: 15.1 g/dL (ref 11.1–15.9)
Immature Grans (Abs): 0 10*3/uL (ref 0.0–0.1)
Immature Granulocytes: 0 %
Lymphocytes Absolute: 2.7 10*3/uL (ref 0.7–3.1)
Lymphs: 33 %
MCH: 30.3 pg (ref 26.6–33.0)
MCHC: 33.8 g/dL (ref 31.5–35.7)
MCV: 90 fL (ref 79–97)
Monocytes Absolute: 0.6 10*3/uL (ref 0.1–0.9)
Monocytes: 7 %
Neutrophils Absolute: 4.4 10*3/uL (ref 1.4–7.0)
Neutrophils: 52 %
Platelets: 334 10*3/uL (ref 150–450)
RBC: 4.98 x10E6/uL (ref 3.77–5.28)
RDW: 12.2 % (ref 11.7–15.4)
WBC: 8.4 10*3/uL (ref 3.4–10.8)

## 2020-02-11 LAB — MICROSCOPIC EXAMINATION
Epithelial Cells (non renal): >10 /hpf — AB (ref 0–10)
Renal Epithel, UA: NONE SEEN /hpf

## 2020-02-11 MED ORDER — POLYETHYLENE GLYCOL 3350 17 GM/SCOOP PO POWD: 17.0000 g | Freq: Two times a day (BID) | ORAL | 1 refills | Status: DC | PRN

## 2020-02-11 MED ORDER — KETOROLAC TROMETHAMINE 60 MG/2ML IM SOLN: 30.0000 mg | Freq: Once | INTRAMUSCULAR | Status: DC

## 2020-02-11 MED ORDER — KETOROLAC TROMETHAMINE 60 MG/2ML IM SOLN
30.0000 mg | Freq: Once | INTRAMUSCULAR | Status: AC
  Administered 2020-02-11: 30 mg via INTRAMUSCULAR

## 2020-02-11 MED ORDER — CEPHALEXIN 500 MG PO CAPS: 500.0000 mg | ORAL_CAPSULE | Freq: Three times a day (TID) | ORAL | 0 refills | Status: AC

## 2020-02-11 NOTE — Patient Instructions (Addendum)
Abdominal Pain, Adult Many things can cause belly (abdominal) pain. Most times, belly pain is not dangerous. Many cases of belly pain can be watched and treated at home. Sometimes, though, belly pain is serious. Your doctor will try to find the cause of your belly pain. Follow these instructions at home:  Medicines  Take over-the-counter and prescription medicines only as told by your doctor.  Do not take medicines that help you poop (laxatives) unless told by your doctor. General instructions  Watch your belly pain for any changes.  Drink enough fluid to keep your pee (urine) pale yellow.  Keep all follow-up visits as told by your doctor. This is important. Contact a doctor if:  Your belly pain changes or gets worse.  You are not hungry, or you lose weight without trying.  You are having trouble pooping (constipated) or have watery poop (diarrhea) for more than 2-3 days.  You have pain when you pee or poop.  Your belly pain wakes you up at night.  Your pain gets worse with meals, after eating, or with certain foods.  You are vomiting and cannot keep anything down.  You have a fever.  You have blood in your pee. Get help right away if:  Your pain does not go away as soon as your doctor says it should.  You cannot stop vomiting.  Your pain is only in areas of your belly, such as the right side or the left lower part of the belly.  You have bloody or black poop, or poop that looks like tar.  You have very bad pain, cramping, or bloating in your belly.  You have signs of not having enough fluid or water in your body (dehydration), such as: ? Dark pee, very little pee, or no pee. ? Cracked lips. ? Dry mouth. ? Sunken eyes. ? Sleepiness. ? Weakness.  You have trouble breathing or chest pain. Summary  Many cases of belly pain can be watched and treated at home.  Watch your belly pain for any changes.  Take over-the-counter and prescription medicines only as  told by your doctor.  Contact a doctor if your belly pain changes or gets worse.  Get help right away if you have very bad pain, cramping, or bloating in your belly. This information is not intended to replace advice given to you by your health care provider. Make sure you discuss any questions you have with your health care provider. Document Revised: 03/10/2019 Document Reviewed: 03/10/2019 Elsevier Patient Education  2020 [employer].  Thank you for coming in to clinic today.  1. Your symptoms are consistent with Constipation, likely cause of your General Abdominal Pain / Cramping. 2. Start with Miralax, prescription was sent to pharmacy. First dose 68g (4 capfuls) in 32oz water over 1 to 2 hours for clean out. Next day start 17g or 1 capful daily, may adjust dose up or down by half a capful every few days. Recommend to take this medicine daily for next 1-2 weeks, you may need to use it longer if needed. - Goal is to have soft regular bowel movement 1-3x daily, if too runny or diarrhea, then reduce dose of the medicine to every other day.  Improve water intake, hydration will help Also recommend increased vegetables, fruits, fiber intake Can try daily Metamucil or Fiber supplement at pharmacy over the counter  Follow-up if symptoms are not improving with bowel movements, or if pain worsens, develop fevers, nausea, vomiting.  Please schedule a follow-up appointment  with Kari Baars, FNP, in 1 month to follow-up Constipation  If you have any other questions or concerns, please feel free to call the clinic to contact me. You may also schedule an earlier appointment if necessary.  However, if your symptoms get significantly worse, please go to the Emergency Department to seek immediate medical attention.

## 2020-02-11 NOTE — Addendum Note (Signed)
Addended by: Adella Hare B on: 02/11/2020 10:00 AM   Modules accepted: Orders

## 2020-02-11 NOTE — Progress Notes (Signed)
Subjective:  Patient ID: Tina Chapman, female    DOB: 04-Feb-1966, 54 y.o.   MRN: [id number]  Patient Care Team: Dettinger, Fransisca Kaufmann, MD as PCP - General (Family Medicine) Gala Romney Cristopher Estimable, MD as Consulting Physician (Gastroenterology)   Chief Complaint:  Joint Swelling (bilateral) and Abdominal Pain (right side)   HPI: Tina Chapman is a 54 y.o. female presenting on 02/11/2020 for Joint Swelling (bilateral) and Abdominal Pain (right side)   Pt presents today with ongoing lower back pain. States this is a chronic problem. States it is now worse in her right flank area. She reports the flank pain radiates to her lower abdomen. Has urinary frequency but no dysuria or hematuria. No confusion or fever. States she has had a few soft bowel movements over the last few days. She reports chronic widespread pain from her fibromyalgia. States this seems to be worsening. She does see Dr. Case for her pain. She reports she has lower extremity swelling at the end of the day. This is not present at this time. No chest pain, shortness of breath, orthopnea, or PND.   Relevant past medical, surgical, family, and social history reviewed and updated as indicated.  Allergies and medications reviewed and updated. Date reviewed: Chart in Epic.   Past Medical History:  Diagnosis Date  . Back pain   . Hypertension   . Sciatic nerve pain     Past Surgical History:  Procedure Laterality Date  . ABDOMINAL HYSTERECTOMY    . APPENDECTOMY    . CERVIX SURGERY    . CHOLECYSTECTOMY    . TONSILLECTOMY      Social History   Socioeconomic History  . Marital status: Divorced    Spouse name: Not on file  . Number of children: Not on file  . Years of education: Not on file  . Highest education level: Not on file  Occupational History  . Not on file  Tobacco Use  . Smoking status: Never Smoker  . Smokeless tobacco: Never Used  Substance and Sexual Activity  . Alcohol use: Never  . Drug use:  Never  . Sexual activity: Not Currently  Other Topics Concern  . Not on file  Social History Narrative   67 son 48 years old, here with her   Social Determinants of Health   Financial Resource Strain:   . Difficulty of Paying Living Expenses:   Food Insecurity:   . Worried About Charity fundraiser in the Last Year:   . Arboriculturist in the Last Year:   Transportation Needs:   . Film/video editor (Medical):   Marland Kitchen Lack of Transportation (Non-Medical):   Physical Activity:   . Days of Exercise per Week:   . Minutes of Exercise per Session:   Stress:   . Feeling of Stress :   Social Connections:   . Frequency of Communication with Friends and Family:   . Frequency of Social Gatherings with Friends and Family:   . Attends Religious Services:   . Active Member of Clubs or Organizations:   . Attends Archivist Meetings:   Marland Kitchen Marital Status:   Intimate Partner Violence:   . Fear of Current or Ex-Partner:   . Emotionally Abused:   Marland Kitchen Physically Abused:   . Sexually Abused:     Outpatient Encounter Medications as of 02/11/2020  Medication Sig  . baclofen (LIORESAL) 10 MG tablet Take 1 tablet (10 mg total) by mouth at bedtime as needed  for muscle spasms.  . fluticasone (FLONASE) 50 MCG/ACT nasal spray Place 1 spray into both nostrils 2 (two) times daily as needed for allergies or rhinitis.  Marland Kitchen gabapentin (NEURONTIN) 100 MG capsule Take 1 capsule (100 mg total) by mouth 3 (three) times daily.  . Global Inject Ease Lancets 30G MISC 1 each by Does not apply route 4 (four) times daily.  Marland Kitchen glucose blood test strip 1 each by Other route 4 (four) times daily. True metrix  . insulin aspart (NOVOLOG) 100 UNIT/ML injection Inject 10 Units into the skin 3 (three) times daily before meals.  . insulin glargine (LANTUS) 100 UNIT/ML injection Inject 0.2 mLs (20 Units total) into the skin daily. Daily at bedtime  . Insulin Syringe-Needle U-100 (GLOBAL INJECT EASE INSULIN SYR) 31G X 5/16"  1 ML MISC 1 each by Does not apply route 4 (four) times daily.  Marland Kitchen levothyroxine (SYNTHROID) 125 MCG tablet Take 1 tablet (125 mcg total) by mouth daily. Discontinue any previous doses, this is the new dose  . lisinopril (ZESTRIL) 20 MG tablet Take 1 tablet (20 mg total) by mouth daily.  . metFORMIN (GLUCOPHAGE XR) 500 MG 24 hr tablet Take 1 tablet (500 mg total) by mouth daily with breakfast.  . pantoprazole (PROTONIX) 40 MG tablet Take 1 tablet (40 mg total) by mouth daily.  . sertraline (ZOLOFT) 50 MG tablet Take 1 tablet (50 mg total) by mouth daily.  . cephALEXin (KEFLEX) 500 MG capsule Take 1 capsule (500 mg total) by mouth 3 (three) times daily for 7 days.  . polyethylene glycol powder (GLYCOLAX/MIRALAX) 17 GM/SCOOP powder Take 17 g by mouth 2 (two) times daily as needed.   Facility-Administered Encounter Medications as of 02/11/2020  Medication  . ketorolac (TORADOL) injection 30 mg    No Known Allergies  Review of Systems  Constitutional: Positive for activity change and fatigue. Negative for appetite change, chills, diaphoresis, fever and unexpected weight change.  HENT: Negative.   Eyes: Negative.  Negative for photophobia and visual disturbance.  Respiratory: Negative for cough, chest tightness and shortness of breath.   Cardiovascular: Positive for leg swelling. Negative for chest pain and palpitations.  Gastrointestinal: Positive for abdominal pain. Negative for anal bleeding, blood in stool, constipation, diarrhea, nausea, rectal pain and vomiting.  Endocrine: Negative.   Genitourinary: Positive for flank pain. Negative for decreased urine volume, difficulty urinating, dysuria, frequency, hematuria and urgency.  Musculoskeletal: Positive for back pain and gait problem. Negative for arthralgias and myalgias.  Skin: Negative.   Allergic/Immunologic: Negative.   Neurological: Positive for tremors and weakness (generalized). Negative for dizziness, seizures, syncope, facial  asymmetry, speech difficulty, light-headedness, numbness and headaches.  Hematological: Negative.   Psychiatric/Behavioral: Negative for confusion, decreased concentration, hallucinations, sleep disturbance and suicidal ideas.  All other systems reviewed and are negative.       Objective:  BP 114/79   Pulse (!) 110   Temp (!) 97.1 F (36.2 C)   Ht _0  (1.626 m)   Wt 222 lb (100.7 kg)   SpO2 98%   BMI 38.11 kg/m    Wt Readings from Last 3 Encounters:  02/11/20 222 lb (100.7 kg)  12/03/19 234 lb 9.6 oz (106.4 kg)  04/30/19 242 lb 9.6 oz (110 kg)    Physical Exam Vitals and nursing note reviewed.  Constitutional:      General: She is not in acute distress.    Appearance: Normal appearance. She is well-developed and well-groomed. She is morbidly obese. She is  not ill-appearing, toxic-appearing or diaphoretic.  HENT:     Head: Normocephalic and atraumatic.     Jaw: There is normal jaw occlusion.     Right Ear: Hearing normal.     Left Ear: Hearing normal.     Nose: Nose normal.     Mouth/Throat:     Lips: Pink.     Mouth: Mucous membranes are moist.     Pharynx: Oropharynx is clear. Uvula midline.  Eyes:     General: Lids are normal.     Extraocular Movements: Extraocular movements intact.     Conjunctiva/sclera: Conjunctivae normal.     Pupils: Pupils are equal, round, and reactive to light.  Neck:     Thyroid: No thyroid mass, thyromegaly or thyroid tenderness.     Vascular: No carotid bruit or JVD.     Trachea: Trachea and phonation normal.  Cardiovascular:     Rate and Rhythm: Normal rate and regular rhythm.     Chest Wall: PMI is not displaced.     Pulses: Normal pulses.     Heart sounds: Normal heart sounds. No murmur. No friction rub. No gallop.   Pulmonary:     Effort: Pulmonary effort is normal. No respiratory distress.     Breath sounds: Normal breath sounds. No wheezing.  Abdominal:     General: Abdomen is protuberant. Bowel sounds are normal. There  is no distension or abdominal bruit.     Palpations: Abdomen is soft. There is no hepatomegaly or splenomegaly.     Tenderness: There is generalized abdominal tenderness. There is guarding. There is no right CVA tenderness, left CVA tenderness or rebound. Negative signs include Murphy's sign, Rovsing's sign and McBurney's sign.     Hernia: No hernia is present.  Musculoskeletal:     Cervical back: Normal range of motion and neck supple.     Thoracic back: Normal.     Lumbar back: Tenderness present. No swelling, edema, deformity, signs of trauma, lacerations, spasms or bony tenderness. Decreased range of motion. Negative right straight leg raise test and negative left straight leg raise test. No scoliosis.     Right hip: Normal.     Left hip: Normal.     Right lower leg: No edema.     Left lower leg: No edema.  Lymphadenopathy:     Cervical: No cervical adenopathy.  Skin:    General: Skin is warm and dry.     Capillary Refill: Capillary refill takes less than 2 seconds.     Coloration: Skin is not cyanotic, jaundiced or pale.     Findings: No rash.  Neurological:     General: No focal deficit present.     Mental Status: She is alert and oriented to person, place, and time.     Cranial Nerves: Cranial nerves are intact. No cranial nerve deficit.     Sensory: Sensation is intact. No sensory deficit.     Motor: Motor function is intact. No weakness.     Coordination: Coordination is intact. Coordination normal.     Gait: Gait abnormal (antalgic).     Deep Tendon Reflexes: Reflexes are normal and symmetric. Reflexes normal.  Psychiatric:        Attention and Perception: Attention and perception normal.        Mood and Affect: Mood and affect normal.        Speech: Speech normal.        Behavior: Behavior normal. Behavior is cooperative.  Thought Content: Thought content normal.        Cognition and Memory: Cognition and memory normal.        Judgment: Judgment normal.      Results for orders placed or performed in visit on 12/03/19  CBC with Differential/Platelet  Result Value Ref Range   WBC 6.1 3.4 - 10.8 x10E3/uL   RBC 4.78 3.77 - 5.28 x10E6/uL   Hemoglobin 15.1 11.1 - 15.9 g/dL   Hematocrit 44.8 34.0 - 46.6 %   MCV 94 79 - 97 fL   MCH 31.6 26.6 - 33.0 pg   MCHC 33.7 31.5 - 35.7 g/dL   RDW 12.7 11.7 - 15.4 %   Platelets 336 150 - 450 x10E3/uL   Neutrophils 41 Not Estab. %   Lymphs 40 Not Estab. %   Monocytes 9 Not Estab. %   Eos 8 Not Estab. %   Basos 2 Not Estab. %   Neutrophils Absolute 2.5 1.4 - 7.0 x10E3/uL   Lymphocytes Absolute 2.4 0.7 - 3.1 x10E3/uL   Monocytes Absolute 0.6 0.1 - 0.9 x10E3/uL   EOS (ABSOLUTE) 0.5 (H) 0.0 - 0.4 x10E3/uL   Basophils Absolute 0.1 0.0 - 0.2 x10E3/uL   Immature Granulocytes 0 Not Estab. %   Immature Grans (Abs) 0.0 0.0 - 0.1 x10E3/uL  CMP14+EGFR  Result Value Ref Range   Glucose 104 (H) 65 - 99 mg/dL   BUN 7 6 - 24 mg/dL   Creatinine, Ser 0.60 0.57 - 1.00 mg/dL   GFR calc non Af Amer 105 >59 mL/min/1.73   GFR calc Af Amer 121 >59 mL/min/1.73   BUN/Creatinine Ratio 12 9 - 23   Sodium 140 134 - 144 mmol/L   Potassium 4.5 3.5 - 5.2 mmol/L   Chloride 102 96 - 106 mmol/L   CO2 23 20 - 29 mmol/L   Calcium 10.0 8.7 - 10.2 mg/dL   Total Protein 7.4 6.0 - 8.5 g/dL   Albumin 4.6 3.8 - 4.9 g/dL   Globulin, Total 2.8 1.5 - 4.5 g/dL   Albumin/Globulin Ratio 1.6 1.2 - 2.2   Bilirubin Total 0.8 0.0 - 1.2 mg/dL   Alkaline Phosphatase 98 39 - 117 IU/L   AST 56 (H) 0 - 40 IU/L   ALT 54 (H) 0 - 32 IU/L  Lipid panel  Result Value Ref Range   Cholesterol, Total 187 100 - 199 mg/dL   Triglycerides 185 (H) 0 - 149 mg/dL   HDL 49 >39 mg/dL   VLDL Cholesterol Cal 32 5 - 40 mg/dL   LDL Chol Calc (NIH) 106 (H) 0 - 99 mg/dL   Chol/HDL Ratio 3.8 0.0 - 4.4 ratio  Bayer DCA Hb A1c Waived  Result Value Ref Range   HB A1C (BAYER DCA - WAIVED) 12.0 (H) <7.0 %  TSH  Result Value Ref Range   TSH 10.000 (H) 0.450 -  4.500 uIU/mL     X-Ray: KUB: moderate stool burden, no renal calculi visible. Preliminary x-ray reading by Monia Pouch, FNP-C, [facility].  Urinalysis with 1+ leukocytes, trace blood, many bacteria, and yeast.   Pertinent labs & imaging results that were available during my care of the patient were reviewed by me and considered in my medical decision making.  Assessment & Plan:  Tina Chapman was seen today for joint swelling and abdominal pain.  Diagnoses and all orders for this visit:  Right flank pain Imaging negative for renal calculi but positive for moderate stool burden. No bowel obstruction noted. Will notify  pt if radiology reading differs.  -     Urinalysis, Complete -     DG Abd 1 View; Future -     CBC with Differential/Platelet -     CMP14+EGFR  Lower extremity edema Not present today. Will check below for anemia or renal insufficiency. Pt aware to prop legs when seated and  Avoid sodium in diet. Compression hose recommended.  -     CBC with Differential/Platelet -     CMP14+EGFR  Other constipation Miralax clean-out discussed in detail. Pt aware of symptoms that warrant emergent evaluation and treatment.  -     polyethylene glycol powder (GLYCOLAX/MIRALAX) 17 GM/SCOOP powder; Take 17 g by mouth 2 (two) times daily as needed.  Chronic bilateral low back pain with left-sided sciatica Ongoing pain. Aware to follow up with Dr. Case. No red flags present. Will dose with ketorolac today.  -     ketorolac (TORADOL) injection 30 mg  Acute cystitis with hematuria Symptomatic care discussed in detail. Medications as prescribed. Will change therapy ir culture warrants.  -     cephALEXin (KEFLEX) 500 MG capsule; Take 1 capsule (500 mg total) by mouth 3 (three) times daily for 7 days.     Continue all other maintenance medications.  Follow up plan: Return in about 2 weeks (around 02/25/2020), or if symptoms worsen or fail to improve, for PCP for abdominal pain.   Continue  healthy lifestyle choices, including diet (rich in fruits, vegetables, and lean proteins, and low in salt and simple carbohydrates) and exercise (at least 30 minutes of moderate physical activity daily).  Educational handout given for abdominal pain  The above assessment and management plan was discussed with the patient. The patient verbalized understanding of and has agreed to the management plan. Patient is aware to call the clinic if they develop any new symptoms or if symptoms persist or worsen. Patient is aware when to return to the clinic for a follow-up visit. Patient educated on when it is appropriate to go to the emergency department.   Monia Pouch, FNP-C [facility] [phone]

## 2020-02-12 NOTE — Progress Notes (Signed)
CBC looks good. Glucose elevated at 127.  Make sure to limit intake of sugary foods and drinks.  Alkaline phosphatase was elevated at 122.  ALT was slightly elevated at 38.  We need to recheck this fasting along with a GGT and alk phos isoenzymes.

## 2020-02-15 LAB — URINE CULTURE

## 2020-02-16 NOTE — Addendum Note (Signed)
Addended by: Adella Hare B on: 02/16/2020 10:58 AM   Modules accepted: Orders

## 2020-02-20 ENCOUNTER — Other Ambulatory Visit: Payer: BC Managed Care – PPO

## 2020-02-20 ENCOUNTER — Other Ambulatory Visit: Payer: Self-pay

## 2020-02-20 DIAGNOSIS — R7989 Other specified abnormal findings of blood chemistry: Secondary | ICD-10-CM

## 2020-02-25 ENCOUNTER — Ambulatory Visit (INDEPENDENT_AMBULATORY_CARE_PROVIDER_SITE_OTHER): Payer: BC Managed Care – PPO | Admitting: Family Medicine

## 2020-02-25 ENCOUNTER — Encounter: Payer: Self-pay | Admitting: Family Medicine

## 2020-02-25 DIAGNOSIS — F419 Anxiety disorder, unspecified: Secondary | ICD-10-CM

## 2020-02-25 DIAGNOSIS — R109 Unspecified abdominal pain: Secondary | ICD-10-CM

## 2020-02-25 DIAGNOSIS — M546 Pain in thoracic spine: Secondary | ICD-10-CM | POA: Diagnosis not present

## 2020-02-25 DIAGNOSIS — F339 Major depressive disorder, recurrent, unspecified: Secondary | ICD-10-CM

## 2020-02-25 DIAGNOSIS — G8929 Other chronic pain: Secondary | ICD-10-CM

## 2020-02-25 MED ORDER — SERTRALINE HCL 100 MG PO TABS: 100.0000 mg | ORAL_TABLET | Freq: Every day | ORAL | 3 refills | Status: DC

## 2020-02-25 NOTE — Progress Notes (Signed)
Virtual Visit via telephone Note  I connected with Tina Chapman on 02/25/20 at 1000 by telephone and verified that I am speaking with the correct person using two identifiers. Tina Chapman is currently located at home and no other people are currently with her during visit. The provider, Elige Radon Khang Hannum, MD is located in their office at time of visit.  Call ended at 1015  I discussed the limitations, risks, security and privacy concerns of performing an evaluation and management service by telephone and the availability of in person appointments. I also discussed with the patient that there may be a patient responsible charge related to this service. The patient expressed understanding and agreed to proceed.   History and Present Illness: Patient is still having pain on her right side and right flank and was treated for uti and laxative and is improved slightly on her side.  She still has some pain on the right side when she sleeps on that side and pressure on the right side under the ribs. When she leans to the right it makes the pain worse.  She is also having anxiety with her health issues and is trying for disability because of back pains. She is not sleeping either.   1. Right flank pain   2. Chronic right-sided thoracic back pain   3. Anxiety   4. Depression, recurrent [facility])     Outpatient Encounter Medications as of 02/25/2020  Medication Sig  . baclofen (LIORESAL) 10 MG tablet Take 1 tablet (10 mg total) by mouth at bedtime as needed for muscle spasms.  . fluticasone (FLONASE) 50 MCG/ACT nasal spray Place 1 spray into both nostrils 2 (two) times daily as needed for allergies or rhinitis.  Marland Kitchen gabapentin (NEURONTIN) 100 MG capsule Take 1 capsule (100 mg total) by mouth 3 (three) times daily.  . Global Inject Ease Lancets 30G MISC 1 each by Does not apply route 4 (four) times daily.  Marland Kitchen glucose blood test strip 1 each by Other route 4 (four) times daily. True metrix  .  insulin aspart (NOVOLOG) 100 UNIT/ML injection Inject 10 Units into the skin 3 (three) times daily before meals.  . insulin glargine (LANTUS) 100 UNIT/ML injection Inject 0.2 mLs (20 Units total) into the skin daily. Daily at bedtime  . Insulin Syringe-Needle U-100 (GLOBAL INJECT EASE INSULIN SYR) 31G X 5/16" 1 ML MISC 1 each by Does not apply route 4 (four) times daily.  Marland Kitchen levothyroxine (SYNTHROID) 125 MCG tablet Take 1 tablet (125 mcg total) by mouth daily. Discontinue any previous doses, this is the new dose  . lisinopril (ZESTRIL) 20 MG tablet Take 1 tablet (20 mg total) by mouth daily.  . metFORMIN (GLUCOPHAGE XR) 500 MG 24 hr tablet Take 1 tablet (500 mg total) by mouth daily with breakfast.  . pantoprazole (PROTONIX) 40 MG tablet Take 1 tablet (40 mg total) by mouth daily.  . polyethylene glycol powder (GLYCOLAX/MIRALAX) 17 GM/SCOOP powder Take 17 g by mouth 2 (two) times daily as needed.  . sertraline (ZOLOFT) 100 MG tablet Take 1 tablet (100 mg total) by mouth daily.  . [DISCONTINUED] sertraline (ZOLOFT) 50 MG tablet Take 1 tablet (50 mg total) by mouth daily.   No facility-administered encounter medications on file as of 02/25/2020.    Review of Systems  Constitutional: Negative for chills and fever.  Eyes: Negative for visual disturbance.  Respiratory: Negative for chest tightness and shortness of breath.   Cardiovascular: Negative for chest pain and leg swelling.  Musculoskeletal: Positive for back pain. Negative for arthralgias and gait problem.  Skin: Negative for rash.  Neurological: Positive for numbness. Negative for light-headedness and headaches.  Psychiatric/Behavioral: Negative for agitation and behavioral problems.  All other systems reviewed and are negative.   Observations/Objective: Patient sounds comfortable and in no acute distress  Assessment and Plan: Problem List Items Addressed This Visit      Other   Anxiety   Relevant Medications   sertraline  (ZOLOFT) 100 MG tablet   Depression, recurrent (HCC)   Relevant Medications   sertraline (ZOLOFT) 100 MG tablet    Other Visit Diagnoses    Right flank pain    -  Primary   Relevant Orders   Urinalysis, Complete   Urine Culture   Chronic right-sided thoracic back pain       Relevant Medications   sertraline (ZOLOFT) 100 MG tablet      Increased zoloft and will come in and leave urine sample to see if cleared. Follow up plan: Return in about 4 weeks (around 03/24/2020), or if symptoms worsen or fail to improve, for depression and anxiety.     I discussed the assessment and treatment plan with the patient. The patient was provided an opportunity to ask questions and all were answered. The patient agreed with the plan and demonstrated an understanding of the instructions.   The patient was advised to call back or seek an in-person evaluation if the symptoms worsen or if the condition fails to improve as anticipated.  The above assessment and management plan was discussed with the patient. The patient verbalized understanding of and has agreed to the management plan. Patient is aware to call the clinic if symptoms persist or worsen. Patient is aware when to return to the clinic for a follow-up visit. Patient educated on when it is appropriate to go to the emergency department.    I provided 15 minutes of non-face-to-face time during this encounter.    Worthy Rancher, MD

## 2020-02-26 ENCOUNTER — Other Ambulatory Visit: Payer: Self-pay

## 2020-02-26 ENCOUNTER — Other Ambulatory Visit: Payer: BC Managed Care – PPO

## 2020-02-26 DIAGNOSIS — R109 Unspecified abdominal pain: Secondary | ICD-10-CM

## 2020-02-26 LAB — CMP14+EGFR
ALT: 28 IU/L (ref 0–32)
AST: 25 IU/L (ref 0–40)
Albumin/Globulin Ratio: 1.5 (ref 1.2–2.2)
Albumin: 4 g/dL (ref 3.8–4.9)
Alkaline Phosphatase: 104 IU/L (ref 39–117)
BUN/Creatinine Ratio: 18 (ref 9–23)
BUN: 13 mg/dL (ref 6–24)
Bilirubin Total: 0.7 mg/dL (ref 0.0–1.2)
CO2: 23 mmol/L (ref 20–29)
Calcium: 9.7 mg/dL (ref 8.7–10.2)
Chloride: 109 mmol/L — ABNORMAL HIGH (ref 96–106)
Creatinine, Ser: 0.74 mg/dL (ref 0.57–1.00)
GFR calc Af Amer: 108 mL/min/{1.73_m2} (ref >59)
GFR calc non Af Amer: 93 mL/min/{1.73_m2} (ref >59)
Globulin, Total: 2.7 g/dL (ref 1.5–4.5)
Glucose: 107 mg/dL — ABNORMAL HIGH (ref 65–99)
Potassium: 4.4 mmol/L (ref 3.5–5.2)
Sodium: 145 mmol/L — ABNORMAL HIGH (ref 134–144)
Total Protein: 6.7 g/dL (ref 6.0–8.5)

## 2020-02-26 LAB — ALKALINE PHOSPHATASE, ISOENZYMES
BONE FRACTION: 24 % (ref 14–68)
INTESTINAL FRAC.: 28 % — ABNORMAL HIGH (ref 0–18)
LIVER FRACTION: 48 % (ref 18–85)

## 2020-02-26 LAB — MICROSCOPIC EXAMINATION
Epithelial Cells (non renal): >10 /hpf — AB (ref 0–10)
Renal Epithel, UA: NONE SEEN /hpf

## 2020-02-26 LAB — URINALYSIS, COMPLETE
Bilirubin, UA: NEGATIVE
Glucose, UA: NEGATIVE
Ketones, UA: NEGATIVE
Leukocytes,UA: NEGATIVE
Nitrite, UA: POSITIVE — AB
Protein,UA: NEGATIVE
RBC, UA: NEGATIVE
Specific Gravity, UA: 1.025 (ref 1.005–1.030)
Urobilinogen, Ur: 0.2 mg/dL (ref 0.2–1.0)
pH, UA: 5.5 (ref 5.0–7.5)

## 2020-02-26 LAB — GAMMA GT: GGT: 19 IU/L (ref 0–60)

## 2020-03-01 LAB — URINE CULTURE

## 2020-03-03 ENCOUNTER — Other Ambulatory Visit: Payer: Self-pay | Admitting: Family Medicine

## 2020-03-03 MED ORDER — CEPHALEXIN 500 MG PO CAPS: 500.0000 mg | ORAL_CAPSULE | Freq: Four times a day (QID) | ORAL | 0 refills | Status: DC

## 2020-03-16 ENCOUNTER — Other Ambulatory Visit: Payer: Self-pay | Admitting: Family Medicine

## 2020-04-02 ENCOUNTER — Telehealth: Payer: Self-pay | Admitting: Family Medicine

## 2020-04-02 NOTE — Telephone Encounter (Signed)
Tried calling patient. Phone did ring with no answer. There was no voicemail.   May schedule patient on Dettinger's on call day.

## 2020-04-05 NOTE — Telephone Encounter (Signed)
CALLED PATIENT, NO ANSWER, NO OPTION TO LEAVE MESSAGE 

## 2020-04-08 ENCOUNTER — Other Ambulatory Visit: Payer: Self-pay | Admitting: Family Medicine

## 2020-04-08 ENCOUNTER — Ambulatory Visit: Payer: BC Managed Care – PPO | Admitting: Family Medicine

## 2020-04-08 DIAGNOSIS — I959 Hypotension, unspecified: Secondary | ICD-10-CM

## 2020-04-09 NOTE — Telephone Encounter (Signed)
Please advise,

## 2020-04-19 ENCOUNTER — Encounter: Payer: Self-pay | Admitting: Family

## 2020-04-19 ENCOUNTER — Other Ambulatory Visit: Payer: Self-pay

## 2020-04-19 ENCOUNTER — Ambulatory Visit (INDEPENDENT_AMBULATORY_CARE_PROVIDER_SITE_OTHER): Payer: 59 | Admitting: Family

## 2020-04-19 VITALS — BP 136/85 | HR 92 | Temp 96.7°F | Ht 64.0 in | Wt 220.0 lb

## 2020-04-19 DIAGNOSIS — E039 Hypothyroidism, unspecified: Secondary | ICD-10-CM

## 2020-04-19 DIAGNOSIS — G8929 Other chronic pain: Secondary | ICD-10-CM

## 2020-04-19 DIAGNOSIS — M5442 Lumbago with sciatica, left side: Secondary | ICD-10-CM | POA: Diagnosis not present

## 2020-04-19 DIAGNOSIS — R42 Dizziness and giddiness: Secondary | ICD-10-CM | POA: Diagnosis not present

## 2020-04-19 DIAGNOSIS — R519 Headache, unspecified: Secondary | ICD-10-CM

## 2020-04-19 DIAGNOSIS — M50121 Cervical disc disorder at C4-C5 level with radiculopathy: Secondary | ICD-10-CM

## 2020-04-19 DIAGNOSIS — R531 Weakness: Secondary | ICD-10-CM

## 2020-04-19 NOTE — Progress Notes (Signed)
Subjective:    Patient ID: Tina Chapman, female    DOB: 09-Jul-1966, 54 y.o.   MRN: [id number]  Chief Complaint  Patient presents with  . Dizziness    x 2 weeks   . Arm Pain    Patient states that she has been having ongoing left arm pain.   PT presents to the office today with dizziness that started two weeks ago that comes and goes. She reports she has chronic lower and upper back pain that radiates down her left leg and bilateral arms. She has seen Ortho for this and has had injections without relief. She has appointment [facility] tomorrow.   She had stable CBC on 02/11/20, stable CMP 02/20/20. Her TSH was abnormal on 12/03/19 at 10.0.  She is complaining of headache in the back of her head, but states she has this come and goes.  Dizziness This is a new problem. The current episode started 1 to 4 weeks ago. The problem occurs intermittently. The problem has been waxing and waning. Associated symptoms include fatigue.  Arm Pain   Back Pain This is a chronic problem. The current episode started more than 1 year ago. The problem occurs constantly. The pain is present in the lumbar spine and thoracic spine. The quality of the pain is described as aching and cramping. The pain radiates to the left thigh. The pain is at a severity of 8/10. The pain is moderate.      Review of Systems  Constitutional: Positive for fatigue.  Musculoskeletal: Positive for back pain.  Neurological: Positive for dizziness.  All other systems reviewed and are negative.      Objective:   Physical Exam Vitals reviewed.  Constitutional:      General: She is not in acute distress.    Appearance: She is well-developed. She is ill-appearing and diaphoretic.  HENT:     Head: Normocephalic and atraumatic.  Eyes:     Pupils: Pupils are equal, round, and reactive to light.  Neck:     Thyroid: No thyromegaly.  Cardiovascular:     Rate and Rhythm: Normal rate and regular rhythm.     Heart  sounds: Normal heart sounds. No murmur.  Pulmonary:     Effort: Pulmonary effort is normal. No respiratory distress.     Breath sounds: Normal breath sounds. No wheezing.  Abdominal:     General: Bowel sounds are normal. There is no distension.     Palpations: Abdomen is soft.     Tenderness: There is no abdominal tenderness.  Musculoskeletal:        General: No tenderness. Normal range of motion.     Cervical back: Normal range of motion and neck supple.  Skin:    General: Skin is warm.  Neurological:     Mental Status: She is alert and oriented to person, place, and time.     Cranial Nerves: No cranial nerve deficit.     Motor: Weakness (left sided weakness) present.     Deep Tendon Reflexes: Reflexes are normal and symmetric.  Psychiatric:        Behavior: Behavior normal.        Thought Content: Thought content normal.        Judgment: Judgment normal.       BP 136/85   Pulse 92   Temp (!) 96.7 F (35.9 C) (Temporal)   Ht '5\' 4"'  (1.626 m)   Wt 220 lb (99.8 kg)   SpO2 95%   BMI 37.76  kg/m      Assessment & Plan:  Tina Chapman comes in today with chief complaint of Dizziness (x 2 weeks ) and Arm Pain (Patient states that she has been having ongoing left arm pain.)   Diagnosis and orders addressed:  1. Dizziness - CMP14+EGFR - CT ANGIO HEAD W OR WO CONTRAST; Future  2. Chronic bilateral low back pain with left-sided sciatica - CMP14+EGFR  3. Hypothyroidism (acquired) - CMP14+EGFR - TSH  4. 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 Left-sided weakness - CMP14+EGFR - CT ANGIO HEAD W OR WO CONTRAST; Future  6. Nonintractable headache, unspecified chronicity pattern, unspecified headache type - CMP14+EGFR - CT ANGIO HEAD W OR WO CONTRAST; Future   Health Maintenance reviewed Diet and exercise encouraged  Follow up plan: Keep follow up specialists and PCP   Evelina Dun, FNP

## 2020-04-19 NOTE — Patient Instructions (Signed)
Dizziness Dizziness is a common problem. It is a feeling of unsteadiness or light-headedness. You may feel like you are about to faint. Dizziness can lead to injury if you stumble or fall. Anyone can become dizzy, but dizziness is more common in older adults. This condition can be caused by a number of things, including medicines, dehydration, or illness. Follow these instructions at home: Eating and drinking  Drink enough fluid to keep your urine clear or pale yellow. This helps to keep you from becoming dehydrated. Try to drink more clear fluids, such as water.  Do not drink alcohol.  Limit your caffeine intake if told to do so by your health care provider. Check ingredients and nutrition facts to see if a food or beverage contains caffeine.  Limit your salt (sodium) intake if told to do so by your health care provider. Check ingredients and nutrition facts to see if a food or beverage contains sodium. Activity  Avoid making quick movements. ? Rise slowly from chairs and steady yourself until you feel okay. ? In the morning, first sit up on the side of the bed. When you feel okay, stand slowly while you hold onto something until you know that your balance is fine.  If you need to stand in one place for a long time, move your legs often. Tighten and relax the muscles in your legs while you are standing.  Do not drive or use heavy machinery if you feel dizzy.  Avoid bending down if you feel dizzy. Place items in your home so that they are easy for you to reach without leaning over. Lifestyle  Do not use any products that contain nicotine or tobacco, such as cigarettes and e-cigarettes. If you need help quitting, ask your health care provider.  Try to reduce your stress level by using methods such as yoga or meditation. Talk with your health care provider if you need help to manage your stress. General instructions  Watch your dizziness for any changes.  Take over-the-counter and  prescription medicines only as told by your health care provider. Talk with your health care provider if you think that your dizziness is caused by a medicine that you are taking.  Tell a friend or a family member that you are feeling dizzy. If he or she notices any changes in your behavior, have this person call your health care provider.  Keep all follow-up visits as told by your health care provider. This is important. Contact a health care provider if:  Your dizziness does not go away.  Your dizziness or light-headedness gets worse.  You feel nauseous.  You have reduced hearing.  You have new symptoms.  You are unsteady on your feet or you feel like the room is spinning. Get help right away if:  You vomit or have diarrhea and are unable to eat or drink anything.  You have problems talking, walking, swallowing, or using your arms, hands, or legs.  You feel generally weak.  You are not thinking clearly or you have trouble forming sentences. It may take a friend or family member to notice this.  You have chest pain, abdominal pain, shortness of breath, or sweating.  Your vision changes.  You have any bleeding.  You have a severe headache.  You have neck pain or a stiff neck.  You have a fever. These symptoms may represent a serious problem that is an emergency. Do not wait to see if the symptoms will go away. Get medical help   right away. Call your local emergency services (911 in the [place] Do not drive yourself to the hospital. Summary  Dizziness is a feeling of unsteadiness or light-headedness. This condition can be caused by a number of things, including medicines, dehydration, or illness.  Anyone can become dizzy, but dizziness is more common in older adults.  Drink enough fluid to keep your urine clear or pale yellow. Do not drink alcohol.  Avoid making quick movements if you feel dizzy. Monitor your dizziness for any changes. This information is not intended to  replace advice given to you by your health care provider. Make sure you discuss any questions you have with your health care provider. Document Revised: 11/02/2017 Document Reviewed: 12/02/2016 Elsevier Patient Education  2020 [employer]  

## 2020-04-20 ENCOUNTER — Other Ambulatory Visit: Payer: Self-pay | Admitting: Family

## 2020-04-20 ENCOUNTER — Other Ambulatory Visit (HOSPITAL_COMMUNITY): Payer: Self-pay

## 2020-04-20 LAB — CMP14+EGFR
ALT: 29 IU/L (ref 0–32)
AST: 23 IU/L (ref 0–40)
Albumin/Globulin Ratio: 1.8 (ref 1.2–2.2)
Albumin: 4.7 g/dL (ref 3.8–4.9)
Alkaline Phosphatase: 117 IU/L (ref 48–121)
BUN/Creatinine Ratio: 28 — ABNORMAL HIGH (ref 9–23)
BUN: 15 mg/dL (ref 6–24)
Bilirubin Total: 1 mg/dL (ref 0.0–1.2)
CO2: 23 mmol/L (ref 20–29)
Calcium: 10.3 mg/dL — ABNORMAL HIGH (ref 8.7–10.2)
Chloride: 98 mmol/L (ref 96–106)
Creatinine, Ser: 0.53 mg/dL — ABNORMAL LOW (ref 0.57–1.00)
GFR calc Af Amer: 125 mL/min/{1.73_m2} (ref >59)
GFR calc non Af Amer: 109 mL/min/{1.73_m2} (ref >59)
Globulin, Total: 2.6 g/dL (ref 1.5–4.5)
Glucose: 99 mg/dL (ref 65–99)
Potassium: 4.9 mmol/L (ref 3.5–5.2)
Sodium: 136 mmol/L (ref 134–144)
Total Protein: 7.3 g/dL (ref 6.0–8.5)

## 2020-04-20 LAB — TSH: TSH: 6 u[IU]/mL — ABNORMAL HIGH (ref 0.450–4.500)

## 2020-04-20 MED ORDER — LEVOTHYROXINE SODIUM 150 MCG PO TABS
150.0000 ug | ORAL_TABLET | Freq: Every day | ORAL | 11 refills | Status: DC
Start: 2020-04-20 — End: 2020-10-25

## 2020-04-21 ENCOUNTER — Other Ambulatory Visit: Payer: Self-pay

## 2020-04-21 ENCOUNTER — Ambulatory Visit (HOSPITAL_COMMUNITY)
Admission: RE | Admit: 2020-04-21 | Discharge: 2020-04-21 | Disposition: A | Discharge status: Home / Self Care | Payer: 59 | Source: Ambulatory Visit | Attending: Family | Admitting: Family

## 2020-04-21 DIAGNOSIS — R531 Weakness: Secondary | ICD-10-CM | POA: Insufficient documentation

## 2020-04-21 DIAGNOSIS — R42 Dizziness and giddiness: Secondary | ICD-10-CM | POA: Insufficient documentation

## 2020-04-21 DIAGNOSIS — R519 Headache, unspecified: Secondary | ICD-10-CM | POA: Diagnosis present

## 2020-04-21 MED ORDER — IOHEXOL 350 MG/ML SOLN
100.0000 mL | Freq: Once | INTRAVENOUS | Status: AC | PRN
  Administered 2020-04-21: 75 mL via INTRAVENOUS

## 2020-04-22 ENCOUNTER — Encounter: Payer: Self-pay | Admitting: Family Medicine

## 2020-04-22 NOTE — Telephone Encounter (Signed)
Pt called regarding this My Chart message and stated that she has been taking Metformin 500mg  2x's a day because that's what the pharmacy put on her bottle. Per pt's medication list, pt is only supposed to be taking Metformin 1x/day. Wants to make Dr Dettinger aware of this.

## 2020-05-24 ENCOUNTER — Other Ambulatory Visit: Payer: Self-pay | Admitting: Family Medicine

## 2020-05-29 ENCOUNTER — Other Ambulatory Visit: Payer: Self-pay | Admitting: Family Medicine

## 2020-06-02 ENCOUNTER — Telehealth: Payer: Self-pay | Admitting: Family Medicine

## 2020-06-02 MED ORDER — METFORMIN HCL 500 MG PO TABS: 500.0000 mg | ORAL_TABLET | Freq: Two times a day (BID) | ORAL | 1 refills | Status: DC

## 2020-06-02 NOTE — Telephone Encounter (Signed)
I know no I have with the prescription for the Metformin but I do think she should be taking 2/day, 1 in the morning 1 in the evening.  Please have her take the 150 thyroid and we will recheck that at her next visit.  A Pap smear can still be recommended for people will have a partial hysterectomy although not required, typically if they want to do it every 5 years at that point.  It is not required but is still recommended to at least get an exam.

## 2020-06-02 NOTE — Telephone Encounter (Signed)
Patient aware and verbalized understanding. Patient wanted to make appt for pap appt made. Resent metformin in for 2 a day

## 2020-06-02 NOTE — Telephone Encounter (Signed)
Patient aware we were calling to see what dosage of thyroid was suppose to be 150. Is she only suppose to take 1 metformin or 2 because she was taking 2 now her bottle says take one. Also do you still recommend a pap for patient.

## 2020-06-08 ENCOUNTER — Encounter: Payer: Self-pay | Admitting: Family Medicine

## 2020-06-16 MED ORDER — DULOXETINE HCL 30 MG PO CPEP
30.0000 mg | ORAL_CAPSULE | Freq: Every day | ORAL | 1 refills | Status: DC
Start: 2020-06-16 — End: 2020-07-21

## 2020-06-29 ENCOUNTER — Telehealth: Payer: Self-pay | Admitting: Family Medicine

## 2020-06-29 ENCOUNTER — Encounter: Payer: Self-pay | Admitting: Family Medicine

## 2020-06-29 DIAGNOSIS — J0120 Acute ethmoidal sinusitis, unspecified: Secondary | ICD-10-CM

## 2020-06-29 DIAGNOSIS — U071 COVID-19: Secondary | ICD-10-CM

## 2020-06-29 MED ORDER — FLUTICASONE PROPIONATE 50 MCG/ACT NA SUSP: 1.0000 | Freq: Two times a day (BID) | NASAL | 6 refills | Status: DC | PRN

## 2020-06-29 MED ORDER — AZITHROMYCIN 250 MG PO TABS: ORAL_TABLET | ORAL | 0 refills | Status: DC

## 2020-06-29 NOTE — Telephone Encounter (Signed)
Patient aware to isolate herself and quarantine.  Advised to wear Mask while taking care of handicap son.  Patient would like to have medication sent to pharmacy to help treat symptoms

## 2020-06-29 NOTE — Addendum Note (Signed)
Addended by: Arville Care on: 06/29/2020 01:43 PM   Modules accepted: Orders

## 2020-06-29 NOTE — Telephone Encounter (Signed)
I sent Flonase and azithromycin for her, she can also use Mucinex to help with symptoms.

## 2020-07-01 ENCOUNTER — Encounter: Payer: Self-pay | Admitting: Family Medicine

## 2020-07-01 DIAGNOSIS — J0120 Acute ethmoidal sinusitis, unspecified: Secondary | ICD-10-CM

## 2020-07-05 MED ORDER — FLUTICASONE PROPIONATE 50 MCG/ACT NA SUSP: 1.0000 | Freq: Two times a day (BID) | NASAL | 6 refills | Status: DC | PRN

## 2020-07-05 MED ORDER — PREDNISONE 20 MG PO TABS: ORAL_TABLET | ORAL | 0 refills | Status: DC

## 2020-07-05 MED ORDER — AZITHROMYCIN 250 MG PO TABS
ORAL_TABLET | ORAL | 0 refills | Status: DC
Start: 2020-07-05 — End: 2020-10-25

## 2020-07-12 ENCOUNTER — Encounter: Payer: 59 | Admitting: Family Medicine

## 2020-07-13 ENCOUNTER — Other Ambulatory Visit: Payer: Self-pay | Admitting: Physical Medicine and Rehabilitation

## 2020-07-13 DIAGNOSIS — M47812 Spondylosis without myelopathy or radiculopathy, cervical region: Secondary | ICD-10-CM

## 2020-07-20 ENCOUNTER — Encounter: Payer: Self-pay | Admitting: Family Medicine

## 2020-07-21 MED ORDER — DULOXETINE HCL 60 MG PO CPEP: 60.0000 mg | ORAL_CAPSULE | Freq: Every day | ORAL | 3 refills | Status: DC

## 2020-08-02 ENCOUNTER — Ambulatory Visit
Admission: RE | Admit: 2020-08-02 | Discharge: 2020-08-02 | Disposition: A | Discharge status: Home / Self Care | Payer: 59 | Source: Ambulatory Visit | Attending: Physical Medicine and Rehabilitation | Admitting: Physical Medicine and Rehabilitation

## 2020-08-02 ENCOUNTER — Other Ambulatory Visit: Payer: Self-pay

## 2020-08-02 DIAGNOSIS — M47812 Spondylosis without myelopathy or radiculopathy, cervical region: Secondary | ICD-10-CM

## 2020-08-15 ENCOUNTER — Encounter: Payer: Self-pay | Admitting: Family Medicine

## 2020-09-03 ENCOUNTER — Encounter: Payer: Self-pay | Admitting: Family Medicine

## 2020-09-24 ENCOUNTER — Other Ambulatory Visit: Payer: Self-pay | Admitting: Family

## 2020-09-24 ENCOUNTER — Encounter: Payer: Self-pay | Admitting: Family Medicine

## 2020-10-15 ENCOUNTER — Encounter: Payer: 59 | Admitting: Family Medicine

## 2020-10-15 ENCOUNTER — Ambulatory Visit: Payer: 59 | Admitting: Family Medicine

## 2020-10-21 ENCOUNTER — Encounter: Payer: Self-pay | Admitting: Family Medicine

## 2020-10-25 ENCOUNTER — Other Ambulatory Visit: Payer: Self-pay

## 2020-10-25 ENCOUNTER — Encounter: Payer: Self-pay | Admitting: Family Medicine

## 2020-10-25 ENCOUNTER — Ambulatory Visit (INDEPENDENT_AMBULATORY_CARE_PROVIDER_SITE_OTHER): Payer: 59 | Admitting: Family Medicine

## 2020-10-25 ENCOUNTER — Other Ambulatory Visit (HOSPITAL_COMMUNITY)
Admission: RE | Admit: 2020-10-25 | Discharge: 2020-10-25 | Disposition: A | Discharge status: Home / Self Care | Payer: 59 | Source: Ambulatory Visit | Attending: Family Medicine | Admitting: Family Medicine

## 2020-10-25 VITALS — BP 134/85 | HR 89 | Ht 64.0 in | Wt 218.0 lb

## 2020-10-25 DIAGNOSIS — M48061 Spinal stenosis, lumbar region without neurogenic claudication: Secondary | ICD-10-CM

## 2020-10-25 DIAGNOSIS — Z1159 Encounter for screening for other viral diseases: Secondary | ICD-10-CM

## 2020-10-25 DIAGNOSIS — I10 Essential (primary) hypertension: Secondary | ICD-10-CM | POA: Diagnosis not present

## 2020-10-25 DIAGNOSIS — E118 Type 2 diabetes mellitus with unspecified complications: Secondary | ICD-10-CM | POA: Diagnosis not present

## 2020-10-25 DIAGNOSIS — Z1231 Encounter for screening mammogram for malignant neoplasm of breast: Secondary | ICD-10-CM

## 2020-10-25 DIAGNOSIS — Z23 Encounter for immunization: Secondary | ICD-10-CM | POA: Diagnosis not present

## 2020-10-25 DIAGNOSIS — Z01411 Encounter for gynecological examination (general) (routine) with abnormal findings: Secondary | ICD-10-CM | POA: Diagnosis not present

## 2020-10-25 DIAGNOSIS — G629 Polyneuropathy, unspecified: Secondary | ICD-10-CM

## 2020-10-25 DIAGNOSIS — E039 Hypothyroidism, unspecified: Secondary | ICD-10-CM

## 2020-10-25 DIAGNOSIS — Z794 Long term (current) use of insulin: Secondary | ICD-10-CM

## 2020-10-25 DIAGNOSIS — Z01419 Encounter for gynecological examination (general) (routine) without abnormal findings: Secondary | ICD-10-CM

## 2020-10-25 DIAGNOSIS — E119 Type 2 diabetes mellitus without complications: Secondary | ICD-10-CM

## 2020-10-25 DIAGNOSIS — Z1211 Encounter for screening for malignant neoplasm of colon: Secondary | ICD-10-CM

## 2020-10-25 DIAGNOSIS — F339 Major depressive disorder, recurrent, unspecified: Secondary | ICD-10-CM

## 2020-10-25 LAB — BAYER DCA HB A1C WAIVED: HB A1C (BAYER DCA - WAIVED): 5.7 % (ref <7.0)

## 2020-10-25 MED ORDER — PANTOPRAZOLE SODIUM 40 MG PO TBEC: 40.0000 mg | DELAYED_RELEASE_TABLET | Freq: Every day | ORAL | 3 refills | Status: DC

## 2020-10-25 MED ORDER — LEVOTHYROXINE SODIUM 150 MCG PO TABS: 150.0000 ug | ORAL_TABLET | Freq: Every day | ORAL | 3 refills | Status: DC

## 2020-10-25 MED ORDER — INSULIN GLARGINE 100 UNIT/ML ~~LOC~~ SOLN: 20.0000 [IU] | Freq: Every day | SUBCUTANEOUS | 11 refills | Status: DC

## 2020-10-25 MED ORDER — METFORMIN HCL 500 MG PO TABS: 500.0000 mg | ORAL_TABLET | Freq: Two times a day (BID) | ORAL | 3 refills | Status: DC

## 2020-10-25 MED ORDER — INSULIN ASPART 100 UNIT/ML ~~LOC~~ SOLN: 10.0000 [IU] | Freq: Three times a day (TID) | SUBCUTANEOUS | 11 refills | Status: DC

## 2020-10-25 MED ORDER — GABAPENTIN 100 MG PO CAPS: 100.0000 mg | ORAL_CAPSULE | Freq: Three times a day (TID) | ORAL | 3 refills | Status: DC

## 2020-10-25 MED ORDER — BACLOFEN 10 MG PO TABS: 10.0000 mg | ORAL_TABLET | Freq: Three times a day (TID) | ORAL | 3 refills | Status: DC

## 2020-10-25 MED ORDER — LISINOPRIL 20 MG PO TABS: 20.0000 mg | ORAL_TABLET | Freq: Every day | ORAL | 3 refills | Status: DC

## 2020-10-25 MED ORDER — FREESTYLE LIBRE 14 DAY READER DEVI: 1.0000 | Freq: Four times a day (QID) | 1 refills | Status: DC

## 2020-10-25 MED ORDER — DULOXETINE HCL 60 MG PO CPEP: 120.0000 mg | ORAL_CAPSULE | Freq: Every day | ORAL | 3 refills | Status: DC

## 2020-10-25 MED ORDER — FREESTYLE LIBRE 14 DAY SENSOR MISC: 1.0000 | 3 refills | Status: DC

## 2020-10-25 NOTE — Patient Instructions (Signed)
Lot [id number]

## 2020-10-25 NOTE — Addendum Note (Signed)
Addended by: Dorene Sorrow on: 10/25/2020 04:39 PM   Modules accepted: Orders

## 2020-10-25 NOTE — Progress Notes (Addendum)
BP 134/85   Pulse 89   Ht '5\' 4"'  (1.626 m)   Wt 218 lb (98.9 kg)   SpO2 98%   BMI 37.42 kg/m    Subjective:   Patient ID: Tina Chapman, female    DOB: June 13, 1966, 54 y.o.   MRN: [id number]  HPI: Tina Chapman is a 54 y.o. female presenting on 10/25/2020 for Medical Management of Chronic Issues, Diabetes, and Depression   HPI Well woman exam and physical Patient is coming in today for well woman exam and physical and Pap smear.  She has a history of a hysterectomy about 10 years ago.  Patient does fight chronic back pain and sees a specialist for this and is try to get on disability.  Type 2 diabetes mellitus Patient comes in today for recheck of his diabetes. Patient has been currently taking NovoLog 8 units 3 times daily as needed and Lantus 20 units nightly and Metformin, A1c is 5.7 which is good but concerned that she might be going too low.. Patient is currently on an ACE inhibitor/ARB. Patient has seen an ophthalmologist this year. Patient denies any issues with their feet. The symptom started onset as an adult hypothyroidism and morbid obesity and chronic back pains ARE RELATED TO DM   Hypothyroidism recheck Patient is coming in for thyroid recheck today as well. They deny any issues with hair changes or heat or cold problems or diarrhea or constipation. They deny any chest pain or palpitations. They are currently on levothyroxine 150 micrograms   Depression Patient says is been a lot more depressed because of her pain issues.  Patient says that her depression has not been doing good and a lot of it stems from her not being able to work and her son having to do a lot to pay for the house and then she has gone to the food bank because they are short on food. Patient admits to feelings of wanting to be better off dead but denies any actual suicidal plans Depression screen [facility] 2/9 10/25/2020 04/19/2020 02/11/2020 12/03/2019 04/30/2019  Decreased Interest 3 0 3 3 0  Down,  Depressed, Hopeless 3 0 3 3 0  PHQ - 2 Score 6 0 6 6 0  Altered sleeping 3 - 3 3 -  Tired, decreased energy 3 - 3 3 -  Change in appetite 3 - 3 3 -  Feeling bad or failure about yourself  3 - 3 3 -  Trouble concentrating 3 - 3 3 -  Moving slowly or fidgety/restless 0 - 0 3 -  Suicidal thoughts 1 - 3 1 -  PHQ-9 Score 22 - 24 25 -  Difficult doing work/chores - - Extremely dIfficult Extremely dIfficult -      Relevant past medical, surgical, family and social history reviewed and updated as indicated. Interim medical history since our last visit reviewed. Allergies and medications reviewed and updated.  Review of Systems  Constitutional: Negative for chills and fever.  Eyes: Negative for visual disturbance.  Respiratory: Negative for chest tightness and shortness of breath.   Cardiovascular: Negative for chest pain and leg swelling.  Musculoskeletal: Positive for arthralgias, back pain, myalgias and neck pain. Negative for gait problem.  Skin: Negative for rash.  Neurological: Negative for light-headedness and headaches.  Psychiatric/Behavioral: Positive for dysphoric mood and suicidal ideas. Negative for agitation, behavioral problems, self-injury and sleep disturbance. The patient is nervous/anxious.   All other systems reviewed and are negative.   Per HPI unless specifically indicated  above   Allergies as of 10/25/2020   No Known Allergies     Medication List       Accurate as of October 25, 2020 10:10 AM. If you have any questions, ask your nurse or doctor.        STOP taking these medications   azithromycin 250 MG tablet Commonly known as: ZITHROMAX Stopped by: Fransisca Kaufmann Dae Antonucci, MD   predniSONE 20 MG tablet Commonly known as: DELTASONE Stopped by: Fransisca Kaufmann An Schnabel, MD     TAKE these medications   DULoxetine 60 MG capsule Commonly known as: Cymbalta Take 1 capsule (60 mg total) by mouth daily.   fluticasone 50 MCG/ACT nasal spray Commonly known as:  FLONASE Place 1 spray into both nostrils 2 (two) times daily as needed for allergies or rhinitis.   gabapentin 100 MG capsule Commonly known as: NEURONTIN Take 1 capsule (100 mg total) by mouth 3 (three) times daily.   Global Inject Ease Lancets 30G Misc 1 each by Does not apply route 4 (four) times daily.   glucose blood test strip 1 each by Other route 4 (four) times daily. True metrix   insulin aspart 100 UNIT/ML injection Commonly known as: NovoLOG Inject 10 Units into the skin 3 (three) times daily before meals.   insulin glargine 100 UNIT/ML injection Commonly known as: LANTUS Inject 0.2 mLs (20 Units total) into the skin daily. Daily at bedtime   Insulin Syringe-Needle U-100 31G X 5/16" 1 ML Misc Commonly known as: Global Inject Ease Insulin Syr 1 each by Does not apply route 4 (four) times daily.   levothyroxine 150 MCG tablet Commonly known as: Synthroid Take 1 tablet (150 mcg total) by mouth daily.   lisinopril 20 MG tablet Commonly known as: ZESTRIL Take 1 tablet (20 mg total) by mouth daily.   metFORMIN 500 MG tablet Commonly known as: GLUCOPHAGE Take 1 tablet (500 mg total) by mouth 2 (two) times daily with a meal. (Needs to be seen before next refill) What changed: Another medication with the same name was removed. Continue taking this medication, and follow the directions you see here. Changed by: Fransisca Kaufmann Reice Bienvenue, MD   methocarbamol 500 MG tablet Commonly known as: ROBAXIN Take 500 mg by mouth 3 (three) times daily.   pantoprazole 40 MG tablet Commonly known as: PROTONIX Take 1 tablet (40 mg total) by mouth daily.   polyethylene glycol powder 17 GM/SCOOP powder Commonly known as: GLYCOLAX/MIRALAX Take 17 g by mouth 2 (two) times daily as needed.        Objective:   BP 134/85   Pulse 89   Ht '5\' 4"'  (1.626 m)   Wt 218 lb (98.9 kg)   SpO2 98%   BMI 37.42 kg/m   Wt Readings from Last 3 Encounters:  10/25/20 218 lb (98.9 kg)  04/19/20 220  lb (99.8 kg)  02/11/20 222 lb (100.7 kg)    Physical Exam Vitals reviewed.  Constitutional:      General: She is not in acute distress.    Appearance: She is well-developed and well-nourished. She is not diaphoretic.  Eyes:     Extraocular Movements: EOM normal.     Conjunctiva/sclera: Conjunctivae normal.  Neck:     Thyroid: No thyromegaly.  Cardiovascular:     Rate and Rhythm: Normal rate and regular rhythm.     Pulses: Intact distal pulses.     Heart sounds: Normal heart sounds. No murmur heard.   Pulmonary:     Effort:  Pulmonary effort is normal. No respiratory distress.     Breath sounds: Normal breath sounds. No wheezing.  Chest:  Breasts: No discharge from either breast. No tenderness and bleeding. Breasts are symmetrical.     Right: No inverted nipple, mass, nipple discharge, skin change or tenderness.     Left: No inverted nipple, mass, nipple discharge, skin change or tenderness.      Comments: Very large pendulous breast tissue, no masses Abdominal:     General: Bowel sounds are normal. There is no distension.     Palpations: Abdomen is soft.     Tenderness: There is no abdominal tenderness. There is no guarding or rebound.  Genitourinary:    Exam position: Supine.     Labia:        Right: No rash or lesion.        Left: No rash or lesion.      Vagina: Vaginal discharge and tenderness present.     Uterus: Normal.      Adnexa:        Right: No mass or tenderness.         Left: No mass or tenderness.       Comments: Blind vaginal pouch with irritation and a small amount of discharge, will send off with Pap to see if discharge is related to yeast irritation, if not may consider treatment for vaginal atrophy Musculoskeletal:        General: No edema. Normal range of motion.     Cervical back: Neck supple.  Lymphadenopathy:     Cervical: No cervical adenopathy.     Upper Body:  No axillary adenopathy present. Skin:    General: Skin is warm and dry.      Findings: No rash.  Neurological:     Mental Status: She is alert and oriented to person, place, and time.     Coordination: Coordination normal.  Psychiatric:        Mood and Affect: Mood and affect normal.        Behavior: Behavior normal.       Assessment & Plan:   Problem List Items Addressed This Visit      Cardiovascular and Mediastinum   Hypertension   Relevant Medications   lisinopril (ZESTRIL) 20 MG tablet     Endocrine   Hypothyroidism (acquired)   Relevant Medications   levothyroxine (SYNTHROID) 150 MCG tablet   Other Relevant Orders   TSH   Diabetes mellitus, type II, insulin dependent (HCC)   Relevant Medications   metFORMIN (GLUCOPHAGE) 500 MG tablet   lisinopril (ZESTRIL) 20 MG tablet   insulin glargine (LANTUS) 100 UNIT/ML injection   insulin aspart (NOVOLOG) 100 UNIT/ML injection   Continuous Blood Gluc Sensor (FREESTYLE LIBRE 14 DAY SENSOR) MISC   Continuous Blood Gluc Receiver (FREESTYLE LIBRE 14 DAY READER) DEVI     Other   Depression, recurrent (HCC)   Relevant Medications   DULoxetine (CYMBALTA) 60 MG capsule    Other Visit Diagnoses    Well woman exam with routine gynecological exam    -  Primary   Relevant Orders   Cytology - PAP([facility])   Essential hypertension       Relevant Medications   lisinopril (ZESTRIL) 20 MG tablet   Other Relevant Orders   CBC with Differential/Platelet   CMP14+EGFR   Lipid panel   Bayer DCA Hb A1c Waived   Controlled type 2 diabetes mellitus with complication, with long-term current use of insulin (HCC)  Relevant Medications   metFORMIN (GLUCOPHAGE) 500 MG tablet   lisinopril (ZESTRIL) 20 MG tablet   insulin glargine (LANTUS) 100 UNIT/ML injection   insulin aspart (NOVOLOG) 100 UNIT/ML injection   Other Relevant Orders   CBC with Differential/Platelet   CMP14+EGFR   Lipid panel   Bayer DCA Hb A1c Waived   Need for hepatitis C screening test       Relevant Orders   Hepatitis C antibody    Foraminal stenosis of lumbar region       Relevant Medications   gabapentin (NEURONTIN) 100 MG capsule   Neuropathy       Relevant Medications   gabapentin (NEURONTIN) 100 MG capsule   Encounter for screening mammogram for malignant neoplasm of breast       Relevant Orders   MM 3D SCREEN BREAST BILATERAL   Colon cancer screening       Relevant Orders   Ambulatory referral to Gastroenterology      This office visit was a visit to discuss patient's diabetic management and because she is out of control and using insulin 4 times daily and having to check her blood sugars 4 times daily I believe she would be a good candidate for a continuous subcutaneous glucose monitor such as freestyle libre.   Will change muscle relaxer to see if it helps more with the back, she is still working with a spinal doctor and trying to get injections.  We will schedule for breast mammogram exam  Continue gabapentin  Patient is having difficulty getting sufficient food further family, will refer to lot 25:40, has already gone to other food kitchens  Follow up plan: Return in about 3 months (around 01/23/2021), or if symptoms worsen or fail to improve, for depression and anixety.  Counseling provided for all of the vaccine components Orders Placed This Encounter  Procedures  . CBC with Differential/Platelet  . CMP14+EGFR  . Lipid panel  . TSH  . Bayer [facility] Hb A1c [place], MD [facility] 10/25/2020, 10:10 AM

## 2020-10-26 LAB — CMP14+EGFR
ALT: 48 IU/L — ABNORMAL HIGH (ref 0–32)
AST: 32 IU/L (ref 0–40)
Albumin/Globulin Ratio: 1.6 (ref 1.2–2.2)
Albumin: 4.5 g/dL (ref 3.8–4.9)
Alkaline Phosphatase: 125 IU/L — ABNORMAL HIGH (ref 44–121)
BUN/Creatinine Ratio: 17 (ref 9–23)
BUN: 12 mg/dL (ref 6–24)
Bilirubin Total: 1.1 mg/dL (ref 0.0–1.2)
CO2: 25 mmol/L (ref 20–29)
Calcium: 10.2 mg/dL (ref 8.7–10.2)
Chloride: 102 mmol/L (ref 96–106)
Creatinine, Ser: 0.69 mg/dL (ref 0.57–1.00)
GFR calc Af Amer: 115 mL/min/{1.73_m2} (ref >59)
GFR calc non Af Amer: 100 mL/min/{1.73_m2} (ref >59)
Globulin, Total: 2.8 g/dL (ref 1.5–4.5)
Glucose: 113 mg/dL — ABNORMAL HIGH (ref 65–99)
Potassium: 4.1 mmol/L (ref 3.5–5.2)
Sodium: 143 mmol/L (ref 134–144)
Total Protein: 7.3 g/dL (ref 6.0–8.5)

## 2020-10-26 LAB — CBC WITH DIFFERENTIAL/PLATELET
Basophils Absolute: 0.1 10*3/uL (ref 0.0–0.2)
Basos: 1 %
EOS (ABSOLUTE): 0.5 10*3/uL — ABNORMAL HIGH (ref 0.0–0.4)
Eos: 8 %
Hematocrit: 44.2 % (ref 34.0–46.6)
Hemoglobin: 14.7 g/dL (ref 11.1–15.9)
Immature Grans (Abs): 0 10*3/uL (ref 0.0–0.1)
Immature Granulocytes: 0 %
Lymphocytes Absolute: 2.3 10*3/uL (ref 0.7–3.1)
Lymphs: 32 %
MCH: 29.3 pg (ref 26.6–33.0)
MCHC: 33.3 g/dL (ref 31.5–35.7)
MCV: 88 fL (ref 79–97)
Monocytes Absolute: 0.5 10*3/uL (ref 0.1–0.9)
Monocytes: 7 %
Neutrophils Absolute: 3.8 10*3/uL (ref 1.4–7.0)
Neutrophils: 52 %
Platelets: 313 10*3/uL (ref 150–450)
RBC: 5.02 x10E6/uL (ref 3.77–5.28)
RDW: 12.4 % (ref 11.7–15.4)
WBC: 7.1 10*3/uL (ref 3.4–10.8)

## 2020-10-26 LAB — LIPID PANEL
Chol/HDL Ratio: 4.6 ratio — ABNORMAL HIGH (ref 0.0–4.4)
Cholesterol, Total: 210 mg/dL — ABNORMAL HIGH (ref 100–199)
HDL: 46 mg/dL (ref >39)
LDL Chol Calc (NIH): 119 mg/dL — ABNORMAL HIGH (ref 0–99)
Triglycerides: 260 mg/dL — ABNORMAL HIGH (ref 0–149)
VLDL Cholesterol Cal: 45 mg/dL — ABNORMAL HIGH (ref 5–40)

## 2020-10-26 LAB — TSH: TSH: 0.058 u[IU]/mL — ABNORMAL LOW (ref 0.450–4.500)

## 2020-10-26 LAB — HEPATITIS C ANTIBODY: Hep C Virus Ab: 0.2 s/co ratio (ref 0.0–0.9)

## 2020-10-27 LAB — CYTOLOGY - PAP: Diagnosis: NEGATIVE

## 2020-10-29 ENCOUNTER — Other Ambulatory Visit: Payer: Self-pay | Admitting: *Deleted

## 2020-10-29 MED ORDER — LEVOTHYROXINE SODIUM 137 MCG PO TABS: 137.0000 ug | ORAL_TABLET | Freq: Every day | ORAL | 2 refills | Status: DC

## 2020-11-01 ENCOUNTER — Encounter: Payer: Self-pay | Admitting: Internal Medicine

## 2020-11-04 ENCOUNTER — Other Ambulatory Visit: Payer: Self-pay | Admitting: *Deleted

## 2020-11-04 MED ORDER — ROSUVASTATIN CALCIUM 10 MG PO TABS: 10.0000 mg | ORAL_TABLET | Freq: Every day | ORAL | 3 refills | Status: DC

## 2020-11-10 ENCOUNTER — Encounter: Payer: Self-pay | Admitting: Family Medicine

## 2020-11-19 ENCOUNTER — Encounter: Payer: Self-pay | Admitting: Family Medicine

## 2020-12-08 ENCOUNTER — Other Ambulatory Visit: Payer: Self-pay

## 2020-12-08 ENCOUNTER — Ambulatory Visit
Admission: RE | Admit: 2020-12-08 | Discharge: 2020-12-08 | Disposition: A | Discharge status: Home / Self Care | Payer: 59 | Source: Ambulatory Visit | Attending: Family Medicine | Admitting: Family Medicine

## 2020-12-08 DIAGNOSIS — Z1231 Encounter for screening mammogram for malignant neoplasm of breast: Secondary | ICD-10-CM

## 2020-12-09 ENCOUNTER — Other Ambulatory Visit: Payer: Self-pay | Admitting: Family Medicine

## 2020-12-11 ENCOUNTER — Encounter: Payer: Self-pay | Admitting: Gastroenterology

## 2020-12-11 NOTE — Progress Notes (Signed)
Referring Provider: Dettinger, Elige Radon, MD Primary Care Physician:  Dettinger, Elige Radon, MD Primary Gastroenterologist:  Dr. Marletta Lor  Chief Complaint  Patient presents with  . Colonoscopy    Had last done about 10 years ago  . Constipation    2-3 times per week has BM, occas slight rectal bleeding, some straining    HPI:   Tina Chapman is a 55 y.o. female presenting today at the request of Dettinger, Elige Radon, MD for colon cancer screening.  Recommended office visit due to medications.  Last colonoscopy about 10 years ago. Also had EGD at that time. In [place]. States EGD was performed due to having a lot of acid reflux. Doesn't remember results of EGD or colonoscopy.    She has been struggling with constipation for the last couple of months along with intermittent rectal bleeding. She has a bowel movement 2-3 times a week with some straining. Blood is on toilet tissue and a little on stool. Rectal bleeding about twice a week. Some burning in her rectum at times. Used to have hemorrhoids. Taking MiraLAX daily. Hasn't noticed much improvement. Feels bloated. Used to have BMs every day.  Not sure what changed.  Has significant GERD symptoms. Taking Protonix 40 mg daily. Has tried OTC prilosec, nexium, and pepcid. Any foods with tomatoes or cheese will worsen symptoms. Even with avoiding these items, she continues with symptoms daily. Trying to limit spices when cooking. Avoiding fried/fatty foods. No soda. Associated nausea and rare vomiting if significant GERD symptoms. Feels foods get hung in her esophagus. Started about 6 months ago. Daily. With solid foods. Has had to regurgitate items.   No abdominal pain. Some pain prior to BMs that resolves thereafter. No black stools. No NSAIDs.   Weight is up and down.    Past Medical History:  Diagnosis Date  . Back pain   . Diabetes (HCC)   . Hyperlipidemia   . Hypertension   . Hypothyroidism   . Sciatic nerve pain     Past  Surgical History:  Procedure Laterality Date  . ABDOMINAL HYSTERECTOMY    . APPENDECTOMY    . CERVIX SURGERY    . CHOLECYSTECTOMY    . COLONOSCOPY WITH ESOPHAGOGASTRODUODENOSCOPY (EGD)     in [place]  . NECK SURGERY  2002   has metal plate   . TONSILLECTOMY      Current Outpatient Medications  Medication Sig Dispense Refill  . DULoxetine (CYMBALTA) 60 MG capsule Take 2 capsules (120 mg total) by mouth daily. 180 capsule 3  . fluticasone (FLONASE) 50 MCG/ACT nasal spray Place 1 spray into both nostrils 2 (two) times daily as needed for allergies or rhinitis. 16 g 6  . gabapentin (NEURONTIN) 300 MG capsule Take 300 mg by mouth 3 (three) times daily.    . hydrocortisone (ANUSOL-HC) 2.5 % rectal cream Place 1 application rectally 2 (two) times daily. For 10-14 days. 30 g 1  . insulin aspart (NOVOLOG) 100 UNIT/ML injection Inject 10 Units into the skin 3 (three) times daily before meals. 10 mL 11  . insulin glargine (LANTUS) 100 UNIT/ML injection Inject 0.2 mLs (20 Units total) into the skin daily. Daily at bedtime 10 mL 11  . levothyroxine (SYNTHROID) 137 MCG tablet Take 1 tablet (137 mcg total) by mouth daily before breakfast. 30 tablet 2  . lisinopril (ZESTRIL) 20 MG tablet Take 1 tablet (20 mg total) by mouth daily. 90 tablet 3  . metFORMIN (GLUCOPHAGE) 500 MG tablet Take 1 tablet (  500 mg total) by mouth 2 (two) times daily with a meal. (Needs to be seen before next refill) 180 tablet 3  . methocarbamol (ROBAXIN) 500 MG tablet Take 500 mg by mouth 3 (three) times daily.    . pantoprazole (PROTONIX) 40 MG tablet Take 1 tablet (40 mg total) by mouth 2 (two) times daily. 60 tablet 3  . polyethylene glycol powder (GLYCOLAX/MIRALAX) 17 GM/SCOOP powder Take 17 g by mouth 2 (two) times daily as needed. (Patient taking differently: Take 17 g by mouth. Once a week) 3350 g 1  . rosuvastatin (CRESTOR) 10 MG tablet Take 1 tablet (10 mg total) by mouth daily. 90 tablet 3  . Continuous Blood Gluc  Receiver (FREESTYLE LIBRE 14 DAY READER) DEVI 1 each by Does not apply route 4 (four) times daily. 1 each 1  . Continuous Blood Gluc Sensor (FREESTYLE LIBRE 14 DAY SENSOR) MISC 1 each by Does not apply route every 14 (fourteen) days. 7 each 3  . Embrace Lancets Ultra Thin 30G MISC Test BS up to four times daily Dx E11.9 100 each 11  . glucose blood test strip Test BS up to four times daily Dx E11.9 100 each 11  . Insulin Syringe-Needle U-100 (GLOBAL INJECT EASE INSULIN SYR) 31G X 5/16" 1 ML MISC 1 each by Does not apply route 4 (four) times daily. 120 each 11   No current facility-administered medications for this visit.    Allergies as of 12/13/2020  . (No Known Allergies)    Family History  Problem Relation Age of Onset  . Early death Mother        38  . Arthritis Mother   . Heart disease Mother   . Arthritis Father   . Cancer Maternal Grandmother        colon, age 75  . Diabetes Paternal Uncle   . Esophageal cancer Neg Hx   . Stomach cancer Neg Hx     Social History   Socioeconomic History  . Marital status: Divorced    Spouse name: Not on file  . Number of children: Not on file  . Years of education: Not on file  . Highest education level: Not on file  Occupational History  . Not on file  Tobacco Use  . Smoking status: Never Smoker  . Smokeless tobacco: Never Used  Vaping Use  . Vaping Use: Never used  Substance and Sexual Activity  . Alcohol use: Never  . Drug use: Never  . Sexual activity: Not Currently  Other Topics Concern  . Not on file  Social History Narrative   74 son 34 years old, here with her   Social Determinants of Health   Financial Resource Strain: Not on file  Food Insecurity: Not on file  Transportation Needs: Not on file  Physical Activity: Not on file  Stress: Not on file  Social Connections: Not on file  Intimate Partner Violence: Not on file    Review of Systems: Gen: Denies any fever, chills, cold or flulike symptoms.  Occasional  lightheadedness, chronic. CV: Occasional chest discomfort with anxiety attack.  No palpitations. Resp: Occasional shortness of breath with anxiety attack.  No cough. GI: See HPI GU : Denies urinary burning, urinary frequency, urinary hesitancy MS: Chronic back and neck pain.  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 Derm: Denies rash Psych: Admits to history of anxiety and depression. Heme: See HPI  Physical Exam: BP (!) 143/90   Pulse 87   Temp (!)  96.9 F (36.1 C)   Ht 5\' 4"  (1.626 m)   Wt 235 lb (106.6 kg)   BMI 40.34 kg/m  General:   Alert and oriented. Pleasant and cooperative. Well-nourished and well-developed.  Head:  Normocephalic and atraumatic. Eyes:  Without icterus, sclera clear and conjunctiva pink.  Ears:  Normal auditory acuity. Lungs:  Clear to auscultation bilaterally. No wheezes, rales, or rhonchi. No distress.  Heart:  S1, S2 present without murmurs appreciated.  Abdomen:  +BS. Abdomen is obese but soft. Mild TTP across upper abdomen. No HSM noted. No guarding or rebound. No masses appreciated.  Rectal:  Deferred  Msk:  Symmetrical without gross deformities. Normal posture. Extremities:  Without edema. Neurologic:  Alert and  oriented x4;  grossly normal neurologically. Skin:  Intact without significant lesions or rashes. Psych: Normal mood and affect.

## 2020-12-13 ENCOUNTER — Ambulatory Visit (INDEPENDENT_AMBULATORY_CARE_PROVIDER_SITE_OTHER): Payer: 59 | Admitting: Gastroenterology

## 2020-12-13 ENCOUNTER — Encounter: Payer: Self-pay | Admitting: Gastroenterology

## 2020-12-13 ENCOUNTER — Other Ambulatory Visit: Payer: Self-pay

## 2020-12-13 ENCOUNTER — Telehealth: Payer: Self-pay

## 2020-12-13 VITALS — BP 143/90 | HR 87 | Temp 96.9°F | Ht 64.0 in | Wt 235.0 lb

## 2020-12-13 DIAGNOSIS — K625 Hemorrhage of anus and rectum: Secondary | ICD-10-CM | POA: Diagnosis not present

## 2020-12-13 DIAGNOSIS — R1310 Dysphagia, unspecified: Secondary | ICD-10-CM | POA: Diagnosis not present

## 2020-12-13 DIAGNOSIS — K219 Gastro-esophageal reflux disease without esophagitis: Secondary | ICD-10-CM

## 2020-12-13 DIAGNOSIS — K5900 Constipation, unspecified: Secondary | ICD-10-CM | POA: Diagnosis not present

## 2020-12-13 LAB — CBC WITH DIFFERENTIAL/PLATELET
Absolute Monocytes: 570 cells/uL (ref 200–950)
Basophils Absolute: 46 cells/uL (ref 0–200)
Basophils Relative: 0.6 %
Eosinophils Absolute: 578 cells/uL — ABNORMAL HIGH (ref 15–500)
Eosinophils Relative: 7.5 %
HCT: 39.6 % (ref 35.0–45.0)
Hemoglobin: 13.7 g/dL (ref 11.7–15.5)
Lymphs Abs: 2433 cells/uL (ref 850–3900)
MCH: 30.2 pg (ref 27.0–33.0)
MCHC: 34.6 g/dL (ref 32.0–36.0)
MCV: 87.4 fL (ref 80.0–100.0)
MPV: 10.6 fL (ref 7.5–12.5)
Monocytes Relative: 7.4 %
Neutro Abs: 4073 cells/uL (ref 1500–7800)
Neutrophils Relative %: 52.9 %
Platelets: 279 10*3/uL (ref 140–400)
RBC: 4.53 10*6/uL (ref 3.80–5.10)
RDW: 11.9 % (ref 11.0–15.0)
Total Lymphocyte: 31.6 %
WBC: 7.7 10*3/uL (ref 3.8–10.8)

## 2020-12-13 MED ORDER — PANTOPRAZOLE SODIUM 40 MG PO TBEC: 40.0000 mg | DELAYED_RELEASE_TABLET | Freq: Two times a day (BID) | ORAL | 3 refills | Status: DC

## 2020-12-13 MED ORDER — HYDROCORTISONE (PERIANAL) 2.5 % EX CREA: 1.0000 "application " | TOPICAL_CREAM | Freq: Two times a day (BID) | CUTANEOUS | 1 refills | Status: DC

## 2020-12-13 NOTE — Patient Instructions (Addendum)
Please have CBC completed at [facility].   We will arrange for you to have an upper endoscopy with possible dilation of your esophagus and colonoscopy in the near future with Dr. Marletta Lor. 1 day prior to procedure: Continue taking mealtime insulin as needed, continue Metformin as needed, take one half dose of Lantus (10 units) at bedtime. Morning of procedure: Do not take any diabetes medications the morning of your procedure.  For constipation: Increase water intake.  Drink enough water to keep your urine pale yellow to clear. Increase fruit and vegetable consumption to maintain adequate fiber intake. Increase MiraLAX to 1 capful in 8 ounces of water in the morning and evening. I would like to try you on Linzess 145 mcg.  We should hopefully be receiving samples today.  We will let you know once our office has samples and you can pick this medication up.  *It is very important that we get your bowels moving well prior to your colonoscopy to ensure that you have a good prep.   For acid reflux: Increase Protonix to 40 mg twice daily 30 minutes before breakfast and dinner.  I have sent a new prescription to your pharmacy. Follow a GERD diet:  Avoid fried, fatty, greasy, spicy, citrus foods. Avoid caffeine and carbonated beverages. Avoid chocolate. Try eating 4-6 small meals a day rather than 3 large meals. Do not eat within 3 hours of laying down. Prop head of bed up on wood or bricks to create a 6 inch incline.  For rectal bleeding: Suspect this may be secondary to hemorrhoids. Try Anusol cream twice daily x10-14 days.  I have sent a prescription to your pharmacy.  We will plan to see you back in the office after your procedures. Please call in 2 week with a progress report on constipation.  It was nice to meet you today!  Tina Memos, PA-C [facility]   Food Choices for Gastroesophageal Reflux Disease, Adult When you have gastroesophageal reflux disease (GERD), the  foods you eat and your eating habits are very important. Choosing the right foods can help ease your discomfort. Think about working with a food expert (dietitian) to help you make good choices. What are tips for following this plan? Reading food labels  Look for foods that are low in saturated fat. Foods that may help with your symptoms include: ? Foods that have less than 5% of daily value (DV) of fat. ? Foods that have 0 grams of trans fat. Cooking  Do not fry your food.  Cook your food by baking, steaming, grilling, or broiling. These are all methods that do not need a lot of fat for cooking.  To add flavor, try to use herbs that are low in spice and acidity. Meal planning  Choose healthy foods that are low in fat, such as: ? Fruits and vegetables. ? Whole grains. ? Low-fat dairy products. ? Lean meats, fish, and poultry.  Eat small meals often instead of eating 3 large meals each day. Eat your meals slowly in a place where you are relaxed. Avoid bending over or lying down until 2-3 hours after eating.  Limit high-fat foods such as fatty meats or fried foods.  Limit your intake of fatty foods, such as oils, butter, and shortening.  Avoid the following as told by your doctor: ? Foods that cause symptoms. These may be different for different people. Keep a food diary to keep track of foods that cause symptoms. ? Alcohol. ? Drinking a lot of  liquid with meals. ? Eating meals during the 2-3 hours before bed.   Lifestyle  Stay at a healthy weight. Ask your doctor what weight is healthy for you. If you need to lose weight, work with your doctor to do so safely.  Exercise for at least 30 minutes on 5 or more days each week, or as told by your doctor.  Wear loose-fitting clothes.  Do not smoke or use any products that contain nicotine or tobacco. If you need help quitting, ask your doctor.  Sleep with the head of your bed higher than your feet. Use a wedge under the mattress or  blocks under the bed frame to raise the head of the bed.  Chew sugar-free gum after meals. What foods should eat? Eat a healthy, well-balanced diet of fruits, vegetables, whole grains, low-fat dairy products, lean meats, fish, and poultry. Each person is different. Foods that may cause symptoms in one person may not cause any symptoms in another person. Work with your doctor to find foods that are safe for you. The items listed above may not be a complete list of what you can eat and drink. Contact a food expert for more options.   What foods should I avoid? Limiting some of these foods may help in managing the symptoms of GERD. Everyone is different. Talk with a food expert or your doctor to help you find the exact foods to avoid, if any. Fruits Any fruits prepared with added fat. Any fruits that cause symptoms. For some people, this may include citrus fruits, such as oranges, grapefruit, pineapple, and lemons. Vegetables Deep-fried vegetables. [place] fries. Any vegetables prepared with added fat. Any vegetables that cause symptoms. For some people, this may include tomatoes and tomato products, chili peppers, onions and garlic, and horseradish. Grains Pastries or quick breads with added fat. Meats and other proteins High-fat meats, such as fatty beef or pork, hot dogs, ribs, ham, sausage, salami, and bacon. Fried meat or protein, including fried fish and fried chicken. Nuts and nut butters, in large amounts. Dairy Whole milk and chocolate milk. Sour cream. Cream. Ice cream. Cream cheese. Milkshakes. Fats and oils Butter. Margarine. Shortening. Ghee. Beverages Coffee and tea, with or without caffeine. Carbonated beverages. Sodas. Energy drinks. Fruit juice made with acidic fruits, such as orange or grapefruit. Tomato juice. Alcoholic drinks. Sweets and desserts Chocolate and cocoa. Donuts. Seasonings and condiments Pepper. Peppermint and spearmint. Added salt. Any condiments, herbs, or  seasonings that cause symptoms. For some people, this may include curry, hot sauce, or vinegar-based salad dressings. The items listed above may not be a complete list of what you should not eat and drink. Contact a food expert for more options. Questions to ask your doctor Diet and lifestyle changes are often the first steps that are taken to manage symptoms of GERD. If diet and lifestyle changes do not help, talk with your doctor about taking medicines. Where to find more information  International Foundation for Gastrointestinal Disorders: aboutgerd.org Summary  When you have GERD, food and lifestyle choices are very important in easing your symptoms.  Eat small meals often instead of 3 large meals a day. Eat your meals slowly and in a place where you are relaxed.  Avoid bending over or lying down until 2-3 hours after eating.  Limit high-fat foods such as fatty meats or fried foods. This information is not intended to replace advice given to you by your health care provider. Make sure you discuss any questions you  have with your health care provider. Document Revised: 05/10/2020 Document Reviewed: 05/10/2020 Elsevier Patient Education  2021 [employer].

## 2020-12-13 NOTE — Assessment & Plan Note (Addendum)
55 year old female reporting couple months of rectal bleeding in the setting of constipation which is also new for her.  Blood is on toilet tissue and on stool.  Reports history of hemorrhoids with occasional rectal burning.  She is currently on MiraLAX daily, but has not noticed much improvement in constipation.  Last colonoscopy about 10 years ago in [place].  She has not sure of the results.  No unintentional weight loss.  She does note occasional lightheadedness, but this is more chronic.  Family history significant for maternal grandmother with colon cancer at age 72.  Suspect rectal bleeding may be secondary to benign anorectal source such as hemorrhoids.  However, cannot rule out colon polyps or malignancy.  Plan: 1.  Update CBC. 2.  Start Anusol rectal cream twice daily x10-14 days. 3.  Would like to try patient on Linzess 145 mcg.  We should be receiving samples this week. 4.  While waiting for Linzess, advised that she increase MiraLAX to 17 g in 8 ounces of water twice daily. 5.  Increase water intake as well as fruit and vegetable intake. 6.  Proceed with colonoscopy with propofol with Dr. Marletta Lor in the near future. The risks, benefits, and alternatives have been discussed with the patient in detail. The patient states understanding and desires to proceed.  ASA III See separate instructions for diabetes medication adjustments. 7.  Explained importance of bowels moving better prior to colonoscopy.  Requested progress report in 2 weeks. 8.  Follow-up after colonoscopy.

## 2020-12-13 NOTE — Telephone Encounter (Signed)
Ermalinda Memos PA advised pt's TCS/EGD-/+DIL w/Dr. Marletta Lor needs to be ASA 3 (she was scheduled for ASA 2 during OV today and given instructions). She also needs to try and get constipation better managed prior to procedure.  Called pt, rescheduled procedure to 01/18/21 (AM procedure) ASA 3. She's aware she will be mailed new instructions w/pre-op/covid test appt.   Endo scheduler informed to cancel previous orders. New orders entered.

## 2020-12-13 NOTE — Assessment & Plan Note (Addendum)
Patient reports new onset constipation for the last couple of months with associated rectal bleeding.  Blood is on toilet tissue and on the stool.  Mild abdominal pain prior to BMs that resolved thereafter.  No unintentional weight loss. Reports last colonoscopy was 10 years ago in [place], but she cannot remember the results.  Her maternal grandmother had a history of colon cancer at age 54.  She is currently taking MiraLAX once daily, but has not noticed much improvement in constipation thus far.  Etiology of change in bowel habits and rectal bleeding is not clear. She does report history of hemorrhoids which could be etiology of rectal bleeding.  However, cannot rule out colon polyps or malignancy.  Plan: 1.  I would like to try patient on Linzess 145 mcg.  We should be receiving samples this week, and I have requested nursing staff to let patient know once we have samples. 2.  While waiting on Linzess, increase MiraLAX to 17 g in 8 ounces of water twice daily. 3.  Increase water intake and drinking enough to keep urine pale yellow to clear. 4.  Increase daily fruit and vegetable intake. 5.  Proceed colonoscopy with propofol with Dr. Marletta Lor in the near future. The risks, benefits, and alternatives have been discussed with the patient in detail. The patient states understanding and desires to proceed.  ASA III See separate instructions for diabetes medication adjustments. 6.  Explained the importance of bowels moving well prior to colonoscopy.  Requested progress report in 2 weeks. 7.  Follow-up in office after colonoscopy.

## 2020-12-13 NOTE — Assessment & Plan Note (Addendum)
Solid food dysphagia x6 months.  Symptoms occurring daily requiring regurgitation at times.  No with chronic history of GERD that is uncontrolled on Protonix 40 mg daily.  Suspect uncontrolled GERD is likely contributing to dysphagia.  May also have esophageal web, ring, or stricture.  Plan: Proceed with EGD +/-dilation with propofol with Dr. Marletta Lor in the near future. The risks, benefits, and alternatives have been discussed with the patient in detail. The patient states understanding and desires to proceed.  ASA III See separate instructions for diabetes medication adjustments for procedure. Increase Protonix to 40 mg twice daily. Follow-up after procedure.

## 2020-12-13 NOTE — Assessment & Plan Note (Addendum)
Chronic history of GERD not adequately managed on Protonix 40 mg daily.  Currently having breakthrough symptoms daily as well as solid food dysphagia requiring regurgitation at times.  Associated nausea and rare vomiting if GERD symptoms are severe.  Denies hematemesis or abdominal pain.  Abdominal exam with mild TTP across upper abdomen.  Denies NSAIDs.  Reports she has previously tried OTC Prilosec, Nexium, and Pepcid.   Suspect patient may also have component of gastritis.  Cannot rule out H. Pylori.  Chronic uncontrolled GERD likely contributing to dysphagia as well.  Plan: Increase Protonix to 40 mg twice daily. Counseled on GERD diet/lifestyle.  Separate instructions provided. Proceed with EGD +/-dilation with propofol with Dr. Marletta Lor in the near future. The risks, benefits, and alternatives have been discussed with the patient in detail. The patient states understanding and desires to proceed.  ASA III See separate instructions for diabetes medication adjustments for procedure. Follow-up after procedure.

## 2020-12-14 NOTE — Telephone Encounter (Signed)
Pre-op/covid test appt letter mailed with procedure instructions.

## 2020-12-16 ENCOUNTER — Telehealth: Payer: Self-pay

## 2020-12-16 NOTE — Telephone Encounter (Signed)
Pt called to discuss the instructions of the samples of Linzess 145 mcg provided at her apt on Monday 12/13/20. Pt was advised to take 1 Linzess 145 mch tablet 30 mins prior to her breakfast. Pt will call back with a progress report as directed.

## 2020-12-17 ENCOUNTER — Other Ambulatory Visit: Payer: Self-pay | Admitting: Family Medicine

## 2020-12-17 ENCOUNTER — Other Ambulatory Visit (HOSPITAL_COMMUNITY): Payer: 59

## 2020-12-17 NOTE — Telephone Encounter (Signed)
  Prescription Request  12/17/2020  What is the name of the medication or equipment? Syringes, Insulin, Metformin  Have you contacted your pharmacy to request a refill? (if applicable) Yes  Which pharmacy would you like this sent to? [facility]  Pt called stating that she needs refills on these Rx's ASAP.

## 2020-12-20 ENCOUNTER — Encounter (HOSPITAL_COMMUNITY): Payer: Self-pay

## 2020-12-20 ENCOUNTER — Ambulatory Visit (HOSPITAL_COMMUNITY): Admit: 2020-12-20 | Payer: 59

## 2020-12-20 SURGERY — COLONOSCOPY WITH PROPOFOL
Anesthesia: Monitor Anesthesia Care

## 2020-12-23 ENCOUNTER — Other Ambulatory Visit: Payer: Self-pay | Admitting: Family Medicine

## 2020-12-24 ENCOUNTER — Encounter: Payer: Self-pay | Admitting: Family Medicine

## 2020-12-24 ENCOUNTER — Telehealth: Payer: Self-pay

## 2020-12-24 ENCOUNTER — Telehealth: Payer: Self-pay | Admitting: Family Medicine

## 2020-12-24 MED ORDER — INSULIN LISPRO 100 UNIT/ML ~~LOC~~ SOLN: 10.0000 [IU] | Freq: Three times a day (TID) | SUBCUTANEOUS | 11 refills | Status: DC

## 2020-12-24 NOTE — Telephone Encounter (Signed)
Pt has called her insurance and they will cover Humalog

## 2020-12-24 NOTE — Telephone Encounter (Signed)
Sent Humalog for the patient because of insurance coverage changes.

## 2020-12-24 NOTE — Telephone Encounter (Signed)
Patient aware.

## 2020-12-24 NOTE — Telephone Encounter (Signed)
Tell her we do have some samples of the Novolin R as well I believe, check the fridge before but she can have some of that and it is close enough that she can use it instead of the NovoLog and then we will have to see what we can get covered for her at her next visit.  Have her ask your insurance company if they will accept Humalog instead of NovoLog

## 2020-12-24 NOTE — Telephone Encounter (Signed)
Patient aware and states she will send daughter to pick up

## 2020-12-27 ENCOUNTER — Telehealth: Payer: Self-pay | Admitting: *Deleted

## 2020-12-27 MED ORDER — HUMALOG 100 UNIT/ML ~~LOC~~ SOLN: 10.0000 [IU] | Freq: Three times a day (TID) | SUBCUTANEOUS | 11 refills | Status: DC

## 2020-12-27 NOTE — Telephone Encounter (Signed)
Insulin Lispro 100u/ml Vial PA fax came in - will start    Covered:  Humalog U-100 Insulin (Soln), PA Not Required Humalog U-100 Insulin (Crtg), PA Not Required Humalog KwikPen Insulin 100 Unit/Ml Inpn, PA Not Required Humalog Junior KwikPen U-100, PA Not Required  Will change to name brand and see if gets approved.

## 2021-01-01 ENCOUNTER — Encounter: Payer: Self-pay | Admitting: Family Medicine

## 2021-01-03 ENCOUNTER — Other Ambulatory Visit: Payer: Self-pay | Admitting: Family Medicine

## 2021-01-07 ENCOUNTER — Telehealth: Payer: Self-pay | Admitting: *Deleted

## 2021-01-07 NOTE — Telephone Encounter (Signed)
PA in process for humalog (Key: [id number])  Your information has been sent to [employer].

## 2021-01-10 ENCOUNTER — Telehealth: Payer: Self-pay | Admitting: Internal Medicine

## 2021-01-10 NOTE — Telephone Encounter (Signed)
Approved on February 26 The request has been approved. The authorization is effective from 01/08/2021 to 01/07/2022, as long as the member is enrolled in their current health plan. The request was reviewed and approved by a licensed clinical pharmacist. A written notification letter will follow with additional details.  [facility] aware

## 2021-01-10 NOTE — Telephone Encounter (Signed)
Called pt. She states she is going to have weekly back injections to help her for now. She wants to cancel procedure until she is finished with these. Patient will call back to reschedule. Called endo and LMOVM to cancel.

## 2021-01-10 NOTE — Telephone Encounter (Signed)
Patient wants to cancel her procedure  [phone]

## 2021-01-14 ENCOUNTER — Encounter (HOSPITAL_COMMUNITY): Admission: RE | Admit: 2021-01-14 | Payer: 59 | Source: Ambulatory Visit

## 2021-01-14 ENCOUNTER — Other Ambulatory Visit (HOSPITAL_COMMUNITY): Payer: 59

## 2021-01-18 ENCOUNTER — Ambulatory Visit (HOSPITAL_COMMUNITY): Admission: RE | Admit: 2021-01-18 | Payer: 59 | Source: Home / Self Care

## 2021-01-18 ENCOUNTER — Encounter (HOSPITAL_COMMUNITY): Admission: RE | Payer: Self-pay | Source: Home / Self Care

## 2021-01-18 SURGERY — COLONOSCOPY WITH PROPOFOL
Anesthesia: Monitor Anesthesia Care

## 2021-01-19 ENCOUNTER — Encounter: Payer: Self-pay | Admitting: Family Medicine

## 2021-01-26 ENCOUNTER — Ambulatory Visit: Payer: 59 | Admitting: Family Medicine

## 2021-02-01 ENCOUNTER — Other Ambulatory Visit: Payer: Self-pay | Admitting: Family Medicine

## 2021-02-08 ENCOUNTER — Telehealth: Payer: Self-pay

## 2021-02-08 MED ORDER — INSULIN REGULAR HUMAN 100 UNIT/ML IJ SOLN: 8.0000 [IU] | Freq: Three times a day (TID) | INTRAMUSCULAR | 5 refills | Status: DC

## 2021-02-08 NOTE — Telephone Encounter (Signed)
Lmtcb.

## 2021-02-08 NOTE — Telephone Encounter (Signed)
Please let her know that I called in the medicine for her so she can have a new prescription of that at the pharmacy.  If it is changed colors I do not know that I would use it.

## 2021-02-08 NOTE — Telephone Encounter (Signed)
lmtcb

## 2021-02-08 NOTE — Telephone Encounter (Signed)
I agree with the counseling, please give her a list of the counselors we have currently that we know that she could call somebody and get 1 set up.  I am okay with increasing her Cymbalta so she can take 120 mg, we will have to send it as two 60 mg tablets daily and give her a month with 2 refill which is enough to get through her appointment.

## 2021-02-09 ENCOUNTER — Encounter: Payer: Self-pay | Admitting: Family Medicine

## 2021-02-21 ENCOUNTER — Other Ambulatory Visit: Payer: Self-pay

## 2021-02-21 ENCOUNTER — Encounter: Payer: Self-pay | Admitting: Family Medicine

## 2021-02-21 ENCOUNTER — Ambulatory Visit (INDEPENDENT_AMBULATORY_CARE_PROVIDER_SITE_OTHER): Payer: 59 | Admitting: Family Medicine

## 2021-02-21 VITALS — BP 118/83 | HR 92 | Ht 64.0 in | Wt 232.0 lb

## 2021-02-21 DIAGNOSIS — E039 Hypothyroidism, unspecified: Secondary | ICD-10-CM | POA: Diagnosis not present

## 2021-02-21 DIAGNOSIS — F339 Major depressive disorder, recurrent, unspecified: Secondary | ICD-10-CM | POA: Diagnosis not present

## 2021-02-21 DIAGNOSIS — I10 Essential (primary) hypertension: Secondary | ICD-10-CM

## 2021-02-21 DIAGNOSIS — E119 Type 2 diabetes mellitus without complications: Secondary | ICD-10-CM

## 2021-02-21 DIAGNOSIS — M5442 Lumbago with sciatica, left side: Secondary | ICD-10-CM

## 2021-02-21 DIAGNOSIS — G8929 Other chronic pain: Secondary | ICD-10-CM

## 2021-02-21 DIAGNOSIS — Z794 Long term (current) use of insulin: Secondary | ICD-10-CM

## 2021-02-21 LAB — BAYER DCA HB A1C WAIVED: HB A1C (BAYER DCA - WAIVED): 6.1 % (ref <7.0)

## 2021-02-21 MED ORDER — OLANZAPINE 5 MG PO TABS: 5.0000 mg | ORAL_TABLET | Freq: Every day | ORAL | 2 refills | Status: DC

## 2021-02-21 NOTE — Progress Notes (Signed)
BP 118/83   Pulse 92   Ht '5\' 4"'  (1.626 m)   Wt 232 lb (105.2 kg)   SpO2 94%   BMI 39.82 kg/m    Subjective:   Patient ID: Female Tina Chapman, female    DOB: [birth year], 55 y.o.   MRN: [id number]  HPI: Tina Chapman is a 55 y.o. female presenting on 02/21/2021 for Medical Management of Chronic Issues and Depression   HPI Depression recheck She feels like she just down and depressed.  She has no thoughts of killing herself but just wants to die and is unhappy about her current situation and is unhappy about being dependent on family. Depression screen [facility] 2/9 02/21/2021 10/25/2020 04/19/2020 02/11/2020 12/03/2019  Decreased Interest 3 3 0 3 3  Down, Depressed, Hopeless 3 3 0 3 3  PHQ - 2 Score 6 6 0 6 6  Altered sleeping 3 3 - 3 3  Tired, decreased energy 3 3 - 3 3  Change in appetite 3 3 - 3 3  Feeling bad or failure about yourself  3 3 - 3 3  Trouble concentrating 3 3 - 3 3  Moving slowly or fidgety/restless 3 0 - 0 3  Suicidal thoughts 1 1 - 3 1  PHQ-9 Score 25 22 - 24 25  Difficult doing work/chores - - - Extremely dIfficult Extremely dIfficult     Hypothyroidism recheck Patient is coming in for thyroid recheck today as well. They deny any issues with hair changes or heat or cold problems or diarrhea or constipation. They deny any chest pain or palpitations. They are currently on levothyroxine 145mcrograms  Type 2 diabetes mellitus Patient comes in today for recheck of his diabetes. Patient has been currently taking Lantus and Novolin and Metformin. Patient is currently on an ACE inhibitor/ARB. Patient has not seen an ophthalmologist this year. Patient denies any issues with their feet. The symptom started onset as an adult hypertension and hypothyroidism ARE RELATED TO DM   Hypertension Patient is currently on lisinopril, and their blood pressure today is 118/83. Patient denies any lightheadedness or dizziness. Patient denies headaches, blurred vision, chest pains,  shortness of breath, or weakness. Denies any side effects from medication and is content with current medication.   Patient has been struggling a lot with her depression and her chronic back pain.  She is taking Cymbalta 120 mg already daily there is anything that can be added or help with that.  Relevant past medical, surgical, family and social history reviewed and updated as indicated. Interim medical history since our last visit reviewed. Allergies and medications reviewed and updated.  Review of Systems  Constitutional: Negative for chills and fever.  Eyes: Negative for visual disturbance.  Respiratory: Negative for chest tightness and shortness of breath.   Cardiovascular: Negative for chest pain and leg swelling.  Musculoskeletal: Positive for back pain. Negative for gait problem.  Skin: Negative for rash.  Neurological: Positive for weakness. Negative for light-headedness and headaches.  Psychiatric/Behavioral: Positive for dysphoric mood and sleep disturbance. Negative for agitation, behavioral problems, self-injury and suicidal ideas. The patient is nervous/anxious.   All other systems reviewed and are negative.   Per HPI unless specifically indicated above   Allergies as of 02/21/2021   No Known Allergies     Medication List       Accurate as of February 21, 2021 11:30 AM. If you have any questions, ask your nurse or doctor.        BD Insulin Syringe  U/F 31G X 5/16" 0.3 ML Misc Generic drug: Insulin Syringe-Needle U-100 USE FOUR TIMES DAILY AS DIRECTED   DULoxetine 60 MG capsule Commonly known as: Cymbalta Take 2 capsules (120 mg total) by mouth daily. What changed:   how much to take  when to take this   Embrace Lancets Ultra Thin 30G Misc Test BS up to four times daily Dx E11.9   fluticasone 50 MCG/ACT nasal spray Commonly known as: FLONASE Place 1 spray into both nostrils 2 (two) times daily as needed for allergies or rhinitis.   FreeStyle Libre 14 Day  Reader Kerrin Mo 1 each by Does not apply route 4 (four) times daily.   FreeStyle Libre 14 Day Sensor Misc 1 each by Does not apply route every 14 (fourteen) days.   gabapentin 300 MG capsule Commonly known as: NEURONTIN Take 300 mg by mouth 3 (three) times daily.   glucose blood test strip Test BS up to four times daily Dx E11.9   HM Pain Reliever 325 MG tablet Generic drug: acetaminophen Take 325 mg by mouth every 6 (six) hours as needed for pain.   hydrocortisone 2.5 % rectal cream Commonly known as: ANUSOL-HC Place 1 application rectally 2 (two) times daily. For 10-14 days.   ibuprofen 800 MG tablet Commonly known as: ADVIL Take 800 mg by mouth every 6 (six) hours as needed for pain.   insulin glargine 100 UNIT/ML injection Commonly known as: Lantus Inject 0.2 mLs (20 Units total) into the skin at bedtime.   insulin regular 100 units/mL injection Commonly known as: NOVOLIN R Inject 0.08 mLs (8 Units total) into the skin 3 (three) times daily before meals.   levothyroxine 137 MCG tablet Commonly known as: SYNTHROID Take 1 tablet (137 mcg total) by mouth daily before breakfast.   lisinopril 20 MG tablet Commonly known as: ZESTRIL Take 1 tablet (20 mg total) by mouth daily.   metFORMIN 500 MG tablet Commonly known as: GLUCOPHAGE Take 1 tablet (500 mg total) by mouth 2 (two) times daily with a meal. (Needs to be seen before next refill)   methocarbamol 500 MG tablet Commonly known as: ROBAXIN Take 500 mg by mouth 3 (three) times daily.   pantoprazole 40 MG tablet Commonly known as: PROTONIX Take 1 tablet (40 mg total) by mouth 2 (two) times daily.   polyethylene glycol powder 17 GM/SCOOP powder Commonly known as: GLYCOLAX/MIRALAX Take 17 g by mouth 2 (two) times daily as needed. What changed: when to take this   rosuvastatin 10 MG tablet Commonly known as: Crestor Take 1 tablet (10 mg total) by mouth daily.        Objective:   BP 118/83   Pulse 92   Ht  '5\' 4"'  (1.626 m)   Wt 232 lb (105.2 kg)   SpO2 94%   BMI 39.82 kg/m   Wt Readings from Last 3 Encounters:  02/21/21 232 lb (105.2 kg)  12/13/20 235 lb (106.6 kg)  10/25/20 218 lb (98.9 kg)    Physical Exam Vitals and nursing note reviewed.  Constitutional:      General: She is not in acute distress.    Appearance: She is well-developed. She is not diaphoretic.  Eyes:     Conjunctiva/sclera: Conjunctivae normal.     Pupils: Pupils are equal, round, and reactive to light.  Cardiovascular:     Rate and Rhythm: Normal rate and regular rhythm.     Heart sounds: Normal heart sounds. No murmur heard.   Pulmonary:  Effort: Pulmonary effort is normal. No respiratory distress.     Breath sounds: Normal breath sounds. No wheezing.  Skin:    General: Skin is warm and dry.     Findings: No rash.  Neurological:     Mental Status: She is alert and oriented to person, place, and time.     Coordination: Coordination normal.  Psychiatric:        Mood and Affect: Mood is anxious and depressed.        Behavior: Behavior normal.        Thought Content: Thought content does not include suicidal ideation. Thought content does not include suicidal plan.       Assessment & Plan:   Problem List Items Addressed This Visit      Cardiovascular and Mediastinum   Hypertension   Relevant Orders   CBC with Differential/Platelet   CMP14+EGFR   Lipid panel     Endocrine   Hypothyroidism (acquired)   Relevant Orders   Thyroid Panel With TSH   Diabetes mellitus, type II, insulin dependent (HCC)   Relevant Orders   Bayer DCA Hb A1c Waived   CBC with Differential/Platelet   CMP14+EGFR   Lipid panel     Other   Depression, recurrent (HCC) - Primary   Relevant Medications   OLANZapine (ZYPREXA) 5 MG tablet   Chronic lumbar pain   Relevant Medications   OLANZapine (ZYPREXA) 5 MG tablet      Looked up some studies and showing that they are starting it but it does show that some  atypical antipsychotics can help an adjunct for depression but can also help as adjunct for pain, specifically mentioned in the articles that I would that was olanzapine. Patient has thoughts of wanting to be dead but actually no thoughts of wanting to kill herself but just is in so much pain that she does not want to be here anymore. Follow up plan: Return in about 4 weeks (around 03/21/2021), or if symptoms worsen or fail to improve, for Depression and pain recheck.  Counseling provided for all of the vaccine components No orders of the defined types were placed in this encounter.   Caryl Pina, MD [facility] 02/21/2021, 11:30 AM

## 2021-02-22 LAB — LIPID PANEL
Chol/HDL Ratio: 2.5 ratio (ref 0.0–4.4)
Cholesterol, Total: 140 mg/dL (ref 100–199)
HDL: 56 mg/dL (ref >39)
LDL Chol Calc (NIH): 60 mg/dL (ref 0–99)
Triglycerides: 143 mg/dL (ref 0–149)
VLDL Cholesterol Cal: 24 mg/dL (ref 5–40)

## 2021-02-22 LAB — CMP14+EGFR
ALT: 44 IU/L — ABNORMAL HIGH (ref 0–32)
AST: 37 IU/L (ref 0–40)
Albumin/Globulin Ratio: 1.7 (ref 1.2–2.2)
Albumin: 4.6 g/dL (ref 3.8–4.9)
Alkaline Phosphatase: 97 IU/L (ref 44–121)
BUN/Creatinine Ratio: 23 (ref 9–23)
BUN: 15 mg/dL (ref 6–24)
Bilirubin Total: 0.9 mg/dL (ref 0.0–1.2)
CO2: 23 mmol/L (ref 20–29)
Calcium: 10.3 mg/dL — ABNORMAL HIGH (ref 8.7–10.2)
Chloride: 100 mmol/L (ref 96–106)
Creatinine, Ser: 0.65 mg/dL (ref 0.57–1.00)
Globulin, Total: 2.7 g/dL (ref 1.5–4.5)
Glucose: 100 mg/dL — ABNORMAL HIGH (ref 65–99)
Potassium: 5.1 mmol/L (ref 3.5–5.2)
Sodium: 142 mmol/L (ref 134–144)
Total Protein: 7.3 g/dL (ref 6.0–8.5)
eGFR: 105 mL/min/{1.73_m2} (ref >59)

## 2021-02-22 LAB — THYROID PANEL WITH TSH
Free Thyroxine Index: 5.1 — ABNORMAL HIGH (ref 1.2–4.9)
T3 Uptake Ratio: 31 % (ref 24–39)
T4, Total: 16.5 ug/dL — ABNORMAL HIGH (ref 4.5–12.0)
TSH: 0.022 u[IU]/mL — ABNORMAL LOW (ref 0.450–4.500)

## 2021-02-22 LAB — CBC WITH DIFFERENTIAL/PLATELET
Basophils Absolute: 0.1 10*3/uL (ref 0.0–0.2)
Basos: 1 %
EOS (ABSOLUTE): 0.4 10*3/uL (ref 0.0–0.4)
Eos: 5 %
Hematocrit: 43.9 % (ref 34.0–46.6)
Hemoglobin: 14.9 g/dL (ref 11.1–15.9)
Immature Grans (Abs): 0 10*3/uL (ref 0.0–0.1)
Immature Granulocytes: 0 %
Lymphocytes Absolute: 2.6 10*3/uL (ref 0.7–3.1)
Lymphs: 34 %
MCH: 30.1 pg (ref 26.6–33.0)
MCHC: 33.9 g/dL (ref 31.5–35.7)
MCV: 89 fL (ref 79–97)
Monocytes Absolute: 0.7 10*3/uL (ref 0.1–0.9)
Monocytes: 9 %
Neutrophils Absolute: 4 10*3/uL (ref 1.4–7.0)
Neutrophils: 51 %
Platelets: 278 10*3/uL (ref 150–450)
RBC: 4.95 x10E6/uL (ref 3.77–5.28)
RDW: 12 % (ref 11.7–15.4)
WBC: 7.7 10*3/uL (ref 3.4–10.8)

## 2021-02-24 ENCOUNTER — Other Ambulatory Visit: Payer: Self-pay

## 2021-02-24 DIAGNOSIS — E039 Hypothyroidism, unspecified: Secondary | ICD-10-CM

## 2021-02-24 MED ORDER — LEVOTHYROXINE SODIUM 125 MCG PO TABS: 125.0000 ug | ORAL_TABLET | Freq: Every day | ORAL | 1 refills | Status: DC

## 2021-03-17 ENCOUNTER — Other Ambulatory Visit: Payer: Self-pay

## 2021-03-17 ENCOUNTER — Encounter: Payer: Self-pay | Admitting: Family Medicine

## 2021-03-17 ENCOUNTER — Ambulatory Visit (INDEPENDENT_AMBULATORY_CARE_PROVIDER_SITE_OTHER): Payer: 59 | Admitting: Family Medicine

## 2021-03-17 VITALS — BP 118/82 | HR 103 | Ht 64.0 in | Wt 239.0 lb

## 2021-03-17 DIAGNOSIS — R400 Somnolence: Secondary | ICD-10-CM

## 2021-03-17 DIAGNOSIS — M5442 Lumbago with sciatica, left side: Secondary | ICD-10-CM

## 2021-03-17 DIAGNOSIS — E039 Hypothyroidism, unspecified: Secondary | ICD-10-CM

## 2021-03-17 DIAGNOSIS — I10 Essential (primary) hypertension: Secondary | ICD-10-CM | POA: Diagnosis not present

## 2021-03-17 DIAGNOSIS — R519 Headache, unspecified: Secondary | ICD-10-CM

## 2021-03-17 DIAGNOSIS — F339 Major depressive disorder, recurrent, unspecified: Secondary | ICD-10-CM | POA: Diagnosis not present

## 2021-03-17 DIAGNOSIS — G8929 Other chronic pain: Secondary | ICD-10-CM

## 2021-03-17 MED ORDER — OLANZAPINE 10 MG PO TABS: 10.0000 mg | ORAL_TABLET | Freq: Every day | ORAL | 3 refills | Status: DC

## 2021-03-17 NOTE — Progress Notes (Signed)
BP 118/82   Pulse (!) 103   Ht 5\' 4"  (1.626 m)   Wt 239 lb (108.4 kg)   SpO2 96%   BMI 41.02 kg/m    Subjective:   Patient ID: Tina Chapman, female    DOB: 13-Mar-1966, 55 y.o.   MRN: [id number]  HPI: Tina Chapman is a 55 y.o. female presenting on 03/17/2021 for Depression   HPI Hypothyroidism recheck Patient is coming in for thyroid recheck today as well. They deny any issues with hair changes or heat or cold problems or diarrhea or constipation. They deny any chest pain or palpitations. They are currently on levothyroxine 05/17/2021   Hypertension Patient is currently on lisinopril, and their blood pressure today is 118/82. Patient denies any lightheadedness or dizziness. Patient denies headaches, blurred vision, chest pains, shortness of breath, or weakness. Denies any side effects from medication and is content with current medication.   Patient does have waking up headaches in the morning and generally just feeling achy and she is not getting good rest.  She is morbidly obese as well and likely candidate for sleep apnea test.  Anxiety and depression. Patient is coming in for anxiety depression recheck.  She is been on Cymbalta but we recently started Zyprexa and she says it did feel great the first couple days that she took it and then it felt like it went away.  She says now does not really do hardly and therefore but she denies any side effects for it.  She denies any suicidal ideations. Depression screen [facility] 2/9 03/17/2021 02/21/2021 10/25/2020 04/19/2020 02/11/2020  Decreased Interest 3 3 3  0 3  Down, Depressed, Hopeless 3 3 3  0 3  PHQ - 2 Score 6 6 6  0 6  Altered sleeping - 3 3 - 3  Tired, decreased energy - 3 3 - 3  Change in appetite - 3 3 - 3  Feeling bad or failure about yourself  - 3 3 - 3  Trouble concentrating - 3 3 - 3  Moving slowly or fidgety/restless - 3 0 - 0  Suicidal thoughts - 1 1 - 3  PHQ-9 Score - 25 22 - 24  Difficult doing work/chores - - -  - Extremely dIfficult     Relevant past medical, surgical, family and social history reviewed and updated as indicated. Interim medical history since our last visit reviewed. Allergies and medications reviewed and updated.  Review of Systems  Constitutional: Negative for chills and fever.  HENT: Negative for congestion, ear discharge and ear pain.   Eyes: Negative for redness and visual disturbance.  Respiratory: Negative for chest tightness and shortness of breath.   Cardiovascular: Negative for chest pain and leg swelling.  Musculoskeletal: Positive for back pain (Chronic back pain, sees specialist). Negative for gait problem.  Skin: Negative for rash.  Neurological: Positive for headaches. Negative for light-headedness.  Psychiatric/Behavioral: Positive for sleep disturbance. Negative for agitation and behavioral problems.  All other systems reviewed and are negative.   Per HPI unless specifically indicated above   Allergies as of 03/17/2021   No Known Allergies     Medication List       Accurate as of Mar 17, 2021  1:55 PM. If you have any questions, ask your nurse or doctor.        BD Insulin Syringe U/F 31G X 5/16" 0.3 ML Misc Generic drug: Insulin Syringe-Needle U-100 USE FOUR TIMES DAILY AS DIRECTED   DULoxetine 60 MG capsule Commonly known as:  Cymbalta Take 2 capsules (120 mg total) by mouth daily. What changed:   how much to take  when to take this   Embrace Lancets Ultra Thin 30G Misc Test BS up to four times daily Dx E11.9   fluticasone 50 MCG/ACT nasal spray Commonly known as: FLONASE Place 1 spray into both nostrils 2 (two) times daily as needed for allergies or rhinitis.   FreeStyle Libre 14 Day Reader Hardie Pulley 1 each by Does not apply route 4 (four) times daily.   FreeStyle Libre 14 Day Sensor Misc 1 each by Does not apply route every 14 (fourteen) days.   gabapentin 300 MG capsule Commonly known as: NEURONTIN Take 300 mg by mouth 3 (three) times  daily.   glucose blood test strip Test BS up to four times daily Dx E11.9   HM Pain Reliever 325 MG tablet Generic drug: acetaminophen Take 325 mg by mouth every 6 (six) hours as needed for pain.   hydrocortisone 2.5 % rectal cream Commonly known as: ANUSOL-HC Place 1 application rectally 2 (two) times daily. For 10-14 days.   ibuprofen 800 MG tablet Commonly known as: ADVIL Take 800 mg by mouth every 6 (six) hours as needed for pain.   insulin glargine 100 UNIT/ML injection Commonly known as: Lantus Inject 0.2 mLs (20 Units total) into the skin at bedtime.   insulin regular 100 units/mL injection Commonly known as: NOVOLIN R Inject 0.08 mLs (8 Units total) into the skin 3 (three) times daily before meals.   levothyroxine 125 MCG tablet Commonly known as: SYNTHROID Take 1 tablet (125 mcg total) by mouth daily.   lisinopril 20 MG tablet Commonly known as: ZESTRIL Take 1 tablet (20 mg total) by mouth daily.   metFORMIN 500 MG tablet Commonly known as: GLUCOPHAGE Take 1 tablet (500 mg total) by mouth 2 (two) times daily with a meal. (Needs to be seen before next refill)   methocarbamol 500 MG tablet Commonly known as: ROBAXIN Take 500 mg by mouth 3 (three) times daily.   OLANZapine 10 MG tablet Commonly known as: ZyPREXA Take 1 tablet (10 mg total) by mouth at bedtime. What changed:   medication strength  how much to take Changed by: Elige Radon Neamiah Sciarra, MD   pantoprazole 40 MG tablet Commonly known as: PROTONIX Take 1 tablet (40 mg total) by mouth 2 (two) times daily.   polyethylene glycol powder 17 GM/SCOOP powder Commonly known as: GLYCOLAX/MIRALAX Take 17 g by mouth 2 (two) times daily as needed. What changed: when to take this   rosuvastatin 10 MG tablet Commonly known as: Crestor Take 1 tablet (10 mg total) by mouth daily.        Objective:   BP 118/82   Pulse (!) 103   Ht 5\' 4"  (1.626 m)   Wt 239 lb (108.4 kg)   SpO2 96%   BMI 41.02 kg/m    Wt Readings from Last 3 Encounters:  03/17/21 239 lb (108.4 kg)  02/21/21 232 lb (105.2 kg)  12/13/20 235 lb (106.6 kg)    Physical Exam Vitals and nursing note reviewed.  Constitutional:      General: She is not in acute distress.    Appearance: She is well-developed. She is not diaphoretic.  Eyes:     Conjunctiva/sclera: Conjunctivae normal.  Cardiovascular:     Rate and Rhythm: Normal rate and regular rhythm.     Heart sounds: Normal heart sounds. No murmur heard.   Pulmonary:     Effort: Pulmonary  effort is normal. No respiratory distress.     Breath sounds: Normal breath sounds. No wheezing.  Musculoskeletal:        General: Normal range of motion.     Thoracic back: Tenderness (Right-sided) present.     Lumbar back: Tenderness present.  Skin:    General: Skin is warm and dry.     Findings: No rash.  Neurological:     Mental Status: She is alert and oriented to person, place, and time.     Coordination: Coordination normal.  Psychiatric:        Behavior: Behavior normal.       Assessment & Plan:   Problem List Items Addressed This Visit      Cardiovascular and Mediastinum   Hypertension     Endocrine   Hypothyroidism (acquired) - Primary     Other   Depression, recurrent (HCC)   Relevant Medications   OLANZapine (ZYPREXA) 10 MG tablet   Chronic lumbar pain    Other Visit Diagnoses    Daytime somnolence       Relevant Orders   Ambulatory referral to Sleep Studies   Morning headache       Relevant Orders   Ambulatory referral to Sleep Studies      Recommended therapies to help with her back, will do sleep study referral. Follow up plan: Return in about 2 months (around 05/17/2021), or if symptoms worsen or fail to improve, for Depression and diabetes recheck..  Counseling provided for all of the vaccine components Orders Placed This Encounter  Procedures  . Ambulatory referral to Sleep Studies    Arville Care, MD [facility] 03/17/2021, 1:55 PM

## 2021-04-05 ENCOUNTER — Encounter: Payer: Self-pay | Admitting: Family Medicine

## 2021-04-05 ENCOUNTER — Telehealth: Payer: Self-pay | Admitting: Family Medicine

## 2021-04-05 MED ORDER — INSULIN GLARGINE 100 UNIT/ML ~~LOC~~ SOLN: 20.0000 [IU] | Freq: Every day | SUBCUTANEOUS | 2 refills | Status: DC

## 2021-04-05 NOTE — Telephone Encounter (Signed)
  Prescription Request  04/05/2021  What is the name of the medication or equipment? Insulin Glargine 100 unit/ml injection. Patient is out and needs today  Have you contacted your pharmacy to request a refill? (if applicable) YES  Which pharmacy would you like this sent to? [facility]   Patient notified that their request is being sent to the clinical staff for review and that they should receive a response within 2 business days.

## 2021-04-05 NOTE — Telephone Encounter (Signed)
Closing encounter, refilled through [facility]

## 2021-04-13 ENCOUNTER — Encounter: Payer: Self-pay | Admitting: Family Medicine

## 2021-04-18 ENCOUNTER — Other Ambulatory Visit: Payer: Self-pay | Admitting: Family Medicine

## 2021-04-27 ENCOUNTER — Other Ambulatory Visit: Payer: Self-pay

## 2021-04-27 DIAGNOSIS — K219 Gastro-esophageal reflux disease without esophagitis: Secondary | ICD-10-CM

## 2021-04-27 MED ORDER — METFORMIN HCL 500 MG PO TABS
500.0000 mg | ORAL_TABLET | Freq: Two times a day (BID) | ORAL | 3 refills | Status: DC
Start: 2021-04-27 — End: 2021-11-25

## 2021-04-27 MED ORDER — TRUE METRIX AIR GLUCOSE METER DEVI: 0 refills | Status: DC

## 2021-04-27 MED ORDER — INSULIN REGULAR HUMAN 100 UNIT/ML IJ SOLN: 8.0000 [IU] | Freq: Three times a day (TID) | INTRAMUSCULAR | 5 refills | Status: DC

## 2021-04-27 MED ORDER — PANTOPRAZOLE SODIUM 40 MG PO TBEC: 40.0000 mg | DELAYED_RELEASE_TABLET | Freq: Two times a day (BID) | ORAL | 3 refills | Status: DC

## 2021-04-27 MED ORDER — "INSULIN SYRINGE-NEEDLE U-100 31G X 5/16"" 0.3 ML MISC": 11 refills | Status: DC

## 2021-04-27 MED ORDER — GLOBAL INJECT EASE LANCETS 30G MISC: 11 refills | Status: DC

## 2021-04-27 MED ORDER — INSULIN GLARGINE 100 UNIT/ML ~~LOC~~ SOLN: 20.0000 [IU] | Freq: Every day | SUBCUTANEOUS | 2 refills | Status: DC

## 2021-04-28 ENCOUNTER — Encounter: Payer: Self-pay | Admitting: Family Medicine

## 2021-04-29 ENCOUNTER — Telehealth: Payer: Self-pay | Admitting: Family Medicine

## 2021-04-29 ENCOUNTER — Other Ambulatory Visit: Payer: Self-pay

## 2021-04-29 MED ORDER — INSULIN REGULAR HUMAN 100 UNIT/ML IJ SOLN: 8.0000 [IU] | Freq: Three times a day (TID) | INTRAMUSCULAR | 5 refills | Status: DC

## 2021-04-29 NOTE — Telephone Encounter (Signed)
SENT TO PHARMACY 

## 2021-04-29 NOTE — Telephone Encounter (Signed)
[facility] does not have this rx in stock till Monday. Pt sent a message to dettinger about a sample that she has that she thinks she can use till Monday. Please call back

## 2021-04-29 NOTE — Telephone Encounter (Signed)
Novolin sent again to [facility]

## 2021-05-12 ENCOUNTER — Telehealth: Payer: Self-pay | Admitting: Family Medicine

## 2021-05-12 MED ORDER — INSULIN GLARGINE 100 UNIT/ML ~~LOC~~ SOLN: 20.0000 [IU] | Freq: Every day | SUBCUTANEOUS | 0 refills | Status: DC

## 2021-05-12 MED ORDER — TRUE METRIX BLOOD GLUCOSE TEST VI STRP: ORAL_STRIP | 3 refills | Status: DC

## 2021-05-12 NOTE — Telephone Encounter (Signed)
  Prescription Request  05/12/2021  What is the name of the medication or equipment? Test Strips for [employer]? & Lantus?  Have you contacted your pharmacy to request a refill? (if applicable) no  Which pharmacy would you like this sent to? [facility] in [place]   Patient notified that their request is being sent to the clinical staff for review and that they should receive a response within 2 business days.    Dettinger's pt.

## 2021-05-12 NOTE — Telephone Encounter (Signed)
Pt aware refills sent to pharmacy 

## 2021-05-19 ENCOUNTER — Encounter: Payer: Self-pay | Admitting: Family Medicine

## 2021-05-19 ENCOUNTER — Ambulatory Visit (INDEPENDENT_AMBULATORY_CARE_PROVIDER_SITE_OTHER): Payer: 59 | Admitting: Family Medicine

## 2021-05-19 ENCOUNTER — Other Ambulatory Visit: Payer: Self-pay

## 2021-05-19 VITALS — BP 97/69 | HR 102 | Ht 64.0 in | Wt 238.0 lb

## 2021-05-19 DIAGNOSIS — F339 Major depressive disorder, recurrent, unspecified: Secondary | ICD-10-CM

## 2021-05-19 DIAGNOSIS — I10 Essential (primary) hypertension: Secondary | ICD-10-CM

## 2021-05-19 DIAGNOSIS — Z794 Long term (current) use of insulin: Secondary | ICD-10-CM

## 2021-05-19 DIAGNOSIS — E119 Type 2 diabetes mellitus without complications: Secondary | ICD-10-CM | POA: Diagnosis not present

## 2021-05-19 DIAGNOSIS — E039 Hypothyroidism, unspecified: Secondary | ICD-10-CM

## 2021-05-19 LAB — BAYER DCA HB A1C WAIVED: HB A1C (BAYER DCA - WAIVED): 6.6 % (ref <7.0)

## 2021-05-19 MED ORDER — OLANZAPINE 15 MG PO TABS: 15.0000 mg | ORAL_TABLET | Freq: Every day | ORAL | 1 refills | Status: DC

## 2021-05-19 MED ORDER — INSULIN GLARGINE 100 UNIT/ML ~~LOC~~ SOLN: 20.0000 [IU] | Freq: Every day | SUBCUTANEOUS | 3 refills | Status: DC

## 2021-05-19 MED ORDER — LISINOPRIL 10 MG PO TABS: 10.0000 mg | ORAL_TABLET | Freq: Every day | ORAL | 3 refills | Status: DC

## 2021-05-19 NOTE — Progress Notes (Signed)
BP 97/69   Pulse (!) 102   Ht 5\' 4"  (1.626 m)   Wt 238 lb (108 kg)   SpO2 97%   BMI 40.85 kg/m    Subjective:   Patient ID: Tina Chapman, female    DOB: May 24, 1966, 55 y.o.   MRN: [id number]  HPI: Tina Chapman is a 55 y.o. female presenting on 05/19/2021 for Medical Management of Chronic Issues, Depression, and Diabetes   HPI Depression recheck Patient is somnolent and depression because of her job situation she is having difficulties finding a job and she does not feel well because she cannot be independent but she cannot find jobs because of her back and neck pain and health issues.  Zyprexa is helping but her social situation is really bad right now not screening a lot of issue for her.  She denies any suicidal plans at this point but does have thoughts of wishing she was better off dead. Depression screen [facility] 01-20-2023 05/19/2021 03/17/2021 02/21/2021 10/25/2020 04/19/2020  Decreased Interest 3 3 3 3  0  Down, Depressed, Hopeless 3 3 3 3  0  PHQ - 2 Score 6 6 6 6  0  Altered sleeping 3 - 3 3 -  Tired, decreased energy 3 - 3 3 -  Change in appetite 3 - 3 3 -  Feeling bad or failure about yourself  3 - 3 3 -  Trouble concentrating 2 - 3 3 -  Moving slowly or fidgety/restless 2 - 3 0 -  Suicidal thoughts 3 - 1 1 -  PHQ-9 Score 25 - 25 22 -  Difficult doing work/chores - - - - -    Hypertension Patient is currently on lisinopril, and their blood pressure today is 97/69.  Patient does she states she gets some dizziness especially when she stands up quick.. Patient denies headaches, blurred vision, chest pains, shortness of breath, or weakness. Denies any side effects from medication and is content with current medication.   Hypothyroidism recheck Patient is coming in for thyroid recheck today as well. They deny any issues with hair changes or heat or cold problems or diarrhea or constipation. They deny any chest pain or palpitations. They are currently on levothyroxine 06/19/2020    Type 2 diabetes mellitus Patient comes in today for recheck of his diabetes. Patient has been currently taking metformin and Lantus and Novolin. Patient is currently on an ACE inhibitor/ARB. Patient has not seen an ophthalmologist this year. Patient denies any new issues with their feet. The symptom started onset as an adult hypertension and hypothyroidism ARE RELATED TO DM   Relevant past medical, surgical, family and social history reviewed and updated as indicated. Interim medical history since our last visit reviewed. Allergies and medications reviewed and updated.  Review of Systems  Constitutional:  Negative for chills and fever.  Eyes:  Negative for visual disturbance.  Respiratory:  Negative for chest tightness and shortness of breath.   Cardiovascular:  Negative for chest pain and leg swelling.  Musculoskeletal:  Positive for back pain.  Skin:  Negative for rash.  Neurological:  Negative for light-headedness and headaches.  Psychiatric/Behavioral:  Positive for decreased concentration, dysphoric mood and sleep disturbance. Negative for agitation, behavioral problems, self-injury and suicidal ideas. The patient is nervous/anxious.   All other systems reviewed and are negative.  Per HPI unless specifically indicated above   Allergies as of 05/19/2021   No Known Allergies      Medication List        Accurate  as of May 19, 2021 10:19 AM. If you have any questions, ask your nurse or doctor.          DULoxetine 60 MG capsule Commonly known as: Cymbalta Take 2 capsules (120 mg total) by mouth daily. What changed:  how much to take when to take this   fluticasone 50 MCG/ACT nasal spray Commonly known as: FLONASE Place 1 spray into both nostrils 2 (two) times daily as needed for allergies or rhinitis.   FreeStyle Libre 14 Day Reader Hardie Pulley 1 each by Does not apply route 4 (four) times daily.   FreeStyle Libre 14 Day Sensor Misc 1 each by Does not apply route every 14  (fourteen) days.   gabapentin 300 MG capsule Commonly known as: NEURONTIN Take 300 mg by mouth 3 (three) times daily.   Global Inject Ease Lancets 30G Misc USE FOUR TIMES DAILY AS DIRECTED Dx E11.9   HM Pain Reliever 325 MG tablet Generic drug: acetaminophen Take 325 mg by mouth every 6 (six) hours as needed for pain.   hydrocortisone 2.5 % rectal cream Commonly known as: ANUSOL-HC Place 1 application rectally 2 (two) times daily. For 10-14 days.   ibuprofen 800 MG tablet Commonly known as: ADVIL Take 800 mg by mouth every 6 (six) hours as needed for pain.   insulin glargine 100 UNIT/ML injection Commonly known as: Lantus Inject 0.2 mLs (20 Units total) into the skin at bedtime.   insulin regular 100 units/mL injection Commonly known as: NOVOLIN R Inject 0.08 mLs (8 Units total) into the skin 3 (three) times daily before meals.   Insulin Syringe-Needle U-100 31G X 5/16" 0.3 ML Misc Commonly known as: BD Insulin Syringe U/F USE FOUR TIMES DAILY AS DIRECTED   levothyroxine 125 MCG tablet Commonly known as: SYNTHROID Take 1 tablet (125 mcg total) by mouth daily.   lisinopril 10 MG tablet Commonly known as: ZESTRIL Take 1 tablet (10 mg total) by mouth daily. What changed:  medication strength how much to take Changed by: Nils Pyle, MD   metFORMIN 500 MG tablet Commonly known as: GLUCOPHAGE Take 1 tablet (500 mg total) by mouth 2 (two) times daily with a meal. (Needs to be seen before next refill)   methocarbamol 500 MG tablet Commonly known as: ROBAXIN Take 500 mg by mouth 3 (three) times daily.   OLANZapine 15 MG tablet Commonly known as: ZyPREXA Take 1 tablet (15 mg total) by mouth at bedtime. What changed:  medication strength how much to take Changed by: Elige Radon Indie Nickerson, MD   pantoprazole 40 MG tablet Commonly known as: PROTONIX Take 1 tablet (40 mg total) by mouth 2 (two) times daily.   polyethylene glycol powder 17 GM/SCOOP  powder Commonly known as: GLYCOLAX/MIRALAX Take 17 g by mouth 2 (two) times daily as needed. What changed: when to take this   rosuvastatin 10 MG tablet Commonly known as: Crestor Take 1 tablet (10 mg total) by mouth daily.   True Metrix Air Glucose Meter Devi Check blood sugars 4x daily  E11.9   True Metrix Blood Glucose Test test strip Generic drug: glucose blood Test BS up to four times daily Dx E11.9         Objective:   BP 97/69   Pulse (!) 102   Ht 5\' 4"  (1.626 m)   Wt 238 lb (108 kg)   SpO2 97%   BMI 40.85 kg/m   Wt Readings from Last 3 Encounters:  05/19/21 238 lb (108 kg)  03/17/21 239 lb (108.4 kg)  02/21/21 232 lb (105.2 kg)    Physical Exam Vitals and nursing note reviewed.  Constitutional:      General: She is not in acute distress.    Appearance: She is well-developed. She is not diaphoretic.  Eyes:     Conjunctiva/sclera: Conjunctivae normal.  Cardiovascular:     Rate and Rhythm: Normal rate and regular rhythm.     Heart sounds: Normal heart sounds. No murmur heard. Pulmonary:     Effort: Pulmonary effort is normal. No respiratory distress.     Breath sounds: Normal breath sounds. No wheezing.  Musculoskeletal:        General: No tenderness. Normal range of motion.  Skin:    General: Skin is warm and dry.     Findings: No rash.  Neurological:     Mental Status: She is alert and oriented to person, place, and time.     Coordination: Coordination normal.  Psychiatric:        Mood and Affect: Mood is anxious and depressed.        Behavior: Behavior normal.        Thought Content: Thought content does not include suicidal ideation. Thought content does not include suicidal plan.      Assessment & Plan:   Problem List Items Addressed This Visit       Cardiovascular and Mediastinum   Hypertension - Primary   Relevant Medications   lisinopril (ZESTRIL) 10 MG tablet     Endocrine   Hypothyroidism (acquired)   Diabetes mellitus, type  II, insulin dependent (HCC)   Relevant Medications   insulin glargine (LANTUS) 100 UNIT/ML injection   lisinopril (ZESTRIL) 10 MG tablet     Other   Depression, recurrent (HCC)   Relevant Medications   OLANZapine (ZYPREXA) 15 MG tablet    Will increase patient's Zyprexa from 10 to 15 mg and see if that helps along with her depression  Continue current medicine including insulin. Follow up plan: Return in about 3 months (around 08/19/2021), or if symptoms worsen or fail to improve, for Thyroid and diabetes and hypertension and depression.  Counseling provided for all of the vaccine components No orders of the defined types were placed in this encounter.   Arville Care, MD [facility] 05/19/2021, 10:19 AM

## 2021-05-19 NOTE — Addendum Note (Signed)
Addended by: Arville Care on: 05/19/2021 10:20 AM   Modules accepted: Orders

## 2021-05-20 LAB — THYROID PANEL WITH TSH
Free Thyroxine Index: 2.6 (ref 1.2–4.9)
T3 Uptake Ratio: 24 % (ref 24–39)
T4, Total: 10.7 ug/dL (ref 4.5–12.0)
TSH: 0.031 u[IU]/mL — ABNORMAL LOW (ref 0.450–4.500)

## 2021-06-09 ENCOUNTER — Encounter: Payer: Self-pay | Admitting: Family Medicine

## 2021-06-10 IMAGING — DX DG ABDOMEN 1V
2 series · 2 of 2 positions shown · non-contrast
Comparison: None.

CLINICAL DATA: Right upper quadrant abdominal pain for 1 week.

EXAM:
ABDOMEN - 1 VIEW

[abdomen kub (1 of 2)]
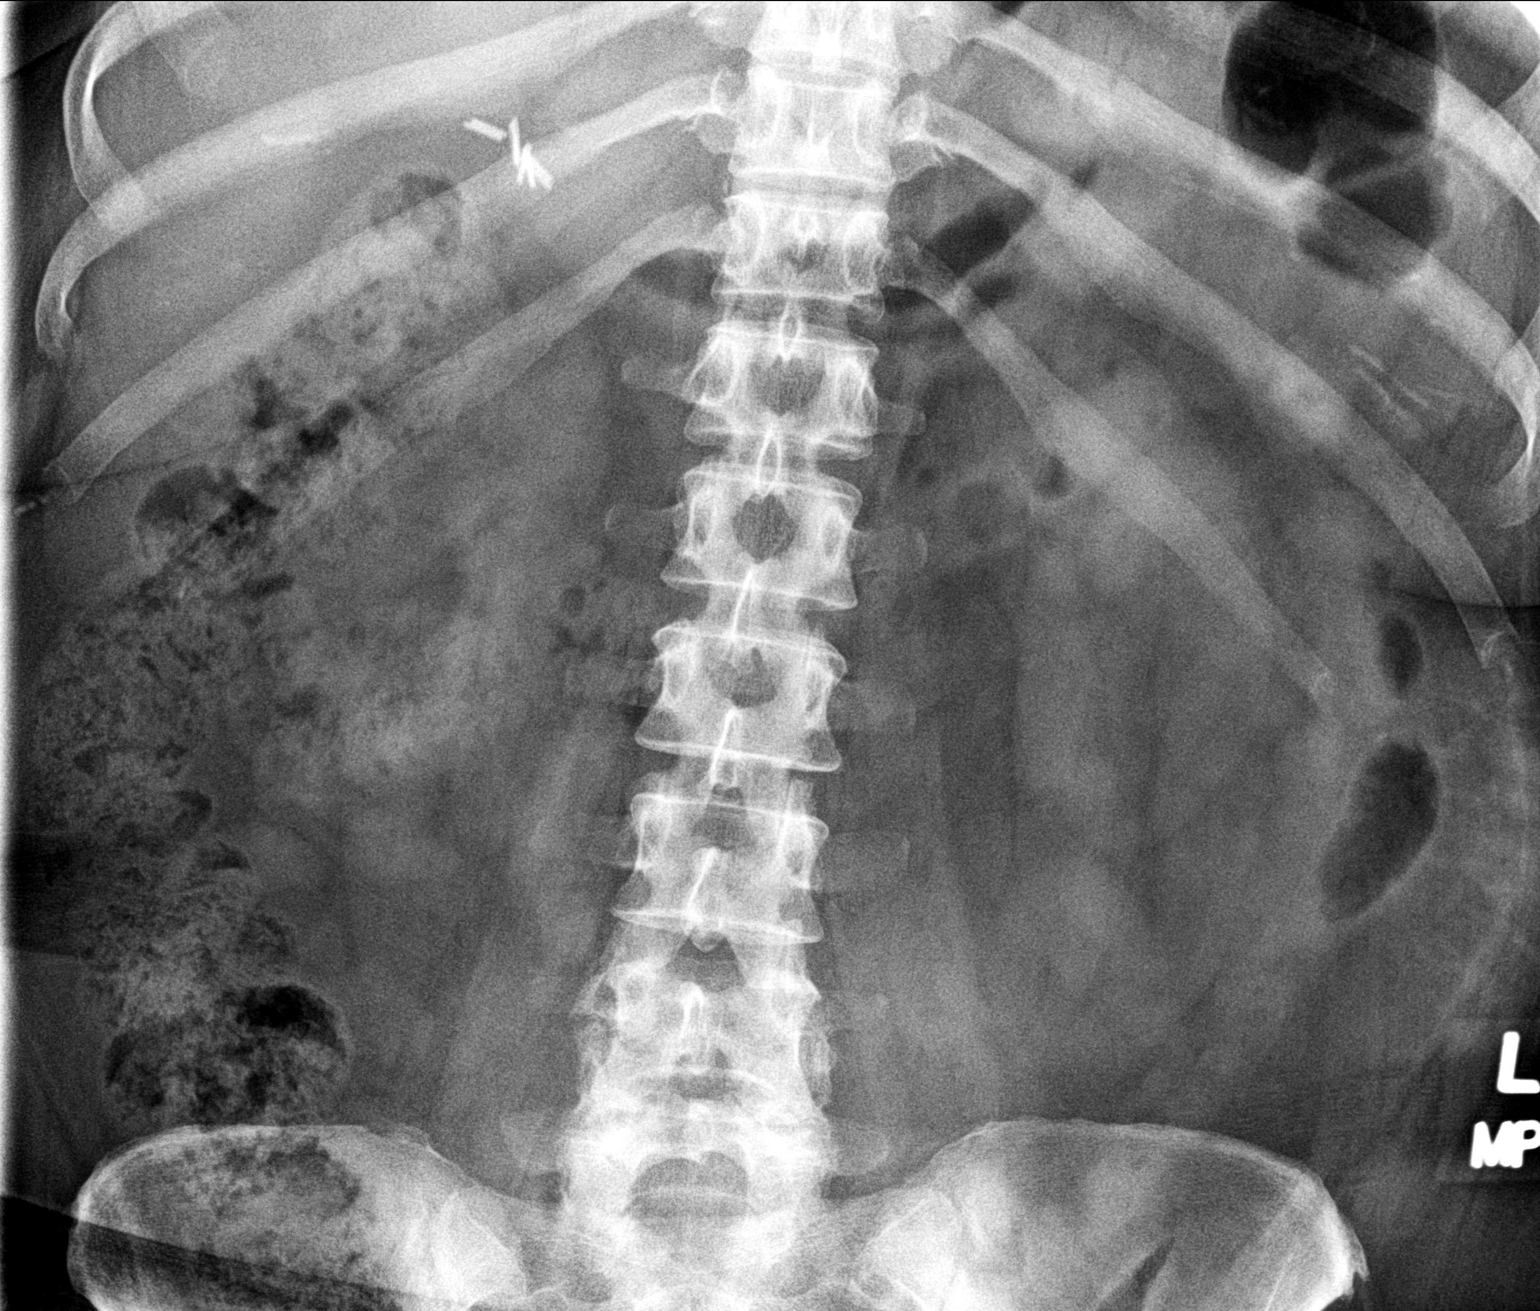

[abdomen kub (2 of 2)]
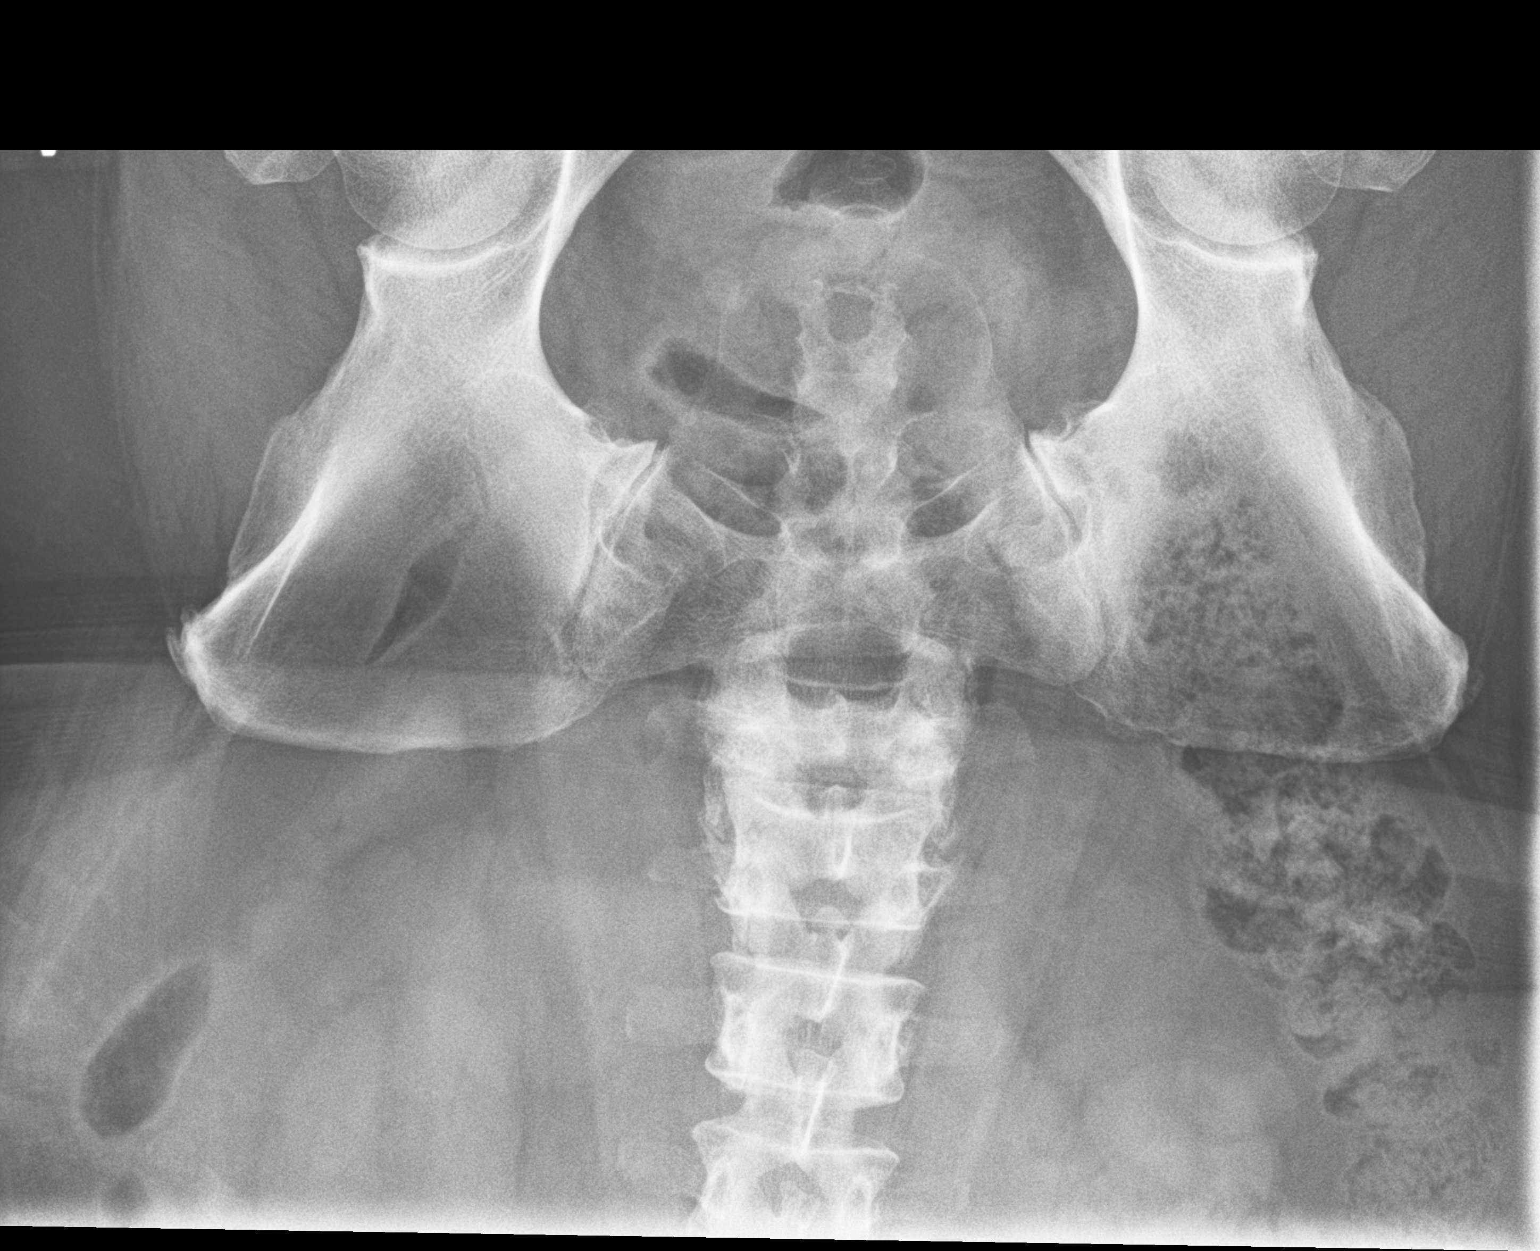

[2 of 2 positions shown; findings below may reference images not displayed]

FINDINGS: The bowel gas pattern is unremarkable. No findings for obstruction
or perforation. The soft tissue shadows of the abdomen are
maintained. No worrisome calcifications. Surgical clips are noted in
the right upper quadrant from a probable previous cholecystectomy.
The bony structures are intact.
IMPRESSION: Unremarkable abdominal radiograph.

## 2021-07-20 ENCOUNTER — Encounter: Payer: Self-pay | Admitting: Family Medicine

## 2021-07-23 ENCOUNTER — Other Ambulatory Visit: Payer: Self-pay | Admitting: Family Medicine

## 2021-07-23 DIAGNOSIS — E119 Type 2 diabetes mellitus without complications: Secondary | ICD-10-CM

## 2021-08-15 ENCOUNTER — Other Ambulatory Visit: Payer: Self-pay | Admitting: Family Medicine

## 2021-08-15 DIAGNOSIS — E039 Hypothyroidism, unspecified: Secondary | ICD-10-CM

## 2021-08-19 ENCOUNTER — Ambulatory Visit (INDEPENDENT_AMBULATORY_CARE_PROVIDER_SITE_OTHER): Payer: 59 | Admitting: Family Medicine

## 2021-08-19 ENCOUNTER — Other Ambulatory Visit: Payer: Self-pay

## 2021-08-19 ENCOUNTER — Encounter: Payer: Self-pay | Admitting: Family Medicine

## 2021-08-19 VITALS — BP 182/118 | HR 93 | Temp 97.3°F | Ht 64.0 in | Wt 251.8 lb

## 2021-08-19 DIAGNOSIS — I10 Essential (primary) hypertension: Secondary | ICD-10-CM

## 2021-08-19 DIAGNOSIS — F339 Major depressive disorder, recurrent, unspecified: Secondary | ICD-10-CM

## 2021-08-19 DIAGNOSIS — Z794 Long term (current) use of insulin: Secondary | ICD-10-CM

## 2021-08-19 DIAGNOSIS — E039 Hypothyroidism, unspecified: Secondary | ICD-10-CM | POA: Diagnosis not present

## 2021-08-19 DIAGNOSIS — Z23 Encounter for immunization: Secondary | ICD-10-CM

## 2021-08-19 DIAGNOSIS — E118 Type 2 diabetes mellitus with unspecified complications: Secondary | ICD-10-CM | POA: Diagnosis not present

## 2021-08-19 DIAGNOSIS — E119 Type 2 diabetes mellitus without complications: Secondary | ICD-10-CM

## 2021-08-19 LAB — BAYER DCA HB A1C WAIVED: HB A1C (BAYER DCA - WAIVED): 6.3 % — ABNORMAL HIGH (ref 4.8–5.6)

## 2021-08-19 MED ORDER — MECLIZINE HCL 25 MG PO TABS: 25.0000 mg | ORAL_TABLET | Freq: Three times a day (TID) | ORAL | 1 refills | Status: DC | PRN

## 2021-08-19 MED ORDER — ROSUVASTATIN CALCIUM 10 MG PO TABS: 10.0000 mg | ORAL_TABLET | Freq: Every day | ORAL | 3 refills | Status: DC

## 2021-08-19 MED ORDER — LISINOPRIL 20 MG PO TABS: 20.0000 mg | ORAL_TABLET | Freq: Every day | ORAL | 3 refills | Status: DC

## 2021-08-19 MED ORDER — INSULIN GLARGINE 100 UNIT/ML ~~LOC~~ SOLN: 20.0000 [IU] | Freq: Every day | SUBCUTANEOUS | 3 refills | Status: DC

## 2021-08-19 MED ORDER — LEVOTHYROXINE SODIUM 125 MCG PO TABS: 125.0000 ug | ORAL_TABLET | Freq: Every day | ORAL | 3 refills | Status: DC

## 2021-08-19 NOTE — Progress Notes (Signed)
BP (!) 182/118   Pulse 93   Temp (!) 97.3 F (36.3 C)   Ht _0  (1.626 m)   Wt 251 lb 12.8 oz (114.2 kg)   SpO2 94%   BMI 43.22 kg/m    Subjective:   Patient ID: Tina Chapman, female    DOB: 1966/05/06, 55 y.o.   MRN: [id number]  HPI: Tina Chapman is a 55 y.o. female presenting on 08/19/2021 for Diabetes   HPI Hypothyroidism recheck Patient is coming in for thyroid recheck today as well. They deny any issues with hair changes or heat or cold problems or diarrhea or constipation. They deny any chest pain or palpitations. They are currently on levothyroxine 125 micrograms   Hypertension Patient is currently on lisinopril, and their blood pressure today is 182/118. Marland Kitchen Patient denies headaches, blurred vision, chest pains, shortness of breath, or weakness. Denies any side effects from medication and is content with current medication.   Type 2 diabetes mellitus Patient comes in today for recheck of his diabetes. Patient has been currently taking metformin and Lantus. Patient is currently on an ACE inhibitor/ARB. Patient has not seen an ophthalmologist this year. Patient denies any issues with their feet. The symptom started onset as an adult hypothyroidism and hypertension ARE RELATED TO DM   Depression recheck Patient still has some depression but it is under better control a lot of that is from her back pain and issues that she has been having that have been causing her issues.  She will continue this and she continues to see a back pain doctor to help with her back.  Relevant past medical, surgical, family and social history reviewed and updated as indicated. Interim medical history since our last visit reviewed. Allergies and medications reviewed and updated.  Review of Systems  Constitutional:  Negative for chills and fever.  HENT:  Negative for congestion, ear discharge and ear pain.   Eyes:  Negative for redness and visual disturbance.  Respiratory:  Negative for  chest tightness and shortness of breath.   Cardiovascular:  Negative for chest pain and leg swelling.  Genitourinary:  Negative for difficulty urinating and dysuria.  Musculoskeletal:  Positive for arthralgias, back pain, myalgias and neck pain. Negative for gait problem.  Skin:  Negative for rash.  Neurological:  Negative for light-headedness and headaches.  Psychiatric/Behavioral:  Negative for agitation and behavioral problems.   All other systems reviewed and are negative.  Per HPI unless specifically indicated above   Allergies as of 08/19/2021   No Known Allergies      Medication List        Accurate as of August 19, 2021 11:05 AM. If you have any questions, ask your nurse or doctor.          DULoxetine 60 MG capsule Commonly known as: Cymbalta Take 2 capsules (120 mg total) by mouth daily. What changed:  how much to take when to take this   fluticasone 50 MCG/ACT nasal spray Commonly known as: FLONASE Place 1 spray into both nostrils 2 (two) times daily as needed for allergies or rhinitis.   FreeStyle Libre 14 Day Reader Kerrin Mo 1 each by Does not apply route 4 (four) times daily.   FreeStyle Libre 14 Day Sensor Misc 1 each by Does not apply route every 14 (fourteen) days.   gabapentin 600 MG tablet Commonly known as: NEURONTIN Take 600 mg by mouth 3 (three) times daily. What changed: Another medication with the same name was removed. Continue  taking this medication, and follow the directions you see here. Changed by: Fransisca Kaufmann Jiraiya Mcewan, MD   Global Inject Ease Lancets 30G Misc USE FOUR TIMES DAILY AS DIRECTED Dx E11.9   HM Pain Reliever 325 MG tablet Generic drug: acetaminophen Take 325 mg by mouth every 6 (six) hours as needed for pain.   hydrocortisone 2.5 % rectal cream Commonly known as: ANUSOL-HC Place 1 application rectally 2 (two) times daily. For 10-14 days.   ibuprofen 800 MG tablet Commonly known as: ADVIL Take 800 mg by mouth every 6 (six)  hours as needed for pain.   insulin glargine 100 UNIT/ML injection Commonly known as: Lantus Inject 0.2 mLs (20 Units total) into the skin at bedtime. What changed: See the new instructions. Changed by: Fransisca Kaufmann Sanchez Hemmer, MD   insulin regular 100 units/mL injection Commonly known as: NOVOLIN R Inject 0.08 mLs (8 Units total) into the skin 3 (three) times daily before meals.   Insulin Syringe-Needle U-100 31G X 5/16" 0.3 ML Misc Commonly known as: BD Insulin Syringe U/F USE FOUR TIMES DAILY AS DIRECTED   levothyroxine 125 MCG tablet Commonly known as: SYNTHROID Take 1 tablet (125 mcg total) by mouth daily before breakfast. What changed: See the new instructions. Changed by: Fransisca Kaufmann Rhen Dossantos, MD   lisinopril 20 MG tablet Commonly known as: ZESTRIL Take 1 tablet (20 mg total) by mouth daily. What changed:  medication strength how much to take Changed by: Worthy Rancher, MD   meclizine 25 MG tablet Commonly known as: ANTIVERT Take 1 tablet (25 mg total) by mouth 3 (three) times daily as needed for dizziness. Started by: Worthy Rancher, MD   metFORMIN 500 MG tablet Commonly known as: GLUCOPHAGE Take 1 tablet (500 mg total) by mouth 2 (two) times daily with a meal. (Needs to be seen before next refill)   methocarbamol 500 MG tablet Commonly known as: ROBAXIN Take 500 mg by mouth 3 (three) times daily.   OLANZapine 15 MG tablet Commonly known as: ZyPREXA Take 1 tablet (15 mg total) by mouth at bedtime.   pantoprazole 40 MG tablet Commonly known as: PROTONIX Take 1 tablet (40 mg total) by mouth 2 (two) times daily.   polyethylene glycol powder 17 GM/SCOOP powder Commonly known as: GLYCOLAX/MIRALAX Take 17 g by mouth 2 (two) times daily as needed. What changed: when to take this   rosuvastatin 10 MG tablet Commonly known as: Crestor Take 1 tablet (10 mg total) by mouth daily.   True Metrix Air Glucose Meter Devi Check blood sugars 4x daily  E11.9    True Metrix Blood Glucose Test test strip Generic drug: glucose blood Test BS up to four times daily Dx E11.9         Objective:   BP (!) 182/118   Pulse 93   Temp (!) 97.3 F (36.3 C)   Ht _0  (1.626 m)   Wt 251 lb 12.8 oz (114.2 kg)   SpO2 94%   BMI 43.22 kg/m   Wt Readings from Last 3 Encounters:  08/19/21 251 lb 12.8 oz (114.2 kg)  05/19/21 238 lb (108 kg)  03/17/21 239 lb (108.4 kg)    Physical Exam Vitals and nursing note reviewed.  Constitutional:      General: She is not in acute distress.    Appearance: She is well-developed. She is not diaphoretic.  Eyes:     Conjunctiva/sclera: Conjunctivae normal.  Cardiovascular:     Rate and Rhythm: Normal rate and  regular rhythm.     Heart sounds: Normal heart sounds. No murmur heard. Pulmonary:     Effort: Pulmonary effort is normal. No respiratory distress.     Breath sounds: Normal breath sounds. No wheezing.  Musculoskeletal:        General: No tenderness. Normal range of motion.  Skin:    General: Skin is warm and dry.     Findings: No rash.  Neurological:     Mental Status: She is alert and oriented to person, place, and time.     Coordination: Coordination normal.  Psychiatric:        Behavior: Behavior normal.      Assessment & Plan:   Problem List Items Addressed This Visit       Cardiovascular and Mediastinum   Hypertension   Relevant Medications   rosuvastatin (CRESTOR) 10 MG tablet   lisinopril (ZESTRIL) 20 MG tablet   Other Relevant Orders   CBC with Differential/Platelet   Lipid panel     Endocrine   Hypothyroidism (acquired) - Primary   Relevant Medications   levothyroxine (SYNTHROID) 125 MCG tablet   Other Relevant Orders   TSH   Diabetes mellitus, type II, insulin dependent (HCC)   Relevant Medications   insulin glargine (LANTUS) 100 UNIT/ML injection   rosuvastatin (CRESTOR) 10 MG tablet   lisinopril (ZESTRIL) 20 MG tablet     Other   Depression, recurrent (HCC)    Other Visit Diagnoses     Controlled type 2 diabetes mellitus with complication, with long-term current use of insulin (HCC)       Relevant Medications   insulin glargine (LANTUS) 100 UNIT/ML injection   rosuvastatin (CRESTOR) 10 MG tablet   lisinopril (ZESTRIL) 20 MG tablet   Other Relevant Orders   Bayer DCA Hb A1c Waived   CMP14+EGFR     Increase blood pressure medicine to 20 mg of lisinopril.  Continue current medicine for diabetes we will send medicine for vertigo based on symptoms. Continue current medicine,  Follow up plan: Return in about 3 months (around 11/19/2021), or if symptoms worsen or fail to improve, for diabetes and htn and thyroid.  Counseling provided for all of the vaccine components Orders Placed This Encounter  Procedures   Bayer DCA Hb A1c Waived   TSH   CMP14+EGFR   CBC with Differential/Platelet   Lipid panel    Caryl Pina, MD [facility] 08/19/2021, 11:05 AM

## 2021-08-20 LAB — LIPID PANEL
Chol/HDL Ratio: 2.6 ratio (ref 0.0–4.4)
Cholesterol, Total: 88 mg/dL — ABNORMAL LOW (ref 100–199)
HDL: 34 mg/dL — ABNORMAL LOW (ref >39)
LDL Chol Calc (NIH): 29 mg/dL (ref 0–99)
Triglycerides: 146 mg/dL (ref 0–149)
VLDL Cholesterol Cal: 25 mg/dL (ref 5–40)

## 2021-08-20 LAB — CBC WITH DIFFERENTIAL/PLATELET
Basophils Absolute: 0.1 10*3/uL (ref 0.0–0.2)
Basos: 1 %
EOS (ABSOLUTE): 0.1 10*3/uL (ref 0.0–0.4)
Eos: 2 %
Hematocrit: 39.9 % (ref 34.0–46.6)
Hemoglobin: 13.3 g/dL (ref 11.1–15.9)
Immature Grans (Abs): 0 10*3/uL (ref 0.0–0.1)
Immature Granulocytes: 0 %
Lymphocytes Absolute: 2 10*3/uL (ref 0.7–3.1)
Lymphs: 38 %
MCH: 29.1 pg (ref 26.6–33.0)
MCHC: 33.3 g/dL (ref 31.5–35.7)
MCV: 87 fL (ref 79–97)
Monocytes Absolute: 0.6 10*3/uL (ref 0.1–0.9)
Monocytes: 12 %
Neutrophils Absolute: 2.6 10*3/uL (ref 1.4–7.0)
Neutrophils: 47 %
Platelets: 229 10*3/uL (ref 150–450)
RBC: 4.57 x10E6/uL (ref 3.77–5.28)
RDW: 12.4 % (ref 11.7–15.4)
WBC: 5.4 10*3/uL (ref 3.4–10.8)

## 2021-08-20 LAB — CMP14+EGFR
ALT: 38 IU/L — ABNORMAL HIGH (ref 0–32)
AST: 34 IU/L (ref 0–40)
Albumin/Globulin Ratio: 1.5 (ref 1.2–2.2)
Albumin: 4.1 g/dL (ref 3.8–4.9)
Alkaline Phosphatase: 119 IU/L (ref 44–121)
BUN/Creatinine Ratio: 15 (ref 9–23)
BUN: 10 mg/dL (ref 6–24)
Bilirubin Total: 1.1 mg/dL (ref 0.0–1.2)
CO2: 21 mmol/L (ref 20–29)
Calcium: 9.5 mg/dL (ref 8.7–10.2)
Chloride: 104 mmol/L (ref 96–106)
Creatinine, Ser: 0.66 mg/dL (ref 0.57–1.00)
Globulin, Total: 2.7 g/dL (ref 1.5–4.5)
Glucose: 116 mg/dL — ABNORMAL HIGH (ref 70–99)
Potassium: 3.7 mmol/L (ref 3.5–5.2)
Sodium: 143 mmol/L (ref 134–144)
Total Protein: 6.8 g/dL (ref 6.0–8.5)
eGFR: 104 mL/min/{1.73_m2} (ref >59)

## 2021-08-20 LAB — TSH: TSH: 0.593 u[IU]/mL (ref 0.450–4.500)

## 2021-09-18 ENCOUNTER — Other Ambulatory Visit: Payer: Self-pay | Admitting: Family Medicine

## 2021-09-18 DIAGNOSIS — F339 Major depressive disorder, recurrent, unspecified: Secondary | ICD-10-CM

## 2021-09-27 ENCOUNTER — Other Ambulatory Visit: Payer: Self-pay | Admitting: Physical Medicine and Rehabilitation

## 2021-09-27 DIAGNOSIS — M47812 Spondylosis without myelopathy or radiculopathy, cervical region: Secondary | ICD-10-CM

## 2021-10-05 ENCOUNTER — Telehealth: Payer: Self-pay | Admitting: Family Medicine

## 2021-10-05 MED ORDER — INSULIN REGULAR HUMAN 100 UNIT/ML IJ SOLN: 8.0000 [IU] | Freq: Three times a day (TID) | INTRAMUSCULAR | 5 refills | Status: DC

## 2021-10-05 NOTE — Telephone Encounter (Signed)
Patient aware we have no samples. Asked if we could try and send to another pharmacy patient would like to try [facility]. Refill sent to [facility]. Patient aware

## 2021-10-05 NOTE — Telephone Encounter (Signed)
Patient needs samples of Novolin because her pharmacy does not have any in stock right now.  Please advise and call patient.

## 2021-10-17 ENCOUNTER — Other Ambulatory Visit: Payer: Self-pay

## 2021-10-17 ENCOUNTER — Ambulatory Visit
Admission: RE | Admit: 2021-10-17 | Discharge: 2021-10-17 | Disposition: A | Discharge status: Home / Self Care | Payer: 59 | Source: Ambulatory Visit | Attending: Physical Medicine and Rehabilitation | Admitting: Physical Medicine and Rehabilitation

## 2021-10-17 DIAGNOSIS — M47812 Spondylosis without myelopathy or radiculopathy, cervical region: Secondary | ICD-10-CM

## 2021-10-20 ENCOUNTER — Encounter: Payer: Self-pay | Admitting: Family Medicine

## 2021-10-20 MED ORDER — DULOXETINE HCL 30 MG PO CPEP: 30.0000 mg | ORAL_CAPSULE | Freq: Every day | ORAL | 1 refills | Status: DC

## 2021-11-04 ENCOUNTER — Telehealth: Payer: Self-pay | Admitting: Family Medicine

## 2021-11-04 NOTE — Telephone Encounter (Signed)
Pharmacy changed to [facility]

## 2021-11-08 ENCOUNTER — Other Ambulatory Visit: Payer: Self-pay | Admitting: Family Medicine

## 2021-11-09 ENCOUNTER — Other Ambulatory Visit: Payer: Self-pay | Admitting: Family Medicine

## 2021-11-21 ENCOUNTER — Telehealth: Payer: Self-pay | Admitting: Family Medicine

## 2021-11-21 DIAGNOSIS — F339 Major depressive disorder, recurrent, unspecified: Secondary | ICD-10-CM

## 2021-11-21 MED ORDER — TRUE METRIX BLOOD GLUCOSE TEST VI STRP: ORAL_STRIP | 3 refills | Status: DC

## 2021-11-21 MED ORDER — OLANZAPINE 15 MG PO TABS: ORAL_TABLET | ORAL | 0 refills | Status: DC

## 2021-11-21 MED ORDER — GLOBAL INJECT EASE LANCETS 30G MISC: 3 refills | Status: DC

## 2021-11-21 MED ORDER — INSULIN REGULAR HUMAN 100 UNIT/ML IJ SOLN: 8.0000 [IU] | Freq: Three times a day (TID) | INTRAMUSCULAR | 0 refills | Status: DC

## 2021-11-21 NOTE — Telephone Encounter (Signed)
Pt needing RFs on Novolin R, test strips, lancets & Olanzapine. Refills [facility] to [facility], pharmacy change d/t ins Needed to reschedule next weeks appt, she does not have copay amount

## 2021-11-22 ENCOUNTER — Other Ambulatory Visit: Payer: Self-pay | Admitting: *Deleted

## 2021-11-22 ENCOUNTER — Telehealth: Payer: Self-pay | Admitting: Family Medicine

## 2021-11-22 MED ORDER — "INSULIN SYRINGE-NEEDLE U-100 31G X 5/16"" 0.3 ML MISC": 3 refills | Status: DC

## 2021-11-22 NOTE — Telephone Encounter (Signed)
°  Prescription Request  11/22/2021  Is this a "Controlled Substance" medicine? no  Have you seen your PCP in the last 2 weeks? no  If YES, route message to pool  -  If NO, patient needs to be scheduled for appointment.  What is the name of the medication or equipment? Insulin syringes and test strips  but she needs a different kind the one that was prescribed are too expensive  Have you contacted your pharmacy to request a refill? yes   Which pharmacy would you like this sent to? [facility]   Patient notified that their request is being sent to the clinical staff for review and that they should receive a response within 2 business days.

## 2021-11-22 NOTE — Telephone Encounter (Signed)
Pt called her insurance and was told they will cover the accuchek di meter test strips lancets and [facility] [place] she is out of lancets test strips and syringes

## 2021-11-23 MED ORDER — ACCU-CHEK MULTICLIX LANCETS MISC: 3 refills | Status: DC

## 2021-11-23 MED ORDER — ACCU-CHEK GUIDE W/DEVICE KIT: 1.0000 | PACK | Freq: Two times a day (BID) | 0 refills | Status: AC

## 2021-11-23 MED ORDER — ACCU-CHEK GUIDE VI STRP: ORAL_STRIP | 3 refills | Status: DC

## 2021-11-23 NOTE — Telephone Encounter (Signed)
I spoke to pt and advised syringes were sent in yesterday and that Accu-Chek meter and supplies were sent in as requested.

## 2021-11-24 ENCOUNTER — Telehealth: Payer: Self-pay | Admitting: Family Medicine

## 2021-11-24 NOTE — Telephone Encounter (Signed)
Pt aware we don't have samples of Novolin R and that she should call Dr Clearance Coots for the refills on gabapentin.

## 2021-11-24 NOTE — Telephone Encounter (Signed)
Pt called stating that she needs refills for [facility] to [facility] and we dont know when they will be up and running again. Asked pt if there is another pharmacy we could send Rx to. Pt said no because [facility] only [facility].  Pt also wants to know if we have samples of Novolin R because her insurance doesn't cover it.

## 2021-11-24 NOTE — Telephone Encounter (Signed)
Dr. Louanne Skye,  Do you prescribe the gabapentin?  We do not have samples of Novolin R

## 2021-11-24 NOTE — Telephone Encounter (Signed)
It looks like Dr. Clearance Coots prescribes it

## 2021-11-25 ENCOUNTER — Telehealth: Payer: Self-pay | Admitting: Family Medicine

## 2021-11-25 ENCOUNTER — Other Ambulatory Visit: Payer: Self-pay | Admitting: Family Medicine

## 2021-11-25 ENCOUNTER — Ambulatory Visit: Payer: 59 | Admitting: Family Medicine

## 2021-11-25 DIAGNOSIS — Z794 Long term (current) use of insulin: Secondary | ICD-10-CM

## 2021-11-25 DIAGNOSIS — I10 Essential (primary) hypertension: Secondary | ICD-10-CM

## 2021-11-25 DIAGNOSIS — K219 Gastro-esophageal reflux disease without esophagitis: Secondary | ICD-10-CM

## 2021-11-25 DIAGNOSIS — E119 Type 2 diabetes mellitus without complications: Secondary | ICD-10-CM

## 2021-11-25 DIAGNOSIS — J0120 Acute ethmoidal sinusitis, unspecified: Secondary | ICD-10-CM

## 2021-11-25 DIAGNOSIS — E039 Hypothyroidism, unspecified: Secondary | ICD-10-CM

## 2021-11-25 MED ORDER — ROSUVASTATIN CALCIUM 10 MG PO TABS: 10.0000 mg | ORAL_TABLET | Freq: Every day | ORAL | 3 refills | Status: DC

## 2021-11-25 MED ORDER — LEVOTHYROXINE SODIUM 125 MCG PO TABS: 125.0000 ug | ORAL_TABLET | Freq: Every day | ORAL | 3 refills | Status: DC

## 2021-11-25 MED ORDER — INSULIN REGULAR HUMAN 100 UNIT/ML IJ SOLN: 8.0000 [IU] | Freq: Three times a day (TID) | INTRAMUSCULAR | 11 refills | Status: DC

## 2021-11-25 MED ORDER — LISINOPRIL 20 MG PO TABS: 20.0000 mg | ORAL_TABLET | Freq: Every day | ORAL | 3 refills | Status: DC

## 2021-11-25 MED ORDER — DULOXETINE HCL 60 MG PO CPEP: 120.0000 mg | ORAL_CAPSULE | Freq: Every day | ORAL | 1 refills | Status: DC

## 2021-11-25 MED ORDER — GABAPENTIN 600 MG PO TABS: 600.0000 mg | ORAL_TABLET | Freq: Three times a day (TID) | ORAL | 1 refills | Status: DC

## 2021-11-25 MED ORDER — INSULIN GLARGINE 100 UNIT/ML ~~LOC~~ SOLN: 20.0000 [IU] | Freq: Every day | SUBCUTANEOUS | 3 refills | Status: DC

## 2021-11-25 MED ORDER — METFORMIN HCL 500 MG PO TABS: 500.0000 mg | ORAL_TABLET | Freq: Two times a day (BID) | ORAL | 3 refills | Status: DC

## 2021-11-25 MED ORDER — DULOXETINE HCL 30 MG PO CPEP: 30.0000 mg | ORAL_CAPSULE | Freq: Every day | ORAL | 1 refills | Status: DC

## 2021-11-25 MED ORDER — PANTOPRAZOLE SODIUM 40 MG PO TBEC: 40.0000 mg | DELAYED_RELEASE_TABLET | Freq: Two times a day (BID) | ORAL | 3 refills | Status: DC

## 2021-11-25 MED ORDER — FLUTICASONE PROPIONATE 50 MCG/ACT NA SUSP: 1.0000 | Freq: Two times a day (BID) | NASAL | 6 refills | Status: DC | PRN

## 2021-11-25 NOTE — Telephone Encounter (Signed)
[facility] [place] [facility]. When asked for the names of medicines she said it was all of them and did not give any other information. Please call back.

## 2021-11-25 NOTE — Telephone Encounter (Signed)
Meds sent to correct pharm.  

## 2021-11-27 NOTE — Telephone Encounter (Signed)
I sent Tina Chapman instead, see if that 1 is covered

## 2021-11-28 ENCOUNTER — Other Ambulatory Visit: Payer: Self-pay | Admitting: Family Medicine

## 2021-11-28 DIAGNOSIS — E119 Type 2 diabetes mellitus without complications: Secondary | ICD-10-CM

## 2021-11-28 DIAGNOSIS — Z794 Long term (current) use of insulin: Secondary | ICD-10-CM

## 2021-11-28 NOTE — Telephone Encounter (Signed)
Pharmacy comment: Alternative Requested:NONFORM TRY LEVEMIR OR BASAGLAR.  All Pharmacy Suggested Alternatives:   Insulin Glargine (BASAGLAR KWIKPEN) 100 UNIT/ML insulin detemir (LEVEMIR) 100 UNIT/ML injection insulin detemir (LEVEMIR FLEXTOUCH) 100 UNIT/ML FlexPen

## 2021-11-30 NOTE — Telephone Encounter (Signed)
Basaglar is possible alternative now, resent prescription

## 2021-12-15 ENCOUNTER — Ambulatory Visit: Payer: Self-pay | Admitting: Family Medicine

## 2022-01-06 ENCOUNTER — Encounter: Payer: Self-pay | Admitting: Family Medicine

## 2022-01-06 ENCOUNTER — Ambulatory Visit: Payer: 59 | Admitting: Family Medicine

## 2022-01-06 VITALS — BP 138/93 | HR 105 | Ht 64.0 in | Wt 245.0 lb

## 2022-01-06 DIAGNOSIS — E039 Hypothyroidism, unspecified: Secondary | ICD-10-CM

## 2022-01-06 DIAGNOSIS — Z1231 Encounter for screening mammogram for malignant neoplasm of breast: Secondary | ICD-10-CM | POA: Diagnosis not present

## 2022-01-06 DIAGNOSIS — R69 Illness, unspecified: Secondary | ICD-10-CM | POA: Diagnosis not present

## 2022-01-06 DIAGNOSIS — F339 Major depressive disorder, recurrent, unspecified: Secondary | ICD-10-CM

## 2022-01-06 DIAGNOSIS — E119 Type 2 diabetes mellitus without complications: Secondary | ICD-10-CM

## 2022-01-06 DIAGNOSIS — Z23 Encounter for immunization: Secondary | ICD-10-CM

## 2022-01-06 DIAGNOSIS — Z794 Long term (current) use of insulin: Secondary | ICD-10-CM | POA: Diagnosis not present

## 2022-01-06 DIAGNOSIS — I10 Essential (primary) hypertension: Secondary | ICD-10-CM | POA: Diagnosis not present

## 2022-01-06 DIAGNOSIS — F419 Anxiety disorder, unspecified: Secondary | ICD-10-CM

## 2022-01-06 LAB — BAYER DCA HB A1C WAIVED: HB A1C (BAYER DCA - WAIVED): 6.2 % — ABNORMAL HIGH (ref 4.8–5.6)

## 2022-01-06 MED ORDER — DULOXETINE HCL 30 MG PO CPEP: 30.0000 mg | ORAL_CAPSULE | Freq: Every day | ORAL | 1 refills | Status: DC

## 2022-01-06 MED ORDER — DULOXETINE HCL 60 MG PO CPEP: 120.0000 mg | ORAL_CAPSULE | Freq: Every day | ORAL | 1 refills | Status: DC

## 2022-01-06 NOTE — Progress Notes (Signed)
BP (!) 138/93    Pulse (!) 105    Ht _0  (1.626 m)    Wt 245 lb (111.1 kg)    SpO2 97%    BMI 42.05 kg/m    Subjective:   Patient ID: Tina Chapman, female    DOB: April 29, 1966, 56 y.o.   MRN: [id number]  HPI: Tina Chapman is a 56 y.o. female presenting on 01/06/2022 for Medical Management of Chronic Issues, Diabetes, and Hypothyroidism   HPI Anxiety depression Patient is coming in today for anxiety and depression recheck.  She is currently taking Cymbalta 150  mg and Zyprexa 15 mg and also takes gabapentin for pain and neuropathy.  She feels like she doing well and she is not crying all the time and sleeping well. Depression screen [facility] 2/9 01/06/2022 08/19/2021 05/19/2021 03/17/2021 02/21/2021  Decreased Interest _1 Down, Depressed, Hopeless _2 PHQ - 2 Score _3 Altered sleeping _4 - 3  Tired, decreased energy _5 - 3  Change in appetite _6 - 3  Feeling bad or failure about yourself  _7 - 3  Trouble concentrating _8 - 3  Moving slowly or fidgety/restless _9 - 3  Suicidal thoughts _10 - 1  PHQ-9 Score _11 - 25  Difficult doing work/chores - - - - -  Some recent data might be hidden     Hypothyroidism recheck Patient is coming in for thyroid recheck today as well. They deny any issues with hair changes or heat or cold problems or diarrhea or constipation. They deny any chest pain or palpitations. They are currently on levothyroxine 162mcrograms   Type 2 diabetes mellitus Patient comes in today for recheck of his diabetes. Patient has been currently taking metformin and Basaglar 20 and NovoLog 8-3 times daily with meals and. Patient is currently on an ACE inhibitor/ARB. Patient has not seen an ophthalmologist this year. Patient denies any issues with their feet. The symptom started onset as an adult hypothyroidism and hypertension and depression ARE RELATED TO DM   Hypertension Patient is currently on lisinopril, and their blood  pressure today is 138/93 although she usually runs better at home.. Patient denies any lightheadedness or dizziness. Patient denies headaches, blurred vision, chest pains, shortness of breath, or weakness. Denies any side effects from medication and is content with current medication.   Patient takes gabapentin to help with her myalgia and back pain and it does help significantly  Relevant past medical, surgical, family and social history reviewed and updated as indicated. Interim medical history since our last visit reviewed. Allergies and medications reviewed and updated.  Review of Systems  Constitutional:  Negative for chills and fever.  Eyes:  Negative for visual disturbance.  Respiratory:  Negative for chest tightness and shortness of breath.   Cardiovascular:  Negative for chest pain and leg swelling.  Genitourinary:  Negative for difficulty urinating and dysuria.  Musculoskeletal:  Positive for arthralgias, back pain and myalgias. Negative for gait problem.  Skin:  Negative for rash.  Neurological:  Negative for light-headedness and headaches.  Psychiatric/Behavioral:  Negative for agitation and behavioral problems.   All other systems reviewed and are negative.  Per HPI unless specifically indicated above   Allergies as of 01/06/2022   No Known Allergies      Medication List  Accurate as of January 06, 2022  1:54 PM. If you have any questions, ask your nurse or doctor.          Accu-Chek Guide test strip Generic drug: glucose blood Use to check BS 4 times daily   Accu-Chek Guide w/Device Kit 1 each by Does not apply route 2 (two) times daily.   accu-chek multiclix lancets Use to check BS 4 times daily   Basaglar KwikPen 100 UNIT/ML Inject 20 Units into the skin at bedtime.   DULoxetine 60 MG capsule Commonly known as: CYMBALTA Take 2 capsules (120 mg total) by mouth daily.   DULoxetine 30 MG capsule Commonly known as: Cymbalta Take 1 capsule (30  mg total) by mouth daily. Take with 60 mg tablet for total of 90 mg   fluticasone 50 MCG/ACT nasal spray Commonly known as: FLONASE Place 1 spray into both nostrils 2 (two) times daily as needed for allergies or rhinitis.   FreeStyle Libre 14 Day Reader Kerrin Mo 1 each by Does not apply route 4 (four) times daily.   gabapentin 600 MG tablet Commonly known as: NEURONTIN Take 1 tablet (600 mg total) by mouth 3 (three) times daily.   HM Pain Reliever 325 MG tablet Generic drug: acetaminophen Take 325 mg by mouth every 6 (six) hours as needed for pain.   hydrocortisone 2.5 % rectal cream Commonly known as: ANUSOL-HC Place 1 application rectally 2 (two) times daily. For 10-14 days.   ibuprofen 800 MG tablet Commonly known as: ADVIL Take 800 mg by mouth every 6 (six) hours as needed for pain.   insulin regular 100 units/mL injection Commonly known as: NOVOLIN R Inject 0.08 mLs (8 Units total) into the skin 3 (three) times daily before meals.   Insulin Syringe-Needle U-100 31G X 5/16" 0.3 ML Misc Commonly known as: BD Insulin Syringe U/F USE FOUR TIMES DAILY w/ insulin Dx E11.9   levothyroxine 125 MCG tablet Commonly known as: SYNTHROID Take 1 tablet (125 mcg total) by mouth daily before breakfast.   lisinopril 20 MG tablet Commonly known as: ZESTRIL Take 1 tablet (20 mg total) by mouth daily.   meclizine 25 MG tablet Commonly known as: ANTIVERT Take 1 tablet (25 mg total) by mouth 3 (three) times daily as needed for dizziness.   metFORMIN 500 MG tablet Commonly known as: GLUCOPHAGE Take 1 tablet (500 mg total) by mouth 2 (two) times daily with a meal. (Needs to be seen before next refill)   methocarbamol 500 MG tablet Commonly known as: ROBAXIN Take 500 mg by mouth 3 (three) times daily.   OLANZapine 15 MG tablet Commonly known as: ZYPREXA TAKE 1 TABLET(15 MG) BY MOUTH AT BEDTIME   pantoprazole 40 MG tablet Commonly known as: PROTONIX Take 1 tablet (40 mg total) by  mouth 2 (two) times daily.   polyethylene glycol powder 17 GM/SCOOP powder Commonly known as: GLYCOLAX/MIRALAX Take 17 g by mouth 2 (two) times daily as needed. What changed: when to take this   rosuvastatin 10 MG tablet Commonly known as: Crestor Take 1 tablet (10 mg total) by mouth daily.         Objective:   BP (!) 138/93    Pulse (!) 105    Ht _0  (1.626 m)    Wt 245 lb (111.1 kg)    SpO2 97%    BMI 42.05 kg/m   Wt Readings from Last 3 Encounters:  01/06/22 245 lb (111.1 kg)  08/19/21 251 lb 12.8 oz (114.2 kg)  05/19/21 238 lb (108 kg)    Physical Exam Vitals and nursing note reviewed.  Constitutional:      General: She is not in acute distress.    Appearance: She is well-developed. She is not diaphoretic.  Eyes:     Conjunctiva/sclera: Conjunctivae normal.  Cardiovascular:     Rate and Rhythm: Normal rate and regular rhythm.     Heart sounds: Normal heart sounds. No murmur heard. Pulmonary:     Effort: Pulmonary effort is normal. No respiratory distress.     Breath sounds: Normal breath sounds. No wheezing.  Musculoskeletal:        General: No swelling or tenderness. Normal range of motion.  Skin:    General: Skin is warm and dry.     Findings: No rash.  Neurological:     Mental Status: She is alert and oriented to person, place, and time.     Coordination: Coordination normal.  Psychiatric:        Behavior: Behavior normal.      Assessment & Plan:   Problem List Items Addressed This Visit       Cardiovascular and Mediastinum   Hypertension     Endocrine   Hypothyroidism (acquired) - Primary   Relevant Orders   Bayer DCA Hb A1c Waived   TSH   Diabetes mellitus, type II, insulin dependent (HCC)   Relevant Orders   Bayer DCA Hb A1c Waived   TSH     Other   Anxiety   Relevant Medications   DULoxetine (CYMBALTA) 60 MG capsule   DULoxetine (CYMBALTA) 30 MG capsule   Depression, recurrent (HCC)   Relevant Medications   DULoxetine  (CYMBALTA) 60 MG capsule   DULoxetine (CYMBALTA) 30 MG capsule   Other Visit Diagnoses     Encounter for screening mammogram for malignant neoplasm of breast       Relevant Orders   MM 3D SCREEN BREAST BILATERAL   Need for shingles vaccine       Relevant Orders   Varicella-zoster vaccine IM (Shingrix) (Completed)       A1c looks good at 6.1, blood pressure slightly elevated but will monitor closely.  She checks at home.   Follow up plan: Return in about 3 months (around 04/05/2022), or if symptoms worsen or fail to improve, for Diabetes and hypertension and anxiety.  Counseling provided for all of the vaccine components Orders Placed This Encounter  Procedures   MM 3D SCREEN BREAST BILATERAL   Varicella-zoster vaccine IM (Shingrix)   Bayer DCA Hb A1c Waived   TSH    Caryl Pina, MD [facility] 01/06/2022, 1:54 PM

## 2022-01-07 LAB — TSH: TSH: 0.041 u[IU]/mL — ABNORMAL LOW (ref 0.450–4.500)

## 2022-01-18 ENCOUNTER — Other Ambulatory Visit: Payer: Self-pay | Admitting: Family Medicine

## 2022-01-18 DIAGNOSIS — Z1231 Encounter for screening mammogram for malignant neoplasm of breast: Secondary | ICD-10-CM

## 2022-02-24 ENCOUNTER — Other Ambulatory Visit: Payer: Self-pay | Admitting: Family Medicine

## 2022-02-24 DIAGNOSIS — F339 Major depressive disorder, recurrent, unspecified: Secondary | ICD-10-CM

## 2022-03-31 ENCOUNTER — Ambulatory Visit: Payer: 59 | Admitting: Family Medicine

## 2022-04-07 IMAGING — MG MM DIGITAL SCREENING BILAT W/ TOMO AND CAD
8 of 14 series · 8 of 40 positions shown · non-contrast
Comparison: None.

ACR Breast Density Category a: The breast tissue is almost entirely
fatty.

CLINICAL DATA: Screening.

EXAM:
DIGITAL SCREENING BILATERAL MAMMOGRAM WITH TOMO AND CAD

[R MLO synth-2D (1 of 2)]
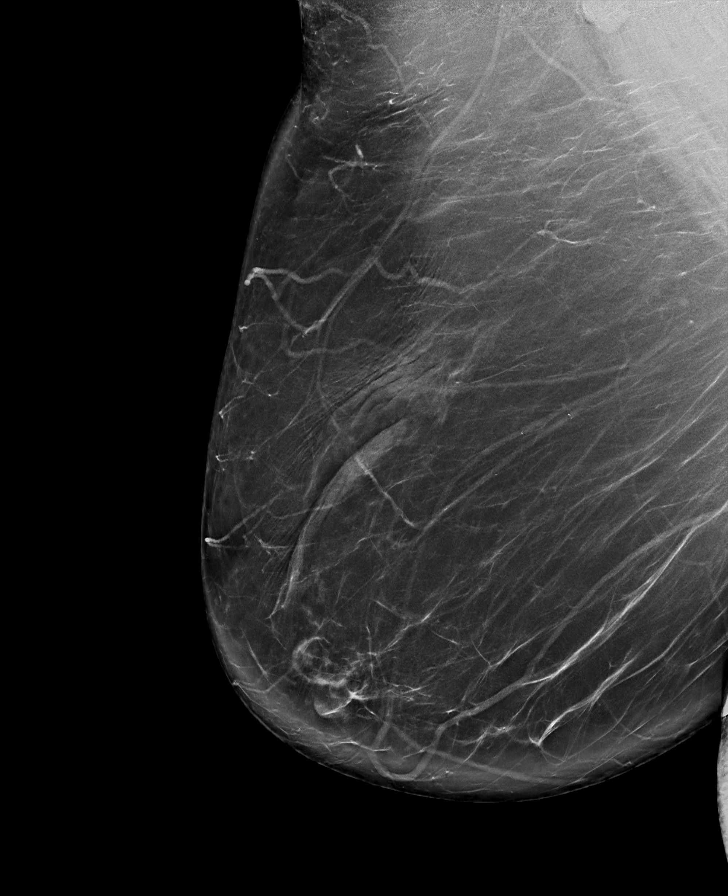

[L MLO synth-2D (1 of 2)]
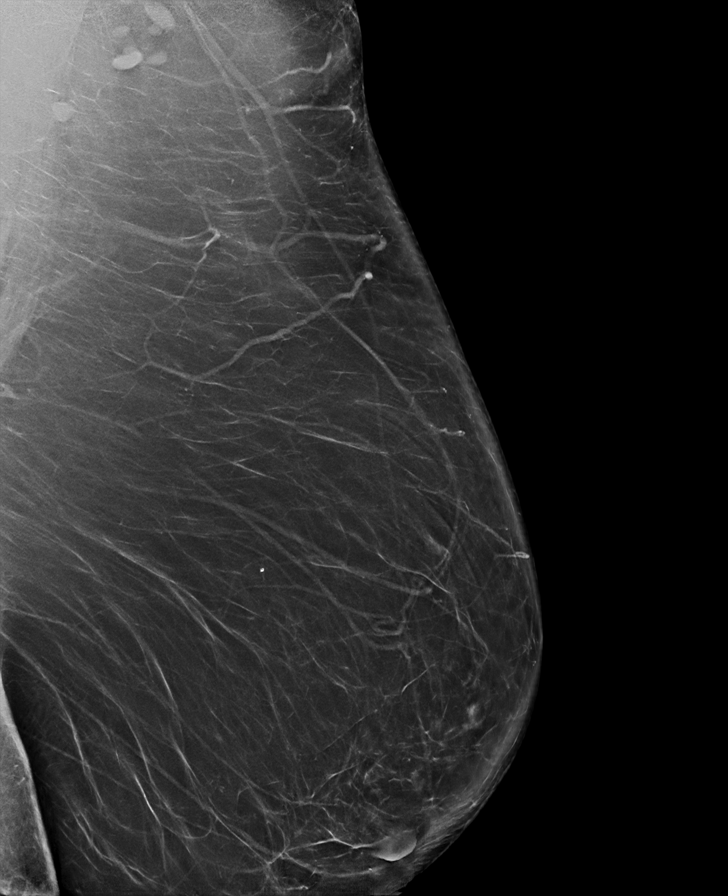

[L CV synth-2D]
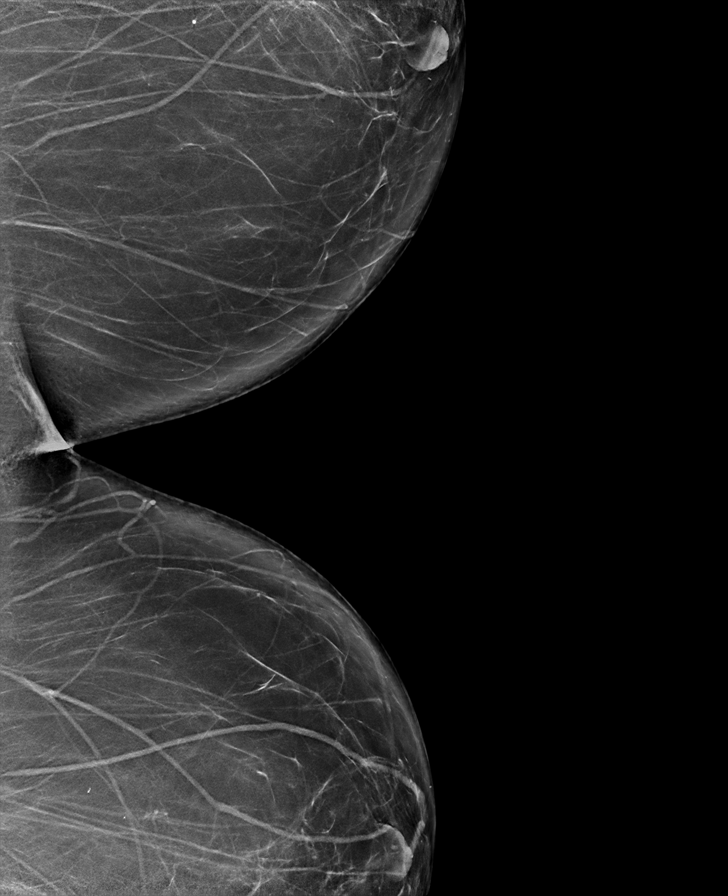

[L CC synth-2D]
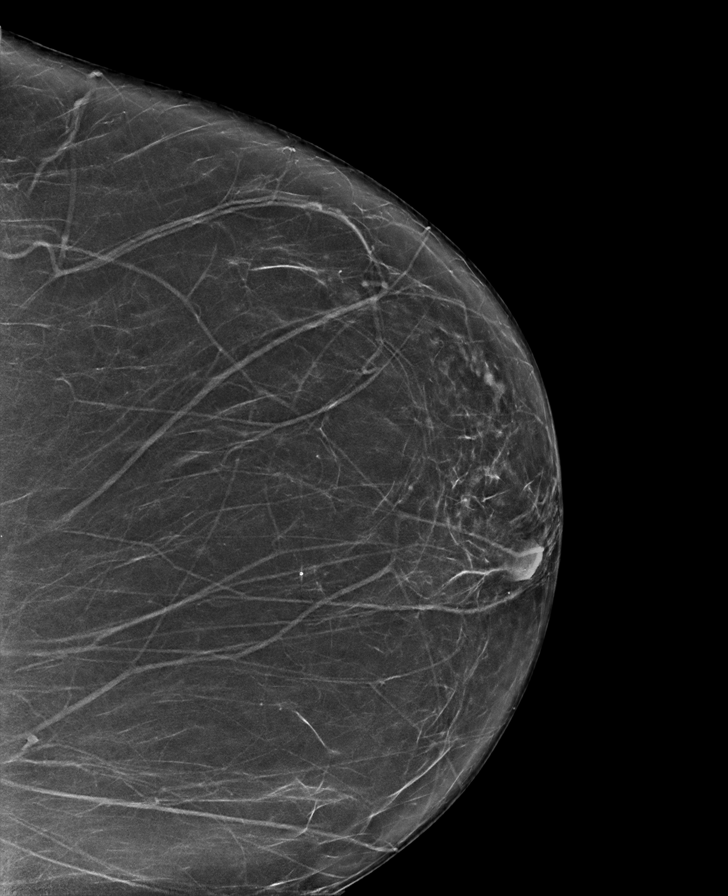

[R CC synth-2D]
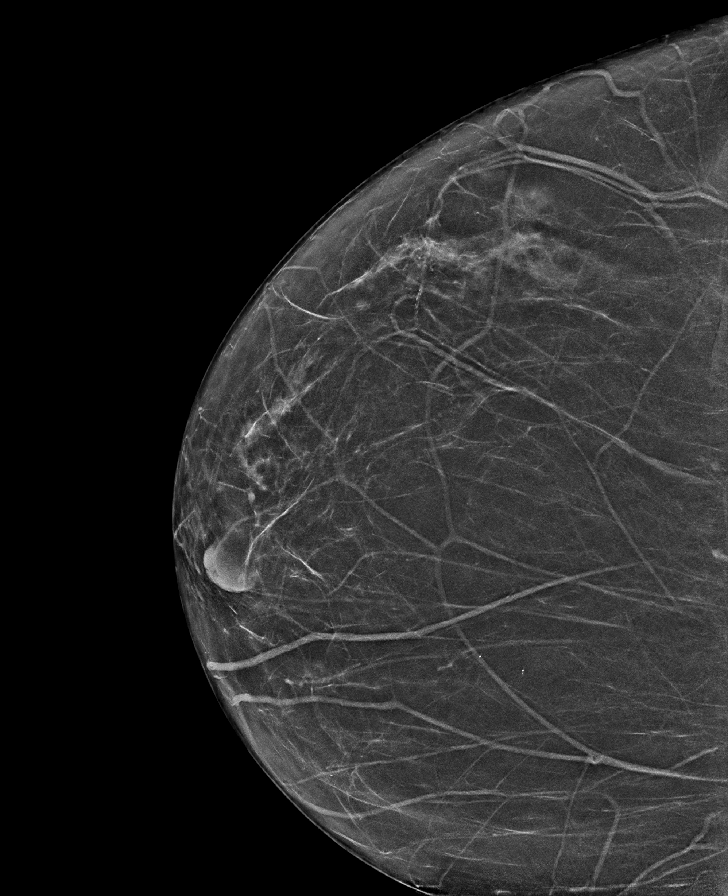

[R MLO synth-2D (2 of 2)]
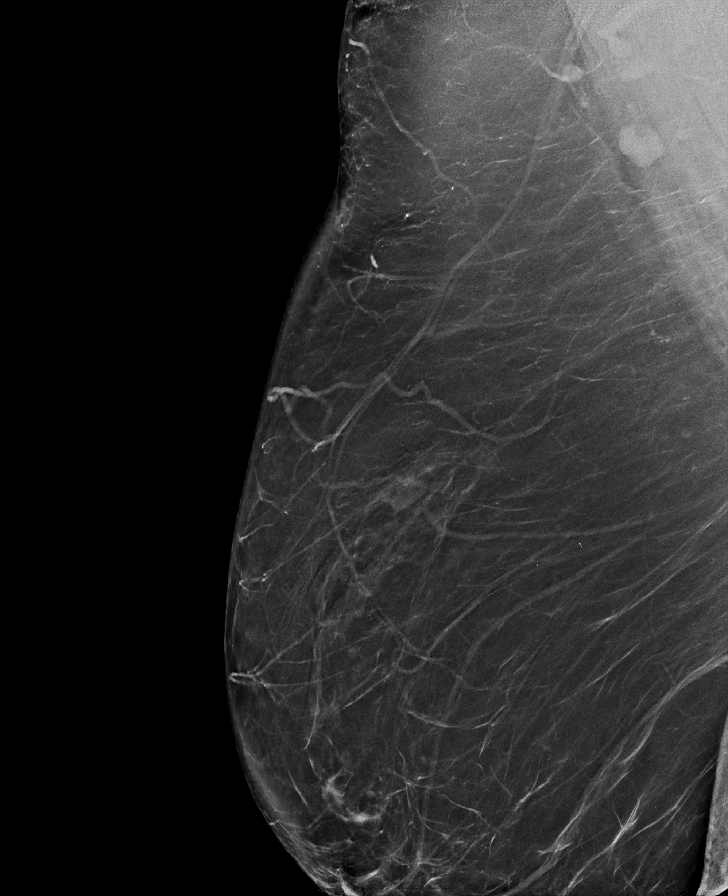

[L MLO synth-2D (2 of 2)]
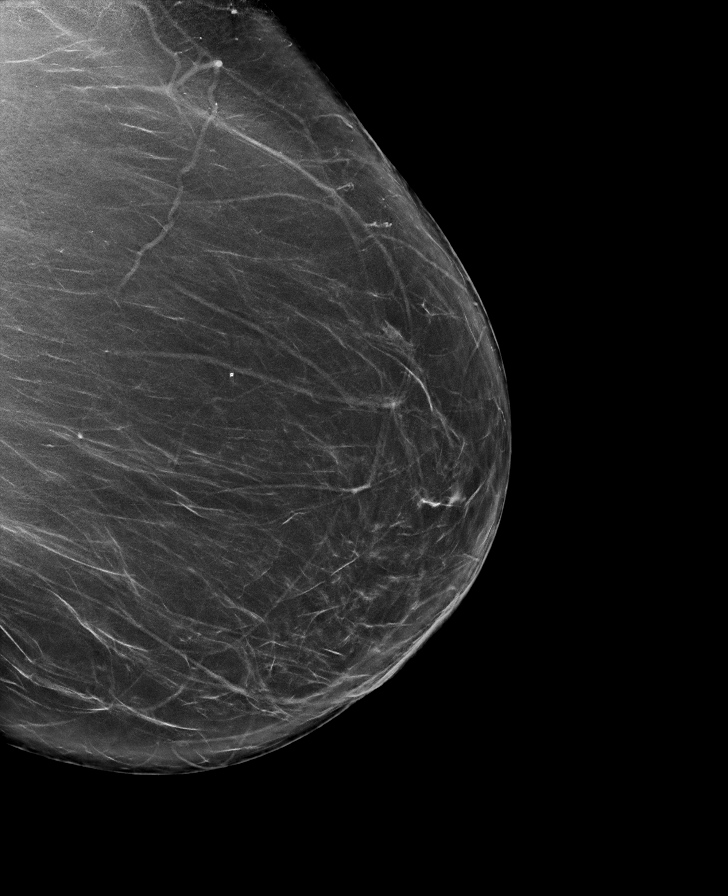

[L MLO tomo · tomo slice 47/93.0]
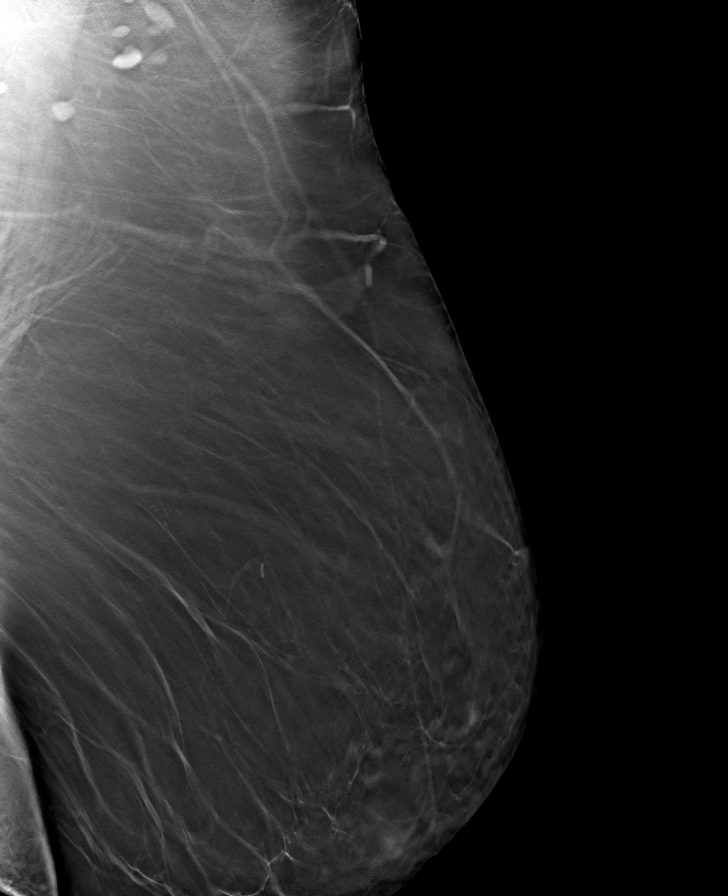

[8 of 40 positions shown; findings below may reference images not displayed]

FINDINGS: There are no findings suspicious for malignancy. The images were
evaluated with computer-aided detection.
IMPRESSION: No mammographic evidence of malignancy. A result letter of this
screening mammogram will be mailed directly to the patient.

RECOMMENDATION:
Screening mammogram in one year. (Code:[id number]-P-05O)

BI-RADS CATEGORY  1: Negative.

## 2022-04-11 ENCOUNTER — Encounter: Payer: Self-pay | Admitting: Family Medicine

## 2022-04-26 ENCOUNTER — Telehealth: Payer: Self-pay | Admitting: Family Medicine

## 2022-04-26 DIAGNOSIS — K219 Gastro-esophageal reflux disease without esophagitis: Secondary | ICD-10-CM

## 2022-04-27 NOTE — Telephone Encounter (Signed)
lansoprazole (PREVACID) 30 MG capsule        Changed from: pantoprazole (PROTONIX) 40 MG tablet   Pharmacy comment: Alternative Requested:INSURANCE WILL NO LONGER COVER THIS FOR THE REST OF THE YEAR, MAX 90 PER 365 DAYS. NEEDS PA AFTER FIRST 90 TABS.   All Pharmacy Suggested Alternatives:   lansoprazole (PREVACID) 30 MG capsule esomeprazole (NEXIUM) 20 MG capsule omeprazole (PRILOSEC) 20 MG capsule RABEprazole (ACIPHEX) 20 MG tablet

## 2022-05-01 ENCOUNTER — Other Ambulatory Visit: Payer: Self-pay | Admitting: Family Medicine

## 2022-05-01 MED ORDER — ESOMEPRAZOLE MAGNESIUM 40 MG PO CPDR: 40.0000 mg | DELAYED_RELEASE_CAPSULE | Freq: Every day | ORAL | 3 refills | Status: DC

## 2022-05-01 NOTE — Addendum Note (Signed)
Addended by: Arville Care on: 05/01/2022 01:03 PM   Modules accepted: Orders

## 2022-05-01 NOTE — Telephone Encounter (Signed)
Noted new prescription to pharmacy

## 2022-05-01 NOTE — Telephone Encounter (Signed)
Sent Nexium for the patient instead because her current heartburn medicine was not covered.

## 2022-05-03 ENCOUNTER — Other Ambulatory Visit: Payer: Self-pay | Admitting: Family Medicine

## 2022-05-03 NOTE — Progress Notes (Unsigned)
Already sent nexium as replacement

## 2022-05-05 ENCOUNTER — Ambulatory Visit: Payer: 59 | Admitting: Family Medicine

## 2022-05-24 ENCOUNTER — Other Ambulatory Visit: Payer: Self-pay | Admitting: Family Medicine

## 2022-05-24 DIAGNOSIS — F339 Major depressive disorder, recurrent, unspecified: Secondary | ICD-10-CM

## 2022-05-27 ENCOUNTER — Other Ambulatory Visit: Payer: Self-pay | Admitting: Family Medicine

## 2022-05-27 DIAGNOSIS — F339 Major depressive disorder, recurrent, unspecified: Secondary | ICD-10-CM

## 2022-06-19 ENCOUNTER — Ambulatory Visit: Payer: 59 | Admitting: Family Medicine

## 2022-07-12 ENCOUNTER — Ambulatory Visit: Payer: 59 | Admitting: Family Medicine

## 2022-07-19 ENCOUNTER — Other Ambulatory Visit: Payer: Self-pay | Admitting: Family Medicine

## 2022-07-19 ENCOUNTER — Telehealth: Payer: Self-pay | Admitting: Family Medicine

## 2022-07-19 DIAGNOSIS — E039 Hypothyroidism, unspecified: Secondary | ICD-10-CM

## 2022-07-19 DIAGNOSIS — I10 Essential (primary) hypertension: Secondary | ICD-10-CM

## 2022-07-19 DIAGNOSIS — E119 Type 2 diabetes mellitus without complications: Secondary | ICD-10-CM

## 2022-07-19 MED ORDER — LISINOPRIL 20 MG PO TABS: 20.0000 mg | ORAL_TABLET | Freq: Every day | ORAL | 0 refills | Status: DC

## 2022-07-19 MED ORDER — ROSUVASTATIN CALCIUM 10 MG PO TABS: 10.0000 mg | ORAL_TABLET | Freq: Every day | ORAL | 0 refills | Status: DC

## 2022-07-19 MED ORDER — BASAGLAR KWIKPEN 100 UNIT/ML ~~LOC~~ SOPN: 20.0000 [IU] | PEN_INJECTOR | Freq: Every day | SUBCUTANEOUS | 0 refills | Status: DC

## 2022-07-19 MED ORDER — DULOXETINE HCL 60 MG PO CPEP: 120.0000 mg | ORAL_CAPSULE | Freq: Every day | ORAL | 0 refills | Status: DC

## 2022-07-19 MED ORDER — ESOMEPRAZOLE MAGNESIUM 40 MG PO CPDR: 40.0000 mg | DELAYED_RELEASE_CAPSULE | Freq: Every day | ORAL | 0 refills | Status: DC

## 2022-07-19 MED ORDER — LEVOTHYROXINE SODIUM 125 MCG PO TABS: 125.0000 ug | ORAL_TABLET | Freq: Every day | ORAL | 0 refills | Status: DC

## 2022-07-19 MED ORDER — DULOXETINE HCL 30 MG PO CPEP: 30.0000 mg | ORAL_CAPSULE | Freq: Every day | ORAL | 0 refills | Status: DC

## 2022-07-19 MED ORDER — INSULIN REGULAR HUMAN 100 UNIT/ML IJ SOLN: 8.0000 [IU] | Freq: Three times a day (TID) | INTRAMUSCULAR | 0 refills | Status: DC

## 2022-07-19 MED ORDER — INSULIN REGULAR HUMAN 100 UNIT/ML IJ SOLN: 8.0000 [IU] | Freq: Three times a day (TID) | INTRAMUSCULAR | 11 refills | Status: DC

## 2022-07-19 MED ORDER — METFORMIN HCL 500 MG PO TABS: 500.0000 mg | ORAL_TABLET | Freq: Two times a day (BID) | ORAL | 0 refills | Status: DC

## 2022-07-19 NOTE — Telephone Encounter (Signed)
Refills sent

## 2022-07-19 NOTE — Telephone Encounter (Signed)
Sent in Humulin R for the patient to see if it would be covered better than Novolin

## 2022-08-01 NOTE — Telephone Encounter (Signed)
Pt asked for her refills to be sent to [facility]  DULoxetine (CYMBALTA) 30 MG capsule DULoxetine (CYMBALTA) 60 MG capsule lisinopril (ZESTRIL) 20 MG tablet levothyroxine (SYNTHROID) 125 MCG tablet rosuvastatin (CRESTOR) 10 MG tablet glucose blood (ACCU-CHEK GUIDE) test strip

## 2022-08-03 MED ORDER — LISINOPRIL 20 MG PO TABS: 20.0000 mg | ORAL_TABLET | Freq: Every day | ORAL | 0 refills | Status: DC

## 2022-08-03 MED ORDER — DULOXETINE HCL 30 MG PO CPEP: 30.0000 mg | ORAL_CAPSULE | Freq: Every day | ORAL | 0 refills | Status: DC

## 2022-08-03 MED ORDER — ROSUVASTATIN CALCIUM 10 MG PO TABS: 10.0000 mg | ORAL_TABLET | Freq: Every day | ORAL | 0 refills | Status: DC

## 2022-08-03 MED ORDER — LEVOTHYROXINE SODIUM 125 MCG PO TABS: 125.0000 ug | ORAL_TABLET | Freq: Every day | ORAL | 0 refills | Status: DC

## 2022-08-03 MED ORDER — ACCU-CHEK GUIDE VI STRP: ORAL_STRIP | 3 refills | Status: DC

## 2022-08-03 MED ORDER — DULOXETINE HCL 60 MG PO CPEP: 120.0000 mg | ORAL_CAPSULE | Freq: Every day | ORAL | 0 refills | Status: DC

## 2022-08-03 NOTE — Telephone Encounter (Signed)
Pt needs ALL of her Rx's sent to [place] in [place]! Pt is out of some of her meds now.   Pt does not use [facility] anymore.

## 2022-08-03 NOTE — Telephone Encounter (Signed)
Pt aware refills sent to [facility]

## 2022-08-03 NOTE — Addendum Note (Signed)
Addended by: Antonietta Barcelona D on: 08/03/2022 03:55 PM   Modules accepted: Orders

## 2022-08-03 NOTE — Telephone Encounter (Signed)
Pt calling about this message again. Please send in rx.

## 2022-08-08 ENCOUNTER — Telehealth: Payer: Self-pay | Admitting: Family Medicine

## 2022-08-08 NOTE — Telephone Encounter (Signed)
  Prescription Request  08/08/2022  Is this a "Controlled Substance" medicine? no  Have you seen your PCP in the last 2 weeks? 9/27 appt   If YES, route message to pool  -  If NO, patient needs to be scheduled for appointment.  What is the name of the medication or equipment? DULoxetine (CYMBALTA) 60 MG capsule  Have you contacted your pharmacy to request a refill? yes   Which pharmacy would you like this sent to? [place], [place]   Patient notified that their request is being sent to the clinical staff for review and that they should receive a response within 2 business days.

## 2022-08-08 NOTE — Telephone Encounter (Signed)
Both doses of Duloxetine were sent in last week, pt picked them up today

## 2022-08-09 ENCOUNTER — Encounter: Payer: Self-pay | Admitting: Family Medicine

## 2022-08-09 ENCOUNTER — Ambulatory Visit: Payer: BC Managed Care – PPO | Admitting: Family Medicine

## 2022-08-09 VITALS — BP 80/56 | HR 98 | Temp 97.4°F | Ht 64.0 in | Wt 230.0 lb

## 2022-08-09 DIAGNOSIS — E039 Hypothyroidism, unspecified: Secondary | ICD-10-CM

## 2022-08-09 DIAGNOSIS — Z23 Encounter for immunization: Secondary | ICD-10-CM | POA: Diagnosis not present

## 2022-08-09 DIAGNOSIS — F339 Major depressive disorder, recurrent, unspecified: Secondary | ICD-10-CM | POA: Diagnosis not present

## 2022-08-09 DIAGNOSIS — I10 Essential (primary) hypertension: Secondary | ICD-10-CM

## 2022-08-09 DIAGNOSIS — E118 Type 2 diabetes mellitus with unspecified complications: Secondary | ICD-10-CM | POA: Diagnosis not present

## 2022-08-09 DIAGNOSIS — F419 Anxiety disorder, unspecified: Secondary | ICD-10-CM

## 2022-08-09 DIAGNOSIS — Z794 Long term (current) use of insulin: Secondary | ICD-10-CM

## 2022-08-09 DIAGNOSIS — E119 Type 2 diabetes mellitus without complications: Secondary | ICD-10-CM

## 2022-08-09 LAB — BAYER DCA HB A1C WAIVED: HB A1C (BAYER DCA - WAIVED): 5.9 % — ABNORMAL HIGH (ref 4.8–5.6)

## 2022-08-09 MED ORDER — METFORMIN HCL 500 MG PO TABS: 500.0000 mg | ORAL_TABLET | Freq: Two times a day (BID) | ORAL | 3 refills | Status: DC

## 2022-08-09 MED ORDER — ESOMEPRAZOLE MAGNESIUM 40 MG PO CPDR: 40.0000 mg | DELAYED_RELEASE_CAPSULE | Freq: Every day | ORAL | 3 refills | Status: DC

## 2022-08-09 MED ORDER — ROSUVASTATIN CALCIUM 10 MG PO TABS: 10.0000 mg | ORAL_TABLET | Freq: Every day | ORAL | 3 refills | Status: DC

## 2022-08-09 MED ORDER — LISINOPRIL 20 MG PO TABS: 20.0000 mg | ORAL_TABLET | Freq: Every day | ORAL | 3 refills | Status: DC

## 2022-08-09 MED ORDER — LEVOTHYROXINE SODIUM 125 MCG PO TABS: 125.0000 ug | ORAL_TABLET | Freq: Every day | ORAL | 3 refills | Status: DC

## 2022-08-09 MED ORDER — DULOXETINE HCL 60 MG PO CPEP: 120.0000 mg | ORAL_CAPSULE | Freq: Every day | ORAL | 3 refills | Status: DC

## 2022-08-09 MED ORDER — DULOXETINE HCL 30 MG PO CPEP: 30.0000 mg | ORAL_CAPSULE | Freq: Every day | ORAL | 3 refills | Status: DC

## 2022-08-09 MED ORDER — BASAGLAR KWIKPEN 100 UNIT/ML ~~LOC~~ SOPN: 20.0000 [IU] | PEN_INJECTOR | Freq: Every day | SUBCUTANEOUS | 3 refills | Status: DC

## 2022-08-09 MED ORDER — OLANZAPINE 15 MG PO TABS: 15.0000 mg | ORAL_TABLET | Freq: Every day | ORAL | 3 refills | Status: DC

## 2022-08-09 MED ORDER — GABAPENTIN 600 MG PO TABS: 600.0000 mg | ORAL_TABLET | Freq: Three times a day (TID) | ORAL | 3 refills | Status: DC

## 2022-08-09 NOTE — Progress Notes (Signed)
BP (!) 80/56   Pulse 98   Temp (!) 97.4 F (36.3 C)   Ht _0  (1.626 m)   Wt 230 lb (104.3 kg)   SpO2 96%   BMI 39.48 kg/m    Subjective:   Patient ID: Tina Chapman, female    DOB: 09/14/1966, 56 y.o.   MRN: [id number]  HPI: [place] Wanner is a 56 y.o. female presenting on 08/09/2022 for Medical Management of Chronic Issues, Hypothyroidism, Hypertension, and Diabetes   HPI Type 2 diabetes mellitus Patient comes in today for recheck of his diabetes. Patient has been currently taking Basaglar 20 and Humalog are and metformin. Patient is currently on an ACE inhibitor/ARB. Patient has seen an ophthalmologist this year. Patient denies any issues with their feet. The symptom started onset as an adult hypertension and hypothyroidism ARE RELATED TO DM   Hypertension Patient is currently on lisinopril, and their blood pressure today is 120/92 and 80/56, she does states she feels little dizzy and admits she is not drinking any fluids at all today yet. Patient denies any lightheadedness or dizziness. Patient denies headaches, blurred vision, chest pains, shortness of breath, or weakness. Denies any side effects from medication and is content with current medication.  Hypothyroidism recheck Patient is coming in for thyroid recheck today as well. They deny any issues with hair changes or heat or cold problems or diarrhea or constipation. They deny any chest pain or palpitations. They are currently on levothyroxine 125 micrograms   Depression and anxiety recheck Patient is coming in for depression anxiety and fibromyalgia and neuropathy recheck.  She currently takes Cymbalta and gabapentin and Zyprexa at night.  She feels like she is doing a lot better and she is sleeping better at night and mood is a lot better.  She is still sad because she is struggling with getting disability and not being able to work but she is in the process of getting it.    08/09/2022    2:34 PM 01/06/2022     1:13 PM 08/19/2021   10:17 AM 05/19/2021    9:57 AM 03/17/2021    1:05 PM  Depression screen PHQ 2/9  Decreased Interest _1 Down, Depressed, Hopeless _2 PHQ - 2 Score _3 Altered sleeping _4 Tired, decreased energy _5 Change in appetite _6 Feeling bad or failure about yourself  _7 Trouble concentrating _8 Moving slowly or fidgety/restless 0 _9 Suicidal thoughts 0 _10 PHQ-9 Score _11 Relevant past medical, surgical, family and social history reviewed and updated as indicated. Interim medical history since our last visit reviewed. Allergies and medications reviewed and updated.  Review of Systems  Constitutional:  Negative for chills and fever.  HENT:  Negative for congestion, ear discharge and ear pain.   Eyes:  Negative for redness and visual disturbance.  Respiratory:  Negative for chest tightness and shortness of breath.   Cardiovascular:  Negative for chest pain and leg swelling.  Genitourinary:  Negative for difficulty urinating and dysuria.  Musculoskeletal:  Negative for back pain and gait problem.  Skin:  Negative for rash.  Neurological:  Negative for light-headedness and headaches.  Psychiatric/Behavioral:  Positive for dysphoric mood. Negative for  agitation, behavioral problems, self-injury, sleep disturbance and suicidal ideas. The patient is nervous/anxious.   All other systems reviewed and are negative.   Per HPI unless specifically indicated above   Allergies as of 08/09/2022   No Known Allergies      Medication List        Accurate as of August 09, 2022  2:41 PM. If you have any questions, ask your nurse or doctor.          Accu-Chek Guide test strip Generic drug: glucose blood Use to check BS 4 times daily   Accu-Chek Guide w/Device Kit 1 each by Does not apply route 2 (two) times daily.   accu-chek multiclix lancets Use to check BS 4 times daily   Basaglar  KwikPen 100 UNIT/ML Inject 20 Units into the skin at bedtime.   DULoxetine 30 MG capsule Commonly known as: Cymbalta Take 1 capsule (30 mg total) by mouth daily. Take with 60 mg tablet for total of 90 mg What changed: Another medication with the same name was changed. Make sure you understand how and when to take each. Changed by: Fransisca Kaufmann Gia Lusher, MD   DULoxetine 60 MG capsule Commonly known as: CYMBALTA Take 2 capsules (120 mg total) by mouth daily. Take a total of 90 mg What changed: additional instructions Changed by: Fransisca Kaufmann Jalayia Bagheri, MD   esomeprazole 40 MG capsule Commonly known as: NexIUM Take 1 capsule (40 mg total) by mouth daily.   fluticasone 50 MCG/ACT nasal spray Commonly known as: FLONASE Place 1 spray into both nostrils 2 (two) times daily as needed for allergies or rhinitis.   FreeStyle Libre 14 Day Reader Kerrin Mo 1 each by Does not apply route 4 (four) times daily.   gabapentin 600 MG tablet Commonly known as: NEURONTIN Take 1 tablet (600 mg total) by mouth 3 (three) times daily.   HM Pain Reliever 325 MG tablet Generic drug: acetaminophen Take 325 mg by mouth every 6 (six) hours as needed for pain.   hydrocortisone 2.5 % rectal cream Commonly known as: ANUSOL-HC Place 1 application rectally 2 (two) times daily. For 10-14 days.   ibuprofen 800 MG tablet Commonly known as: ADVIL Take 800 mg by mouth every 6 (six) hours as needed for pain.   insulin regular 100 units/mL injection Commonly known as: HumuLIN R Inject 0.08 mLs (8 Units total) into the skin 3 (three) times daily before meals.   Insulin Syringe-Needle U-100 31G X 5/16" 0.3 ML Misc Commonly known as: BD Insulin Syringe U/F USE FOUR TIMES DAILY w/ insulin Dx E11.9   levothyroxine 125 MCG tablet Commonly known as: SYNTHROID Take 1 tablet (125 mcg total) by mouth daily before breakfast.   lisinopril 20 MG tablet Commonly known as: ZESTRIL Take 1 tablet (20 mg total) by mouth daily.    meclizine 25 MG tablet Commonly known as: ANTIVERT Take 1 tablet (25 mg total) by mouth 3 (three) times daily as needed for dizziness.   metFORMIN 500 MG tablet Commonly known as: GLUCOPHAGE Take 1 tablet (500 mg total) by mouth 2 (two) times daily with a meal. (Needs to be seen before next refill)   methocarbamol 500 MG tablet Commonly known as: ROBAXIN Take 500 mg by mouth 3 (three) times daily.   OLANZapine 15 MG tablet Commonly known as: ZYPREXA Take 1 tablet (15 mg total) by mouth at bedtime. What changed: See the new instructions. Changed by: Fransisca Kaufmann Rosaelena Kemnitz, MD   polyethylene glycol powder 17 GM/SCOOP powder Commonly  known as: GLYCOLAX/MIRALAX Take 17 g by mouth 2 (two) times daily as needed. What changed: when to take this   rosuvastatin 10 MG tablet Commonly known as: Crestor Take 1 tablet (10 mg total) by mouth daily.         Objective:   BP (!) 80/56   Pulse 98   Temp (!) 97.4 F (36.3 C)   Ht _0  (1.626 m)   Wt 230 lb (104.3 kg)   SpO2 96%   BMI 39.48 kg/m   Wt Readings from Last 3 Encounters:  08/09/22 230 lb (104.3 kg)  01/06/22 245 lb (111.1 kg)  08/19/21 251 lb 12.8 oz (114.2 kg)    Physical Exam Vitals and nursing note reviewed.  Constitutional:      General: She is not in acute distress.    Appearance: She is well-developed. She is not diaphoretic.  Eyes:     Conjunctiva/sclera: Conjunctivae normal.  Cardiovascular:     Rate and Rhythm: Normal rate and regular rhythm.     Heart sounds: Normal heart sounds. No murmur heard. Pulmonary:     Effort: Pulmonary effort is normal. No respiratory distress.     Breath sounds: Normal breath sounds. No wheezing.  Musculoskeletal:        General: No swelling or tenderness. Normal range of motion.  Skin:    General: Skin is warm and dry.     Findings: No rash.  Neurological:     Mental Status: She is alert and oriented to person, place, and time.     Coordination: Coordination normal.   Psychiatric:        Behavior: Behavior normal.       Assessment & Plan:   Problem List Items Addressed This Visit       Cardiovascular and Mediastinum   Hypertension - Primary   Relevant Medications   lisinopril (ZESTRIL) 20 MG tablet   rosuvastatin (CRESTOR) 10 MG tablet   Other Relevant Orders   CBC with Differential/Platelet   CMP14+EGFR   Lipid panel   TSH   Bayer DCA Hb A1c Waived     Endocrine   Hypothyroidism (acquired)   Relevant Medications   levothyroxine (SYNTHROID) 125 MCG tablet   Other Relevant Orders   CBC with Differential/Platelet   CMP14+EGFR   Lipid panel   TSH   Bayer DCA Hb A1c Waived   Diabetes mellitus, type II, insulin dependent (HCC)   Relevant Medications   Insulin Glargine (BASAGLAR KWIKPEN) 100 UNIT/ML   lisinopril (ZESTRIL) 20 MG tablet   metFORMIN (GLUCOPHAGE) 500 MG tablet   rosuvastatin (CRESTOR) 10 MG tablet     Other   Anxiety   Relevant Medications   DULoxetine (CYMBALTA) 30 MG capsule   DULoxetine (CYMBALTA) 60 MG capsule   Depression, recurrent (HCC)   Relevant Medications   DULoxetine (CYMBALTA) 30 MG capsule   DULoxetine (CYMBALTA) 60 MG capsule   OLANZapine (ZYPREXA) 15 MG tablet   Other Visit Diagnoses     Controlled type 2 diabetes mellitus with complication, with long-term current use of insulin (HCC)       Relevant Medications   Insulin Glargine (BASAGLAR KWIKPEN) 100 UNIT/ML   lisinopril (ZESTRIL) 20 MG tablet   metFORMIN (GLUCOPHAGE) 500 MG tablet   rosuvastatin (CRESTOR) 10 MG tablet   Other Relevant Orders   CBC with Differential/Platelet   CMP14+EGFR   Lipid panel   TSH   Bayer DCA Hb A1c Waived       A1c 5.9,  eating better.  Recommended to back off on the [place] down to 16 units daily Follow up plan: Return in about 3 months (around 11/08/2022), or if symptoms worsen or fail to improve, for Diabetes hypertension and thyroid.  Counseling provided for all of the vaccine components Orders  Placed This Encounter  Procedures   CBC with Differential/Platelet   CMP14+EGFR   Lipid panel   TSH   Bayer DCA Hb A1c [place], MD [facility] 08/09/2022, 2:41 PM

## 2022-08-10 LAB — CBC WITH DIFFERENTIAL/PLATELET
Basophils Absolute: 0.1 10*3/uL (ref 0.0–0.2)
Basos: 1 %
EOS (ABSOLUTE): 0 10*3/uL (ref 0.0–0.4)
Eos: 0 %
Hematocrit: 44.7 % (ref 34.0–46.6)
Hemoglobin: 15.1 g/dL (ref 11.1–15.9)
Immature Grans (Abs): 0 10*3/uL (ref 0.0–0.1)
Immature Granulocytes: 0 %
Lymphocytes Absolute: 2.9 10*3/uL (ref 0.7–3.1)
Lymphs: 41 %
MCH: 29.3 pg (ref 26.6–33.0)
MCHC: 33.8 g/dL (ref 31.5–35.7)
MCV: 87 fL (ref 79–97)
Monocytes Absolute: 0.7 10*3/uL (ref 0.1–0.9)
Monocytes: 10 %
Neutrophils Absolute: 3.6 10*3/uL (ref 1.4–7.0)
Neutrophils: 48 %
Platelets: 282 10*3/uL (ref 150–450)
RBC: 5.16 x10E6/uL (ref 3.77–5.28)
RDW: 12.6 % (ref 11.7–15.4)
WBC: 7.3 10*3/uL (ref 3.4–10.8)

## 2022-08-10 LAB — CMP14+EGFR
ALT: 33 IU/L — ABNORMAL HIGH (ref 0–32)
AST: 24 IU/L (ref 0–40)
Albumin/Globulin Ratio: 1.8 (ref 1.2–2.2)
Albumin: 4.6 g/dL (ref 3.8–4.9)
Alkaline Phosphatase: 127 IU/L — ABNORMAL HIGH (ref 44–121)
BUN/Creatinine Ratio: 17 (ref 9–23)
BUN: 10 mg/dL (ref 6–24)
Bilirubin Total: 0.9 mg/dL (ref 0.0–1.2)
CO2: 24 mmol/L (ref 20–29)
Calcium: 10.4 mg/dL — ABNORMAL HIGH (ref 8.7–10.2)
Chloride: 100 mmol/L (ref 96–106)
Creatinine, Ser: 0.58 mg/dL (ref 0.57–1.00)
Globulin, Total: 2.5 g/dL (ref 1.5–4.5)
Glucose: 110 mg/dL — ABNORMAL HIGH (ref 70–99)
Potassium: 4.7 mmol/L (ref 3.5–5.2)
Sodium: 139 mmol/L (ref 134–144)
Total Protein: 7.1 g/dL (ref 6.0–8.5)
eGFR: 107 mL/min/{1.73_m2} (ref >59)

## 2022-08-10 LAB — LIPID PANEL
Chol/HDL Ratio: 2.3 ratio (ref 0.0–4.4)
Cholesterol, Total: 110 mg/dL (ref 100–199)
HDL: 48 mg/dL (ref >39)
LDL Chol Calc (NIH): 40 mg/dL (ref 0–99)
Triglycerides: 123 mg/dL (ref 0–149)
VLDL Cholesterol Cal: 22 mg/dL (ref 5–40)

## 2022-08-10 LAB — TSH: TSH: 0.083 u[IU]/mL — ABNORMAL LOW (ref 0.450–4.500)

## 2022-08-11 ENCOUNTER — Ambulatory Visit: Payer: BC Managed Care – PPO | Admitting: Family Medicine

## 2022-08-14 ENCOUNTER — Telehealth: Payer: Self-pay | Admitting: *Deleted

## 2022-08-14 NOTE — Telephone Encounter (Signed)
TC from [facility] Clarification on Duloxetine 30 mg & 60 mg Last RF on 08/09/22 this is how it was RFd DULoxetine HCl 30 mg Oral Daily, Take with 60 mg tablet for total of 90 mg  120 mg Oral Daily, Take a total of 90 mg DULoxetine (CYMBALTA) 30 MG capsule 90 capsule 1 11/25/2021 01/06/2022  Sig - Route:  Take 1 capsule (30 mg total) by mouth daily. Take with 60 mg tablet for total of 90 mg - Oral  Class:  Normal  DAW:  No  Authorizing Provider:  Worthy Rancher, MD  Ordering User:  Zannie Cove, LPN  DULoxetine (CYMBALTA) 60 MG capsule 180 capsule 1 11/25/2021 01/06/2022  Sig - Route:  Take 2 capsules (120 mg total) by mouth daily. - Oral  Class:  Normal  DAW:  No  Authorizing Provider:  Fransisca Kaufmann Dettinger, MD   Please advise since it has the 'Take a total of 90 mg" written on it which does not add up

## 2022-08-16 NOTE — Telephone Encounter (Signed)
LMTRC,AH,10/4

## 2022-08-16 NOTE — Telephone Encounter (Signed)
Yes there is some confusion on the prescriptions, will have to ask the patient but I believe she is actually taking 120 mg now, not 90 anymore but asked the patient for sure what she is taking.

## 2022-08-17 ENCOUNTER — Other Ambulatory Visit: Payer: Self-pay

## 2022-08-17 DIAGNOSIS — E039 Hypothyroidism, unspecified: Secondary | ICD-10-CM

## 2022-08-17 MED ORDER — LEVOTHYROXINE SODIUM 112 MCG PO TABS: 112.0000 ug | ORAL_TABLET | Freq: Every day | ORAL | 3 refills | Status: DC

## 2022-08-17 NOTE — Telephone Encounter (Signed)
Pt rc about this message 

## 2022-08-22 NOTE — Telephone Encounter (Signed)
Tried calling pt. No answer and no vmail. I believe pt does take both 30mg  daily and 60mg  2 tab daily to equal 150mg  daily.

## 2022-09-14 ENCOUNTER — Telehealth: Payer: Self-pay | Admitting: Family Medicine

## 2022-09-14 MED ORDER — INSULIN REGULAR HUMAN 100 UNIT/ML IJ SOLN: 8.0000 [IU] | Freq: Three times a day (TID) | INTRAMUSCULAR | 3 refills | Status: DC

## 2022-09-14 MED ORDER — ACCU-CHEK MULTICLIX LANCETS MISC: 3 refills | Status: DC

## 2022-09-14 NOTE — Telephone Encounter (Signed)
Refills sent

## 2022-09-15 MED ORDER — ACCU-CHEK SOFTCLIX LANCETS MISC: 3 refills | Status: DC

## 2022-09-15 NOTE — Addendum Note (Signed)
Addended by: Antonietta Barcelona D on: 09/15/2022 11:46 AM   Modules accepted: Orders

## 2022-09-15 NOTE — Telephone Encounter (Signed)
Fax from [facility] no longer available Please send in Accu-Chek Softclic

## 2022-10-02 ENCOUNTER — Telehealth: Payer: Self-pay | Admitting: Family Medicine

## 2022-10-09 ENCOUNTER — Telehealth: Payer: Self-pay | Admitting: Family Medicine

## 2022-10-09 DIAGNOSIS — Z1211 Encounter for screening for malignant neoplasm of colon: Secondary | ICD-10-CM

## 2022-10-09 NOTE — Telephone Encounter (Signed)
Pt would like to go to [place]. Referral placed. Pt will call back within 2 weeks if an appt has not been scheduled.f

## 2022-10-09 NOTE — Telephone Encounter (Signed)
Patient calling because she would like to get a coloscopy set up. Said she spoke to Dr Louanne Skye about this before. Please call back,

## 2022-10-11 ENCOUNTER — Encounter: Payer: Self-pay | Admitting: *Deleted

## 2022-10-12 ENCOUNTER — Telehealth: Payer: Self-pay | Admitting: Family Medicine

## 2022-10-12 DIAGNOSIS — E119 Type 2 diabetes mellitus without complications: Secondary | ICD-10-CM

## 2022-10-12 NOTE — Telephone Encounter (Signed)
  Prescription Request  10/12/2022  Is this a "Controlled Substance" medicine? no  Have you seen your PCP in the last 2 weeks? no  If YES, route message to pool  -  If NO, patient needs to be scheduled for appointment.  What is the name of the medication or equipment? Insulin Syringe-Needle U-100 31G X 5/160.3 ML MISC, and Novolin R insulin- Don't use Humulin R  Have you contacted your pharmacy to request a refill? yes   Which pharmacy would you like this sent to? [facility] in [place]   Patient notified that their request is being sent to the clinical staff for review and that they should receive a response within 2 business days.

## 2022-10-13 DIAGNOSIS — Z419 Encounter for procedure for purposes other than remedying health state, unspecified: Secondary | ICD-10-CM | POA: Diagnosis not present

## 2022-10-13 MED ORDER — "INSULIN SYRINGE-NEEDLE U-100 31G X 5/16"" 0.3 ML MISC": 3 refills | Status: DC

## 2022-10-13 MED ORDER — INSULIN REGULAR HUMAN 100 UNIT/ML IJ SOLN: 8.0000 [IU] | Freq: Three times a day (TID) | INTRAMUSCULAR | 11 refills | Status: DC

## 2022-10-13 NOTE — Telephone Encounter (Signed)
Sent Novolin R for her.

## 2022-10-13 NOTE — Telephone Encounter (Signed)
Syringes sent to pharmacy  Please advise pt requesting Novolin R insulin- Don't use Humulin R

## 2022-10-13 NOTE — Addendum Note (Signed)
Addended by: Arville Care on: 10/13/2022 12:57 PM   Modules accepted: Orders

## 2022-10-13 NOTE — Telephone Encounter (Signed)
Pt aware Novolin R sent into the pharmacy.

## 2022-10-26 ENCOUNTER — Telehealth: Payer: Self-pay | Admitting: Internal Medicine

## 2022-10-26 NOTE — Telephone Encounter (Signed)
We do and I will work on these this afternoon. I will call her and let her know the process

## 2022-10-26 NOTE — Telephone Encounter (Signed)
Patient called and said that she turned in her colonoscopy questionnaire and hasn't heard anything from our office.  I transferred the call to you but then she called back.  Does she need an appointment first?

## 2022-10-26 NOTE — Telephone Encounter (Signed)
Tammy, can you check to see if we have her questionnaire? Thanks!

## 2022-10-26 NOTE — Telephone Encounter (Signed)
Pt informed that she needs an OV due to having diarrhea for several weeks. She was transferred to front desk to make and appt.

## 2022-10-31 ENCOUNTER — Ambulatory Visit
Admission: RE | Admit: 2022-10-31 | Discharge: 2022-10-31 | Disposition: A | Discharge status: Home / Self Care | Payer: Medicaid Other | Source: Ambulatory Visit | Attending: Family Medicine | Admitting: Family Medicine

## 2022-10-31 DIAGNOSIS — Z1231 Encounter for screening mammogram for malignant neoplasm of breast: Secondary | ICD-10-CM

## 2022-11-01 ENCOUNTER — Ambulatory Visit: Payer: BC Managed Care – PPO | Admitting: Family Medicine

## 2022-11-03 ENCOUNTER — Ambulatory Visit (INDEPENDENT_AMBULATORY_CARE_PROVIDER_SITE_OTHER): Payer: Medicaid Other | Admitting: Family Medicine

## 2022-11-03 ENCOUNTER — Encounter: Payer: Self-pay | Admitting: Family Medicine

## 2022-11-03 VITALS — BP 177/105 | HR 68 | Temp 97.3°F | Ht 64.0 in | Wt 233.0 lb

## 2022-11-03 DIAGNOSIS — E119 Type 2 diabetes mellitus without complications: Secondary | ICD-10-CM | POA: Diagnosis not present

## 2022-11-03 DIAGNOSIS — G8929 Other chronic pain: Secondary | ICD-10-CM

## 2022-11-03 DIAGNOSIS — M50121 Cervical disc disorder at C4-C5 level with radiculopathy: Secondary | ICD-10-CM

## 2022-11-03 DIAGNOSIS — E039 Hypothyroidism, unspecified: Secondary | ICD-10-CM | POA: Diagnosis not present

## 2022-11-03 DIAGNOSIS — Z794 Long term (current) use of insulin: Secondary | ICD-10-CM | POA: Diagnosis not present

## 2022-11-03 DIAGNOSIS — I10 Essential (primary) hypertension: Secondary | ICD-10-CM | POA: Diagnosis not present

## 2022-11-03 DIAGNOSIS — Z23 Encounter for immunization: Secondary | ICD-10-CM | POA: Diagnosis not present

## 2022-11-03 DIAGNOSIS — E118 Type 2 diabetes mellitus with unspecified complications: Secondary | ICD-10-CM

## 2022-11-03 DIAGNOSIS — M5442 Lumbago with sciatica, left side: Secondary | ICD-10-CM

## 2022-11-03 LAB — BAYER DCA HB A1C WAIVED: HB A1C (BAYER DCA - WAIVED): 6.5 % — ABNORMAL HIGH (ref 4.8–5.6)

## 2022-11-03 NOTE — Progress Notes (Signed)
BP (!) 177/105   Pulse 68   Temp (!) 97.3 F (36.3 C) (Temporal)   Ht _0  (1.626 m)   Wt 233 lb (105.7 kg)   SpO2 97%   BMI 39.99 kg/m    Subjective:   Patient ID: Tina Chapman, female    DOB: 04-22-1966, 56 y.o.   MRN: [id number]  HPI: Dan Dissinger is a 56 y.o. female presenting on 11/03/2022 for Medical Management of Chronic Issues (3 month follow up) and Referral (Orthopedic surgeon for neck and lower back pain )   HPI Type 2 diabetes mellitus Patient comes in today for recheck of his diabetes. Patient has been currently taking metformin and Basaglar 18 and Novolin 8 units 3 times daily.. Patient is currently on an ACE inhibitor/ARB. Patient has seen an ophthalmologist this year. Patient denies any new issues with their feet. The symptom started onset as an adult hypertension and hypothyroidism ARE RELATED TO DM   Hypertension Patient is currently on lisinopril, and their blood pressure today is 177/105. Patient denies any lightheadedness or dizziness. Patient denies headaches, blurred vision, chest pains, shortness of breath, or weakness. Denies any side effects from medication and is content with current medication.   Hypothyroidism recheck Patient is coming in for thyroid recheck today as well. They deny any issues with hair changes or heat or cold problems or diarrhea or constipation. They deny any chest pain or palpitations. They are currently on levothyroxine 112 micrograms   Patient is having a lot of back pain so that is why she is feeling worse as far as mood but she does feel like the Cymbalta helps with anxiety and depression  Cervical disc disorder of her neck and chronic lumbar pain Patient is coming in complaining of neck and back pain.  She would like a referral to orthopedic for second opinion.  She has a neurosurgeon that she seen before and had MRIs of her neck.  Relevant past medical, surgical, family and social history reviewed and updated as  indicated. Interim medical history since our last visit reviewed. Allergies and medications reviewed and updated.  Review of Systems  Constitutional:  Negative for chills and fever.  HENT:  Negative for congestion, ear discharge and ear pain.   Eyes:  Negative for redness and visual disturbance.  Respiratory:  Negative for chest tightness and shortness of breath.   Cardiovascular:  Negative for chest pain and leg swelling.  Genitourinary:  Negative for difficulty urinating and dysuria.  Musculoskeletal:  Positive for arthralgias, back pain and neck pain. Negative for gait problem.  Skin:  Negative for rash.  Neurological:  Negative for light-headedness and headaches.  Psychiatric/Behavioral:  Negative for agitation and behavioral problems.   All other systems reviewed and are negative.   Per HPI unless specifically indicated above   Allergies as of 11/03/2022   No Known Allergies      Medication List        Accurate as of November 03, 2022  4:14 PM. If you have any questions, ask your nurse or doctor.          Accu-Chek Guide test strip Generic drug: glucose blood Use to check BS 4 times daily   Accu-Chek Guide w/Device Kit 1 each by Does not apply route 2 (two) times daily.   Accu-Chek Softclix Lancets lancets Check BS QID Dx E11.9   Basaglar KwikPen 100 UNIT/ML Inject 20 Units into the skin at bedtime.   DULoxetine 30 MG capsule Commonly known as:  Cymbalta Take 1 capsule (30 mg total) by mouth daily. Take with 60 mg tablet for total of 90 mg   DULoxetine 60 MG capsule Commonly known as: CYMBALTA Take 2 capsules (120 mg total) by mouth daily. Take a total of 90 mg   esomeprazole 40 MG capsule Commonly known as: NexIUM Take 1 capsule (40 mg total) by mouth daily.   fluticasone 50 MCG/ACT nasal spray Commonly known as: FLONASE Place 1 spray into both nostrils 2 (two) times daily as needed for allergies or rhinitis.   FreeStyle Libre 14 Day Reader Kerrin Mo 1  each by Does not apply route 4 (four) times daily.   gabapentin 600 MG tablet Commonly known as: NEURONTIN Take 1 tablet (600 mg total) by mouth 3 (three) times daily.   HM Pain Reliever 325 MG tablet Generic drug: acetaminophen Take 325 mg by mouth every 6 (six) hours as needed for pain.   hydrocortisone 2.5 % rectal cream Commonly known as: ANUSOL-HC Place 1 application rectally 2 (two) times daily. For 10-14 days.   ibuprofen 800 MG tablet Commonly known as: ADVIL Take 800 mg by mouth every 6 (six) hours as needed for pain.   insulin regular 100 units/mL injection Commonly known as: NovoLIN R Inject 0.08 mLs (8 Units total) into the skin 3 (three) times daily before meals.   Insulin Syringe-Needle U-100 31G X 5/16" 0.3 ML Misc Commonly known as: BD Insulin Syringe U/F USE FOUR TIMES DAILY w/ insulin Dx E11.9   levothyroxine 112 MCG tablet Commonly known as: SYNTHROID Take 1 tablet (112 mcg total) by mouth daily.   lisinopril 20 MG tablet Commonly known as: ZESTRIL Take 1 tablet (20 mg total) by mouth daily.   meclizine 25 MG tablet Commonly known as: ANTIVERT Take 1 tablet (25 mg total) by mouth 3 (three) times daily as needed for dizziness.   metFORMIN 500 MG tablet Commonly known as: GLUCOPHAGE Take 1 tablet (500 mg total) by mouth 2 (two) times daily with a meal. (Needs to be seen before next refill)   methocarbamol 500 MG tablet Commonly known as: ROBAXIN Take 500 mg by mouth 3 (three) times daily.   OLANZapine 15 MG tablet Commonly known as: ZYPREXA Take 1 tablet (15 mg total) by mouth at bedtime.   polyethylene glycol powder 17 GM/SCOOP powder Commonly known as: GLYCOLAX/MIRALAX Take 17 g by mouth 2 (two) times daily as needed.   rosuvastatin 10 MG tablet Commonly known as: Crestor Take 1 tablet (10 mg total) by mouth daily.         Objective:   BP (!) 177/105   Pulse 68   Temp (!) 97.3 F (36.3 C) (Temporal)   Ht _0  (1.626 m)   Wt 233  lb (105.7 kg)   SpO2 97%   BMI 39.99 kg/m   Wt Readings from Last 3 Encounters:  11/03/22 233 lb (105.7 kg)  08/09/22 230 lb (104.3 kg)  01/06/22 245 lb (111.1 kg)    Physical Exam Vitals and nursing note reviewed.  Constitutional:      General: She is not in acute distress.    Appearance: She is well-developed. She is not diaphoretic.  Eyes:     Conjunctiva/sclera: Conjunctivae normal.  Cardiovascular:     Rate and Rhythm: Normal rate and regular rhythm.     Heart sounds: Normal heart sounds. No murmur heard. Pulmonary:     Effort: Pulmonary effort is normal. No respiratory distress.     Breath sounds: Normal breath  sounds. No wheezing.  Musculoskeletal:        General: No swelling or tenderness. Normal range of motion.  Skin:    General: Skin is warm and dry.     Findings: No rash.  Neurological:     Mental Status: She is alert and oriented to person, place, and time.     Coordination: Coordination normal.  Psychiatric:        Behavior: Behavior normal.       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  No changes there, did  referral to orthopedic for her.  Blood pressure very elevated today, she says is because her neck and back pain are up so she will monitor closely at home.  Follow up plan: Return in about 3 months (around 02/02/2023), or if symptoms worsen or fail to improve, for Diabetes recheck.  Counseling provided for all of the vaccine components Orders Placed This Encounter  Procedures   Lipid panel   CBC with Differential/Platelet   CMP14+EGFR   Bayer DCA Hb A1c Waived   TSH   Microalbumin / creatinine urine ratio   Ambulatory referral to [place] Cornesha Radziewicz, MD [facility] 11/03/2022, 4:14 PM

## 2022-11-04 LAB — LIPID PANEL
Chol/HDL Ratio: 2.4 ratio (ref 0.0–4.4)
Cholesterol, Total: 120 mg/dL (ref 100–199)
HDL: 50 mg/dL (ref >39)
LDL Chol Calc (NIH): 38 mg/dL (ref 0–99)
Triglycerides: 198 mg/dL — ABNORMAL HIGH (ref 0–149)
VLDL Cholesterol Cal: 32 mg/dL (ref 5–40)

## 2022-11-04 LAB — CBC WITH DIFFERENTIAL/PLATELET
Basophils Absolute: 0.1 10*3/uL (ref 0.0–0.2)
Basos: 1 %
EOS (ABSOLUTE): 0 10*3/uL (ref 0.0–0.4)
Eos: 0 %
Hematocrit: 40 % (ref 34.0–46.6)
Hemoglobin: 13.9 g/dL (ref 11.1–15.9)
Immature Grans (Abs): 0 10*3/uL (ref 0.0–0.1)
Immature Granulocytes: 0 %
Lymphocytes Absolute: 3.1 10*3/uL (ref 0.7–3.1)
Lymphs: 41 %
MCH: 30.3 pg (ref 26.6–33.0)
MCHC: 34.8 g/dL (ref 31.5–35.7)
MCV: 87 fL (ref 79–97)
Monocytes Absolute: 0.6 10*3/uL (ref 0.1–0.9)
Monocytes: 8 %
Neutrophils Absolute: 3.8 10*3/uL (ref 1.4–7.0)
Neutrophils: 50 %
Platelets: 259 10*3/uL (ref 150–450)
RBC: 4.59 x10E6/uL (ref 3.77–5.28)
RDW: 12.3 % (ref 11.7–15.4)
WBC: 7.6 10*3/uL (ref 3.4–10.8)

## 2022-11-04 LAB — CMP14+EGFR
ALT: 32 IU/L (ref 0–32)
AST: 26 IU/L (ref 0–40)
Albumin/Globulin Ratio: 1.8 (ref 1.2–2.2)
Albumin: 4.4 g/dL (ref 3.8–4.9)
Alkaline Phosphatase: 123 IU/L — ABNORMAL HIGH (ref 44–121)
BUN/Creatinine Ratio: 11 (ref 9–23)
BUN: 7 mg/dL (ref 6–24)
Bilirubin Total: 0.7 mg/dL (ref 0.0–1.2)
CO2: 24 mmol/L (ref 20–29)
Calcium: 9.7 mg/dL (ref 8.7–10.2)
Chloride: 102 mmol/L (ref 96–106)
Creatinine, Ser: 0.62 mg/dL (ref 0.57–1.00)
Globulin, Total: 2.5 g/dL (ref 1.5–4.5)
Glucose: 130 mg/dL — ABNORMAL HIGH (ref 70–99)
Potassium: 4.7 mmol/L (ref 3.5–5.2)
Sodium: 142 mmol/L (ref 134–144)
Total Protein: 6.9 g/dL (ref 6.0–8.5)
eGFR: 105 mL/min/{1.73_m2} (ref >59)

## 2022-11-04 LAB — TSH: TSH: 0.104 u[IU]/mL — ABNORMAL LOW (ref 0.450–4.500)

## 2022-11-21 ENCOUNTER — Other Ambulatory Visit: Payer: Self-pay | Admitting: Family Medicine

## 2022-11-21 NOTE — Telephone Encounter (Signed)
  Prescription Request  11/21/2022  Is this a "Controlled Substance" medicine? methocarbamol (ROBAXIN) 500 MG tablet   Have you seen your PCP in the last 2 weeks? no  If YES, route message to pool  -  If NO, patient needs to be scheduled for appointment.  What is the name of the medication or equipment? methocarbamol (ROBAXIN) 500 MG tablet   Have you contacted your pharmacy to request a refill? no   Which pharmacy would you like this sent to? [facility] in [place]  Previous provider who prescribed rx is no longer in network with insurance. Pt can no longer see him   Patient notified that their request is being sent to the clinical staff for review and that they should receive a response within 2 business days.

## 2022-11-22 MED ORDER — METHOCARBAMOL 500 MG PO TABS: 500.0000 mg | ORAL_TABLET | Freq: Three times a day (TID) | ORAL | 2 refills | Status: DC

## 2022-11-22 NOTE — Telephone Encounter (Signed)
Pt aware refill sent to pharmacy 

## 2022-12-05 ENCOUNTER — Ambulatory Visit: Payer: Medicaid Other | Admitting: Gastroenterology

## 2022-12-06 ENCOUNTER — Telehealth: Payer: Self-pay | Admitting: Family Medicine

## 2022-12-06 MED ORDER — ACCU-CHEK GUIDE VI STRP: ORAL_STRIP | 3 refills | Status: DC

## 2022-12-06 NOTE — Addendum Note (Signed)
Addended by: Antonietta Barcelona D on: 12/06/2022 02:01 PM   Modules accepted: Orders

## 2022-12-06 NOTE — Telephone Encounter (Signed)
Refill sent with E11.9 DM, insulin dependent in pharmacy notes to [facility].

## 2022-12-06 NOTE — Telephone Encounter (Signed)
TC from [facility] will only allow testing TID not QID for insulin pt's Resubmitted script for test strips

## 2022-12-16 ENCOUNTER — Encounter: Payer: Self-pay | Admitting: Family Medicine

## 2023-01-03 ENCOUNTER — Ambulatory Visit: Payer: Medicare HMO | Admitting: Gastroenterology

## 2023-01-16 NOTE — Progress Notes (Unsigned)
GI Office Note    Referring Provider: Dettinger, Fransisca Kaufmann, MD Primary Care Physician:  Dettinger, Fransisca Kaufmann, MD  Primary Gastroenterologist:  Chief Complaint   No chief complaint on file.    History of Present Illness   Tina Chapman is a 57 y.o. female presenting today for follow up. Last seen in 11/2020.   Remote EGD/TCS over 10 years ago in [place].   Patient had egd/ed/tcs planned for 12/2020 but she called and cancelled due to back issues at the time.       Medications   Current Outpatient Medications  Medication Sig Dispense Refill   Accu-Chek Softclix Lancets lancets Check BS QID Dx E11.9 400 each 3   Blood Glucose Monitoring Suppl (ACCU-CHEK GUIDE) w/Device KIT 1 each by Does not apply route 2 (two) times daily. 1 kit 0   Continuous Blood Gluc Receiver (FREESTYLE LIBRE 14 DAY READER) DEVI 1 each by Does not apply route 4 (four) times daily. 1 each 1   DULoxetine (CYMBALTA) 30 MG capsule Take 1 capsule (30 mg total) by mouth daily. Take with 60 mg tablet for total of 90 mg 90 capsule 3   DULoxetine (CYMBALTA) 60 MG capsule Take 2 capsules (120 mg total) by mouth daily. Take a total of 90 mg 180 capsule 3   esomeprazole (NEXIUM) 40 MG capsule Take 1 capsule (40 mg total) by mouth daily. 90 capsule 3   fluticasone (FLONASE) 50 MCG/ACT nasal spray Place 1 spray into both nostrils 2 (two) times daily as needed for allergies or rhinitis. 16 g 6   gabapentin (NEURONTIN) 600 MG tablet Take 1 tablet (600 mg total) by mouth 3 (three) times daily. 270 tablet 3   glucose blood (ACCU-CHEK GUIDE) test strip Test BS 3 times daily Dx E11.9 300 each 3   HM PAIN RELIEVER 325 MG tablet Take 325 mg by mouth every 6 (six) hours as needed for pain.     hydrocortisone (ANUSOL-HC) 2.5 % rectal cream Place 1 application rectally 2 (two) times daily. For 10-14 days. 30 g 1   ibuprofen (ADVIL) 800 MG tablet Take 800 mg by mouth every 6 (six) hours as needed for pain.     Insulin  Glargine (BASAGLAR KWIKPEN) 100 UNIT/ML Inject 20 Units into the skin at bedtime. 18 mL 3   insulin regular (NOVOLIN R) 100 units/mL injection Inject 0.08 mLs (8 Units total) into the skin 3 (three) times daily before meals. 10 mL 11   Insulin Syringe-Needle U-100 (BD INSULIN SYRINGE U/F) 31G X 5/16" 0.3 ML MISC USE FOUR TIMES DAILY w/ insulin Dx E11.9 400 each 3   levothyroxine (SYNTHROID) 112 MCG tablet Take 1 tablet (112 mcg total) by mouth daily. 90 tablet 3   lisinopril (ZESTRIL) 20 MG tablet Take 1 tablet (20 mg total) by mouth daily. 90 tablet 3   meclizine (ANTIVERT) 25 MG tablet Take 1 tablet (25 mg total) by mouth 3 (three) times daily as needed for dizziness. (Patient not taking: Reported on 11/03/2022) 30 tablet 1   metFORMIN (GLUCOPHAGE) 500 MG tablet Take 1 tablet (500 mg total) by mouth 2 (two) times daily with a meal. (Needs to be seen before next refill) 180 tablet 3   methocarbamol (ROBAXIN) 500 MG tablet Take 1 tablet (500 mg total) by mouth 3 (three) times daily. 90 tablet 2   OLANZapine (ZYPREXA) 15 MG tablet Take 1 tablet (15 mg total) by mouth at bedtime. 90 tablet 3   polyethylene glycol  powder (GLYCOLAX/MIRALAX) 17 GM/SCOOP powder Take 17 g by mouth 2 (two) times daily as needed. (Patient not taking: Reported on 11/03/2022) 3350 g 1   rosuvastatin (CRESTOR) 10 MG tablet Take 1 tablet (10 mg total) by mouth daily. 90 tablet 3   No current facility-administered medications for this visit.    Allergies   Allergies as of 01/17/2023   (No Known Allergies)    Past Medical History   Past Medical History:  Diagnosis Date   Back pain    Diabetes ([place])    Hyperlipidemia    Hypertension    Hypothyroidism    Sciatic nerve pain     Past Surgical History   Past Surgical History:  Procedure Laterality Date   ABDOMINAL HYSTERECTOMY     APPENDECTOMY     CERVIX SURGERY     CHOLECYSTECTOMY     COLONOSCOPY WITH ESOPHAGOGASTRODUODENOSCOPY (EGD)     in [place]  2002   has metal plate    TONSILLECTOMY      Past Family History   Family History  Problem Relation Age of Onset   Early death Mother        47   Arthritis Mother    Heart disease Mother    Arthritis Father    Diabetes Paternal Uncle    Cancer Maternal Grandmother        colon, age 31   Esophageal cancer Neg Hx    Stomach cancer Neg Hx    Breast cancer Neg Hx     Past Social History   Social History   Socioeconomic History   Marital status: Divorced    Spouse name: Not on file   Number of children: Not on file   Years of education: Not on file   Highest education level: Not on file  Occupational History   Not on file  Tobacco Use   Smoking status: Never   Smokeless tobacco: Never  Vaping Use   Vaping Use: Never used  Substance and Sexual Activity   Alcohol use: Never   Drug use: Never   Sexual activity: Not Currently  Other Topics Concern   Not on file  Social History Narrative   31 son 93 years old, here with her   Social Determinants of Health   Financial Resource Strain: Not on file  Food Insecurity: Not on file  Transportation Needs: Not on file  Physical Activity: Not on file  Stress: Not on file  Social Connections: Not on file  Intimate Partner Violence: Not on file    Review of Systems   General: Negative for anorexia, weight loss, fever, chills, fatigue, weakness. Eyes: Negative for vision changes.  ENT: Negative for hoarseness, difficulty swallowing , nasal congestion. CV: Negative for chest pain, angina, palpitations, dyspnea on exertion, peripheral edema.  Respiratory: Negative for dyspnea at rest, dyspnea on exertion, cough, sputum, wheezing.  GI: See history of present illness. GU:  Negative for dysuria, hematuria, urinary incontinence, urinary frequency, nocturnal urination.  MS: Negative for joint pain, low back pain.  Derm: Negative for rash or itching.  Neuro: Negative for weakness, abnormal sensation, seizure, frequent  headaches, memory loss,  confusion.  Psych: Negative for anxiety, depression, suicidal ideation, hallucinations.  Endo: Negative for unusual weight change.  Heme: Negative for bruising or bleeding. Allergy: Negative for rash or hives.  Physical Exam   There were no vitals taken for this visit.   General: Well-nourished, well-developed in no acute distress.  Head:  Normocephalic, atraumatic.   Eyes: Conjunctiva pink, no icterus. Mouth: Oropharyngeal mucosa moist and pink , no lesions erythema or exudate. Neck: Supple without thyromegaly, masses, or lymphadenopathy.  Lungs: Clear to auscultation bilaterally.  Heart: Regular rate and rhythm, no murmurs rubs or gallops.  Abdomen: Bowel sounds are normal, nontender, nondistended, no hepatosplenomegaly or masses,  no abdominal bruits or hernia, no rebound or guarding.   Rectal: *** Extremities: No lower extremity edema. No clubbing or deformities.  Neuro: Alert and oriented x 4 , grossly normal neurologically.  Skin: Warm and dry, no rash or jaundice.   Psych: Alert and cooperative, normal mood and affect.  Labs   Lab Results  Component Value Date   CREATININE 0.62 11/03/2022   BUN 7 11/03/2022   NA 142 11/03/2022   K 4.7 11/03/2022   CL 102 11/03/2022   CO2 24 11/03/2022   Lab Results  Component Value Date   WBC 7.6 11/03/2022   HGB 13.9 11/03/2022   HCT 40.0 11/03/2022   MCV 87 11/03/2022   PLT 259 11/03/2022   Lab Results  Component Value Date   ALT 32 11/03/2022   AST 26 11/03/2022   GGT 19 02/20/2020   ALKPHOS 123 (H) 11/03/2022   BILITOT 0.7 11/03/2022   Lab Results  Component Value Date   HGBA1C 6.5 (H) 11/03/2022   Lab Results  Component Value Date   TSH 0.104 (L) 11/03/2022    Imaging Studies   No results found.  Assessment       PLAN   ***   Laureen Ochs. Bobby Rumpf, [place], [facility]

## 2023-01-16 NOTE — H&P (View-Only) (Signed)
GI Office Note    Referring Provider: Dettinger, Fransisca Kaufmann, MD Primary Care Physician:  Dettinger, Fransisca Kaufmann, MD  Primary Gastroenterologist: Elon Alas. Abbey Chatters, DO   Chief Complaint   Chief Complaint  Patient presents with   Colonoscopy    Has issues with diarrhea and reflux.      History of Present Illness   Tina Chapman is a 57 y.o. female presenting today for follow up. Last seen in 11/2020. Remote EGD/TCS over 10 years ago in [place]. Denies history of colon polyps. Patient had egd/ed/tcs planned for 12/2020 but she called and cancelled due to back issues at the time.   She reports BM every 2-3 days. No hard stools. When she finally has BM, stools are bristol 6-7. Usually couple of stools. No melena, brbpr. Has acid reflux every day. Sometimes burning up to her throat. Complains of solid food and pill dysphagia. Used to be on Nexium 40mg  daily but no active RX at pharmacy. She has been using TUMS, OTC Nexium with some relief. She avoids certain foods (tomato based) due to epigastric pain/GERD. Denies ASA/NSAID use.   Medications   Current Outpatient Medications  Medication Sig Dispense Refill   Accu-Chek Softclix Lancets lancets Check BS QID Dx E11.9 400 each 3   Blood Glucose Monitoring Suppl (ACCU-CHEK GUIDE) w/Device KIT 1 each by Does not apply route 2 (two) times daily. 1 kit 0   Continuous Blood Gluc Receiver (FREESTYLE LIBRE 14 DAY READER) DEVI 1 each by Does not apply route 4 (four) times daily. 1 each 1   DULoxetine (CYMBALTA) 30 MG capsule Take 1 capsule (30 mg total) by mouth daily. Take with 60 mg tablet for total of 90 mg 90 capsule 3   DULoxetine (CYMBALTA) 60 MG capsule Take 2 capsules (120 mg total) by mouth daily. Take a total of 90 mg 180 capsule 3   fluticasone (FLONASE) 50 MCG/ACT nasal spray Place 1 spray into both nostrils 2 (two) times daily as needed for allergies or rhinitis. 16 g 6   gabapentin (NEURONTIN) 600 MG tablet Take 1 tablet (600 mg  total) by mouth 3 (three) times daily. 270 tablet 3   glucose blood (ACCU-CHEK GUIDE) test strip Test BS 3 times daily Dx E11.9 300 each 3   Insulin Glargine (BASAGLAR KWIKPEN) 100 UNIT/ML Inject 20 Units into the skin at bedtime. 18 mL 3   insulin regular (NOVOLIN R) 100 units/mL injection Inject 0.08 mLs (8 Units total) into the skin 3 (three) times daily before meals. 10 mL 11   Insulin Syringe-Needle U-100 (BD INSULIN SYRINGE U/F) 31G X 5/16" 0.3 ML MISC USE FOUR TIMES DAILY w/ insulin Dx E11.9 400 each 3   levothyroxine (SYNTHROID) 112 MCG tablet Take 1 tablet (112 mcg total) by mouth daily. 90 tablet 3   lisinopril (ZESTRIL) 20 MG tablet Take 1 tablet (20 mg total) by mouth daily. 90 tablet 3   metFORMIN (GLUCOPHAGE) 500 MG tablet Take 1 tablet (500 mg total) by mouth 2 (two) times daily with a meal. (Needs to be seen before next refill) 180 tablet 3   methocarbamol (ROBAXIN) 500 MG tablet Take 1 tablet (500 mg total) by mouth 3 (three) times daily. 90 tablet 2   Multiple Vitamin (MULTIVITAMIN) tablet Take 1 tablet by mouth daily.     OLANZapine (ZYPREXA) 15 MG tablet Take 1 tablet (15 mg total) by mouth at bedtime. 90 tablet 3   rosuvastatin (CRESTOR) 10 MG tablet Take 1 tablet (  10 mg total) by mouth daily. 90 tablet 3   No current facility-administered medications for this visit.    Allergies   Allergies as of 01/17/2023   (No Known Allergies)    Past Medical History   Past Medical History:  Diagnosis Date   Back pain    Diabetes ([place])    Hyperlipidemia    Hypertension    Hypothyroidism    Sciatic nerve pain     Past Surgical History   Past Surgical History:  Procedure Laterality Date   ABDOMINAL HYSTERECTOMY     APPENDECTOMY     CERVIX SURGERY     CHOLECYSTECTOMY     COLONOSCOPY WITH ESOPHAGOGASTRODUODENOSCOPY (EGD)     in [place]  2002   has metal plate    TONSILLECTOMY      Past Family History   Family History  Problem Relation Age of  Onset   Early death Mother        39   Arthritis Mother    Heart disease Mother    Arthritis Father    Diabetes Paternal Uncle    Cancer Maternal Grandmother        colon, age 50   Esophageal cancer Neg Hx    Stomach cancer Neg Hx    Breast cancer Neg Hx     Past Social History   Social History   Socioeconomic History   Marital status: Divorced    Spouse name: Not on file   Number of children: Not on file   Years of education: Not on file   Highest education level: Not on file  Occupational History   Not on file  Tobacco Use   Smoking status: Never   Smokeless tobacco: Never  Vaping Use   Vaping Use: Never used  Substance and Sexual Activity   Alcohol use: Never   Drug use: Never   Sexual activity: Not Currently  Other Topics Concern   Not on file  Social History Narrative   30 son 60 years old, here with her   Social Determinants of Health   Financial Resource Strain: Not on file  Food Insecurity: Not on file  Transportation Needs: Not on file  Physical Activity: Not on file  Stress: Not on file  Social Connections: Not on file  Intimate Partner Violence: Not on file    Review of Systems   General: Negative for anorexia, weight loss, fever, chills, fatigue, weakness. Eyes: Negative for vision changes.  ENT: Negative for hoarseness, difficulty swallowing , nasal congestion. CV: Negative for chest pain, angina, palpitations, dyspnea on exertion, peripheral edema.  Respiratory: Negative for dyspnea at rest, dyspnea on exertion, cough, sputum, wheezing.  GI: See history of present illness. GU:  Negative for dysuria, hematuria, urinary incontinence, urinary frequency, nocturnal urination.  MS: Negative for joint pain,+ low back pain.  Derm: Negative for rash or itching.  Neuro: Negative for weakness, abnormal sensation, seizure, frequent headaches, memory loss,  confusion.  Psych: Negative for anxiety, depression, suicidal ideation, hallucinations.  Endo:  Negative for unusual weight change.  Heme: Negative for bruising or bleeding. Allergy: Negative for rash or hives.  Physical Exam   BP 105/73 (BP Location: Right Arm, Patient Position: Sitting, Cuff Size: Large)   Pulse (!) 105   Temp 98.4 F (36.9 C) (Oral)   Ht 5\' 4"  (1.626 m)   Wt 246 lb 6.4 oz (111.8 kg)   SpO2 94%   BMI 42.29 kg/m    General:  Well-nourished, well-developed in no acute distress.  Head: Normocephalic, atraumatic.   Eyes: Conjunctiva pink, no icterus. Mouth: Oropharyngeal mucosa moist and pink  Neck: Supple without thyromegaly, masses, or lymphadenopathy.  Lungs: Clear to auscultation bilaterally.  Heart: Regular rate and rhythm, no murmurs rubs or gallops.  Abdomen: Bowel sounds are normal,  nondistended, no hepatosplenomegaly or masses,  no abdominal bruits or hernia, no rebound or guarding.  Mild ruq tenderness to deep palpation Rectal: not performed Extremities: No lower extremity edema. No clubbing or deformities.  Neuro: Alert and oriented x 4 , grossly normal neurologically.  Skin: Warm and dry, no rash or jaundice.   Psych: Alert and cooperative, normal mood and affect.  Labs   Lab Results  Component Value Date   CREATININE 0.62 11/03/2022   BUN 7 11/03/2022   NA 142 11/03/2022   K 4.7 11/03/2022   CL 102 11/03/2022   CO2 24 11/03/2022   Lab Results  Component Value Date   WBC 7.6 11/03/2022   HGB 13.9 11/03/2022   HCT 40.0 11/03/2022   MCV 87 11/03/2022   PLT 259 11/03/2022   Lab Results  Component Value Date   ALT 32 11/03/2022   AST 26 11/03/2022   GGT 19 02/20/2020   ALKPHOS 123 (H) 11/03/2022   BILITOT 0.7 11/03/2022   Lab Results  Component Value Date   HGBA1C 6.5 (H) 11/03/2022   Lab Results  Component Value Date   TSH 0.104 (L) 11/03/2022    Imaging Studies   No results found.  Assessment   GERD/dysphagia: recurrent heartburn off PPI. Minimal improvement wih over the counter strength Nexium. Complains of solid  food/pill dysphagia which could be secondary to poorly controlled GERD but may have esophageal stricture.   RUQ tenderness: mild tenderness noted on exam. She denies postprandial ruq pain. She is s/p cholecystectomy. She has intermittent elevations of LFTs. Await EGD findings, update labs. May need abdominal u/s in near future.   Screening colonoscopy: due for colonoscopy   PLAN   Update labs.  EGD/ED/colonoscopy with Dr. Abbey Chatters. ASA 3.  I have discussed the risks, alternatives, benefits with regards to but not limited to the risk of reaction to medication, bleeding, infection, perforation and the patient is agreeable to proceed. Written consent to be obtained. Start Nexium 40mg  daily.    Laureen Ochs. Bobby Rumpf, [place], [facility]

## 2023-01-17 ENCOUNTER — Encounter: Payer: Self-pay | Admitting: Gastroenterology

## 2023-01-17 ENCOUNTER — Ambulatory Visit (INDEPENDENT_AMBULATORY_CARE_PROVIDER_SITE_OTHER): Payer: Medicare HMO | Admitting: Gastroenterology

## 2023-01-17 ENCOUNTER — Telehealth: Payer: Self-pay | Admitting: Gastroenterology

## 2023-01-17 VITALS — BP 105/73 | HR 105 | Temp 98.4°F | Ht 64.0 in | Wt 246.4 lb

## 2023-01-17 DIAGNOSIS — R1011 Right upper quadrant pain: Secondary | ICD-10-CM | POA: Diagnosis not present

## 2023-01-17 DIAGNOSIS — R1319 Other dysphagia: Secondary | ICD-10-CM | POA: Diagnosis not present

## 2023-01-17 DIAGNOSIS — Z1211 Encounter for screening for malignant neoplasm of colon: Secondary | ICD-10-CM

## 2023-01-17 DIAGNOSIS — R7989 Other specified abnormal findings of blood chemistry: Secondary | ICD-10-CM

## 2023-01-17 DIAGNOSIS — K219 Gastro-esophageal reflux disease without esophagitis: Secondary | ICD-10-CM

## 2023-01-17 DIAGNOSIS — R1310 Dysphagia, unspecified: Secondary | ICD-10-CM | POA: Insufficient documentation

## 2023-01-17 MED ORDER — ESOMEPRAZOLE MAGNESIUM 40 MG PO CPDR: 40.0000 mg | DELAYED_RELEASE_CAPSULE | Freq: Every day | ORAL | 3 refills | Status: DC

## 2023-01-17 NOTE — Patient Instructions (Addendum)
Colonoscopy and upper endoscopy to be scheduled.  Restart Nexium '40mg'$  daily before breakfast. RX sent to [facility].

## 2023-01-17 NOTE — Telephone Encounter (Signed)
Please let pt know that I reviewed additional labs after she was in the office. She has intermittent elevated lfts. Recommend some additional labs.   Orders placed, you may have to release

## 2023-01-18 ENCOUNTER — Telehealth: Payer: Self-pay | Admitting: *Deleted

## 2023-01-18 DIAGNOSIS — R7989 Other specified abnormal findings of blood chemistry: Secondary | ICD-10-CM | POA: Diagnosis not present

## 2023-01-18 NOTE — Telephone Encounter (Signed)
Pt was made aware and verbalized understanding.  

## 2023-01-18 NOTE — Telephone Encounter (Signed)
[facility]  TCS/EGD/ED w/ Dr.Carver, ASA 3

## 2023-01-21 LAB — HEPATIC FUNCTION PANEL
ALT: 32 IU/L (ref 0–32)
AST: 28 IU/L (ref 0–40)
Albumin: 4.2 g/dL (ref 3.8–4.9)
Alkaline Phosphatase: 148 IU/L — ABNORMAL HIGH (ref 44–121)
Bilirubin Total: 0.6 mg/dL (ref 0.0–1.2)
Bilirubin, Direct: 0.16 mg/dL (ref 0.00–0.40)
Total Protein: 6.8 g/dL (ref 6.0–8.5)

## 2023-01-21 LAB — IRON,TIBC AND FERRITIN PANEL
Ferritin: 146 ng/mL (ref 15–150)
Iron Saturation: 26 % (ref 15–55)
Iron: 74 ug/dL (ref 27–159)
Total Iron Binding Capacity: 286 ug/dL (ref 250–450)
UIBC: 212 ug/dL (ref 131–425)

## 2023-01-21 LAB — ANA: Anti Nuclear Antibody (ANA): NEGATIVE

## 2023-01-21 LAB — HEPATITIS C ANTIBODY: Hep C Virus Ab: NONREACTIVE

## 2023-01-21 LAB — MITOCHONDRIAL/SMOOTH MUSCLE AB PNL
Mitochondrial Ab: <20 Units (ref 0.0–20.0)
Smooth Muscle Ab: 7 Units (ref 0–19)

## 2023-01-21 LAB — HEPATITIS B SURFACE ANTIGEN: Hepatitis B Surface Ag: NEGATIVE

## 2023-01-21 LAB — IGG, IGA, IGM
IgA/Immunoglobulin A, Serum: 283 mg/dL (ref 87–352)
IgG (Immunoglobin G), Serum: 1118 mg/dL (ref 586–1602)
IgM (Immunoglobulin M), Srm: 38 mg/dL (ref 26–217)

## 2023-01-21 LAB — TISSUE TRANSGLUTAMINASE, IGA: Transglutaminase IgA: <2 U/mL (ref 0–3)

## 2023-01-24 ENCOUNTER — Encounter: Payer: Self-pay | Admitting: *Deleted

## 2023-01-24 ENCOUNTER — Other Ambulatory Visit: Payer: Self-pay | Admitting: *Deleted

## 2023-01-24 MED ORDER — PEG 3350-KCL-NA BICARB-NACL 420 G PO SOLR: 4000.0000 mL | Freq: Once | ORAL | 0 refills | Status: AC

## 2023-01-24 NOTE — Telephone Encounter (Signed)
Pt has been scheduled for 02/12/23. Instructions sent via MyChart and prep sent to the pharmacy.  [facility] PA: Approved Authorization [id number]  Tracking [id number] DOS: 02/12/23-05/11/23

## 2023-01-26 ENCOUNTER — Encounter: Payer: Self-pay | Admitting: *Deleted

## 2023-01-26 ENCOUNTER — Other Ambulatory Visit: Payer: Self-pay

## 2023-01-26 DIAGNOSIS — R7989 Other specified abnormal findings of blood chemistry: Secondary | ICD-10-CM

## 2023-02-01 ENCOUNTER — Encounter: Payer: Self-pay | Admitting: Family Medicine

## 2023-02-01 ENCOUNTER — Ambulatory Visit: Payer: Medicare HMO | Admitting: Family Medicine

## 2023-02-01 VITALS — BP 134/89 | HR 102 | Ht 64.0 in | Wt 247.0 lb

## 2023-02-01 DIAGNOSIS — E039 Hypothyroidism, unspecified: Secondary | ICD-10-CM

## 2023-02-01 DIAGNOSIS — E119 Type 2 diabetes mellitus without complications: Secondary | ICD-10-CM

## 2023-02-01 DIAGNOSIS — Z794 Long term (current) use of insulin: Secondary | ICD-10-CM

## 2023-02-01 DIAGNOSIS — E1159 Type 2 diabetes mellitus with other circulatory complications: Secondary | ICD-10-CM | POA: Diagnosis not present

## 2023-02-01 DIAGNOSIS — Z23 Encounter for immunization: Secondary | ICD-10-CM | POA: Diagnosis not present

## 2023-02-01 DIAGNOSIS — I152 Hypertension secondary to endocrine disorders: Secondary | ICD-10-CM | POA: Diagnosis not present

## 2023-02-01 DIAGNOSIS — Z7984 Long term (current) use of oral hypoglycemic drugs: Secondary | ICD-10-CM | POA: Diagnosis not present

## 2023-02-01 DIAGNOSIS — I10 Essential (primary) hypertension: Secondary | ICD-10-CM

## 2023-02-01 DIAGNOSIS — E1169 Type 2 diabetes mellitus with other specified complication: Secondary | ICD-10-CM | POA: Diagnosis not present

## 2023-02-01 DIAGNOSIS — Z114 Encounter for screening for human immunodeficiency virus [HIV]: Secondary | ICD-10-CM | POA: Diagnosis not present

## 2023-02-01 DIAGNOSIS — F339 Major depressive disorder, recurrent, unspecified: Secondary | ICD-10-CM

## 2023-02-01 LAB — BAYER DCA HB A1C WAIVED: HB A1C (BAYER DCA - WAIVED): 7 % — ABNORMAL HIGH (ref 4.8–5.6)

## 2023-02-01 MED ORDER — DULOXETINE HCL 60 MG PO CPEP: 120.0000 mg | ORAL_CAPSULE | Freq: Every day | ORAL | 3 refills | Status: DC

## 2023-02-01 NOTE — Addendum Note (Signed)
Addended by: Alphonzo Dublin on: 02/01/2023 04:34 PM   Modules accepted: Orders

## 2023-02-01 NOTE — Progress Notes (Signed)
BP 134/89   Pulse (!) 102   Ht 5\' 4"  (1.626 m)   Wt 247 lb (112 kg)   SpO2 97%   BMI 42.40 kg/m    Subjective:   Patient ID: Tina Chapman, female    DOB: 11-19-1965, 57 y.o.   MRN: [id number]  HPI: Tina Chapman is a 57 y.o. female presenting on 02/01/2023 for Medical Management of Chronic Issues, Hypothyroidism, and Diabetes   HPI Type 2 diabetes mellitus Patient comes in today for recheck of his diabetes. Patient has been currently taking Basaglar and Novolin R and metformin. Patient is currently on an ACE inhibitor/ARB. Patient has not seen an ophthalmologist this year. Patient denies any issues with their feet. The symptom started onset as an adult hypertension and hypothyroidism ARE RELATED TO DM   Hypertension Patient is currently on lisinopril, and their blood pressure today is 134/89. Patient denies any lightheadedness or dizziness. Patient denies headaches, blurred vision, chest pains, shortness of breath, or weakness. Denies any side effects from medication and is content with current medication.   Hypothyroidism recheck Patient is coming in for thyroid recheck today as well. They deny any issues with hair changes or heat or cold problems or diarrhea or constipation. They deny any chest pain or palpitations. They are currently on levothyroxine 112 micrograms   Depression and anxiety recheck Patient is coming in today for depression and anxiety recheck.  She says she is doing okay with depression and anxiety but she is having a lot of more myalgias and aches and pains and neuropathy and nerve pain we discussed increasing the Cymbalta because of those aches and pains and she is also going to see her spinal doctor.  Relevant past medical, surgical, family and social history reviewed and updated as indicated. Interim medical history since our last visit reviewed. Allergies and medications reviewed and updated.  Review of Systems  Constitutional:  Negative for chills  and fever.  Eyes:  Negative for redness and visual disturbance.  Respiratory:  Negative for chest tightness and shortness of breath.   Cardiovascular:  Negative for chest pain and leg swelling.  Musculoskeletal:  Negative for back pain and gait problem.  Skin:  Negative for rash.  Neurological:  Negative for dizziness, light-headedness and headaches.  Psychiatric/Behavioral:  Positive for dysphoric mood and sleep disturbance. Negative for agitation, behavioral problems, self-injury and suicidal ideas. The patient is nervous/anxious.   All other systems reviewed and are negative.   Per HPI unless specifically indicated above   Allergies as of 02/01/2023   No Known Allergies      Medication List        Accurate as of February 01, 2023 11:33 AM. If you have any questions, ask your nurse or doctor.          Accu-Chek Guide test strip Generic drug: glucose blood Test BS 3 times daily Dx E11.9   Accu-Chek Guide w/Device Kit 1 each by Does not apply route 2 (two) times daily.   Accu-Chek Softclix Lancets lancets Check BS QID Dx E11.9   Basaglar KwikPen 100 UNIT/ML Inject 20 Units into the skin at bedtime.   DULoxetine 60 MG capsule Commonly known as: CYMBALTA Take 2 capsules (120 mg total) by mouth daily. Take a total of 90 mg What changed: Another medication with the same name was removed. Continue taking this medication, and follow the directions you see here. Changed by: Fransisca Kaufmann Debra Colon, MD   esomeprazole 40 MG capsule Commonly known as: NexIUM  Take 1 capsule (40 mg total) by mouth daily before breakfast.   fluticasone 50 MCG/ACT nasal spray Commonly known as: FLONASE Place 1 spray into both nostrils 2 (two) times daily as needed for allergies or rhinitis.   FreeStyle Libre 14 Day Reader Kerrin Mo 1 each by Does not apply route 4 (four) times daily.   gabapentin 600 MG tablet Commonly known as: NEURONTIN Take 1 tablet (600 mg total) by mouth 3 (three) times daily.    insulin regular 100 units/mL injection Commonly known as: NovoLIN R Inject 0.08 mLs (8 Units total) into the skin 3 (three) times daily before meals.   Insulin Syringe-Needle U-100 31G X 5/16" 0.3 ML Misc Commonly known as: BD Insulin Syringe U/F USE FOUR TIMES DAILY w/ insulin Dx E11.9   levothyroxine 112 MCG tablet Commonly known as: SYNTHROID Take 1 tablet (112 mcg total) by mouth daily.   lisinopril 20 MG tablet Commonly known as: ZESTRIL Take 1 tablet (20 mg total) by mouth daily.   metFORMIN 500 MG tablet Commonly known as: GLUCOPHAGE Take 1 tablet (500 mg total) by mouth 2 (two) times daily with a meal. (Needs to be seen before next refill)   methocarbamol 500 MG tablet Commonly known as: ROBAXIN Take 1 tablet (500 mg total) by mouth 3 (three) times daily.   multivitamin tablet Take 1 tablet by mouth daily.   OLANZapine 15 MG tablet Commonly known as: ZYPREXA Take 1 tablet (15 mg total) by mouth at bedtime.   rosuvastatin 10 MG tablet Commonly known as: Crestor Take 1 tablet (10 mg total) by mouth daily.         Objective:   BP 134/89   Pulse (!) 102   Ht 5\' 4"  (1.626 m)   Wt 247 lb (112 kg)   SpO2 97%   BMI 42.40 kg/m   Wt Readings from Last 3 Encounters:  02/01/23 247 lb (112 kg)  01/17/23 246 lb 6.4 oz (111.8 kg)  11/03/22 233 lb (105.7 kg)    Physical Exam Vitals and nursing note reviewed.  Constitutional:      General: She is not in acute distress.    Appearance: She is well-developed. She is not diaphoretic.  Eyes:     Conjunctiva/sclera: Conjunctivae normal.  Cardiovascular:     Rate and Rhythm: Normal rate and regular rhythm.     Heart sounds: Normal heart sounds. No murmur heard. Pulmonary:     Effort: Pulmonary effort is normal. No respiratory distress.     Breath sounds: Normal breath sounds. No wheezing.  Musculoskeletal:        General: No swelling or tenderness. Normal range of motion.  Skin:    General: Skin is warm and  dry.     Findings: No rash.  Neurological:     Mental Status: She is alert and oriented to person, place, and time.     Coordination: Coordination normal.  Psychiatric:        Mood and Affect: Mood is anxious and depressed.        Behavior: Behavior normal.        Thought Content: Thought content does not include suicidal ideation. Thought content does not include suicidal plan.       Assessment & Plan:   Problem List Items Addressed This Visit       Cardiovascular and Mediastinum   Hypertension     Endocrine   Hypothyroidism (acquired)   Relevant Orders   CBC with Differential/Platelet   BMP8+EGFR  TSH   Bayer DCA Hb A1c Waived   Diabetes mellitus, type II, insulin dependent ([place]) - Primary   Relevant Orders   CBC with Differential/Platelet   BMP8+EGFR   TSH   Bayer DCA Hb A1c Waived   Microalbumin / creatinine urine ratio     Other   Depression, recurrent (HCC)   Relevant Medications   DULoxetine (CYMBALTA) 60 MG capsule   Other Visit Diagnoses     Encounter for screening for HIV       Relevant Orders   HIV antibody (with reflex)       A1c 7.0, she says recently she has been having some elevated blood sugars in the 2 or [id number] after eating certain meals.  Recommended that she increase the amount of Novolin R she gives herself after certain meals. Will test HIV testing for screening.  Increase Cymbalta to 120 mg daily  Follow up plan: Return in about 3 months (around 05/04/2023), or if symptoms worsen or fail to improve, for Diabetes recheck.  Counseling provided for all of the vaccine components Orders Placed This Encounter  Procedures   CBC with Differential/Platelet   BMP8+EGFR   TSH   Bayer DCA Hb A1c Waived   HIV antibody (with reflex)   Microalbumin / creatinine urine ratio    Caryl Pina, MD [facility] 02/01/2023, 11:33 AM

## 2023-02-02 ENCOUNTER — Telehealth: Payer: Self-pay | Admitting: *Deleted

## 2023-02-02 DIAGNOSIS — F339 Major depressive disorder, recurrent, unspecified: Secondary | ICD-10-CM

## 2023-02-02 LAB — CBC WITH DIFFERENTIAL/PLATELET
Basophils Absolute: 0.1 10*3/uL (ref 0.0–0.2)
Basos: 1 %
EOS (ABSOLUTE): 0.2 10*3/uL (ref 0.0–0.4)
Eos: 2 %
Hematocrit: 40.4 % (ref 34.0–46.6)
Hemoglobin: 13.8 g/dL (ref 11.1–15.9)
Immature Grans (Abs): 0.1 10*3/uL (ref 0.0–0.1)
Immature Granulocytes: 1 %
Lymphocytes Absolute: 2.2 10*3/uL (ref 0.7–3.1)
Lymphs: 33 %
MCH: 30.7 pg (ref 26.6–33.0)
MCHC: 34.2 g/dL (ref 31.5–35.7)
MCV: 90 fL (ref 79–97)
Monocytes Absolute: 0.7 10*3/uL (ref 0.1–0.9)
Monocytes: 10 %
Neutrophils Absolute: 3.6 10*3/uL (ref 1.4–7.0)
Neutrophils: 53 %
Platelets: 233 10*3/uL (ref 150–450)
RBC: 4.49 x10E6/uL (ref 3.77–5.28)
RDW: 12.5 % (ref 11.7–15.4)
WBC: 6.7 10*3/uL (ref 3.4–10.8)

## 2023-02-02 LAB — BMP8+EGFR
BUN/Creatinine Ratio: 23 (ref 9–23)
BUN: 13 mg/dL (ref 6–24)
CO2: 23 mmol/L (ref 20–29)
Calcium: 9.7 mg/dL (ref 8.7–10.2)
Chloride: 100 mmol/L (ref 96–106)
Creatinine, Ser: 0.57 mg/dL (ref 0.57–1.00)
Glucose: 202 mg/dL — ABNORMAL HIGH (ref 70–99)
Potassium: 4.6 mmol/L (ref 3.5–5.2)
Sodium: 139 mmol/L (ref 134–144)
eGFR: 107 mL/min/{1.73_m2} (ref >59)

## 2023-02-02 LAB — TSH: TSH: 4.54 u[IU]/mL — ABNORMAL HIGH (ref 0.450–4.500)

## 2023-02-02 LAB — HIV ANTIBODY (ROUTINE TESTING W REFLEX): HIV Screen 4th Generation wRfx: NONREACTIVE

## 2023-02-02 NOTE — Telephone Encounter (Signed)
Fax from [place] [facility] RE: Duloxetine 60 mg cap Directions: Take 2 cap (120 mg total) QD. Take total of 90 mg Please clarify and send new script

## 2023-02-05 MED ORDER — DULOXETINE HCL 60 MG PO CPEP: 120.0000 mg | ORAL_CAPSULE | Freq: Every day | ORAL | 3 refills | Status: DC

## 2023-02-05 NOTE — Telephone Encounter (Signed)
Fixed Cymbalta prescription, she should be taking 120 mg daily, that was an increase.

## 2023-02-05 NOTE — Telephone Encounter (Signed)
Left message making pt aware and to call back if needed. 

## 2023-02-07 NOTE — Patient Instructions (Signed)
Tina Chapman  02/07/2023     @PREFPERIOPPHARMACY @   Your procedure is scheduled on 02/12/2023.   Report to [facility] at  1100  A.M.   Call this number if you have problems the morning of surgery:  [phone]  If you experience any cold or flu symptoms such as cough, fever, chills, shortness of breath, etc. between now and your scheduled surgery, please notify us at the above number.   Remember:  Follow the diet and prep instructions given to you by the office.  Take 8 units glargine the night before your procedure.      DO NOT take any medications for diabetes the morning of your procedure.     Take these medicines the morning of surgery with A SIP OF WATER        cymbalta, nexium, gabapentin, levothyroxine, robaxin (if needed).     Do not wear jewelry, make-up or nail polish.  Do not wear lotions, powders, or perfumes, or deodorant.  Do not shave 48 hours prior to surgery.  Men may shave face and neck.  Do not bring valuables to the hospital.  [facility] is not responsible for any belongings or valuables.  Contacts, dentures or bridgework may not be worn into surgery.  Leave your suitcase in the car.  After surgery it may be brought to your room.  For patients admitted to the hospital, discharge time will be determined by your treatment team.  Patients discharged the day of surgery will not be allowed to drive home and must have someone with them for 24 hours.    Special instructions:   DO NOT smoke tobacco or vape for 24 hours before your procedure.  Please read over the following fact sheets that you were given. Anesthesia Post-op Instructions and Care and Recovery After Surgery      Upper Endoscopy, Adult, Care After After the procedure, it is common to have a sore throat. It is also common to have: Mild stomach pain or discomfort. Bloating. Nausea. Follow these instructions at home: The instructions below may help you care for  yourself at home. Your health care provider may give you more instructions. If you have questions, ask your health care provider. If you were given a sedative during the procedure, it can affect you for several hours. Do not drive or operate machinery until your health care provider says that it is safe. If you will be going home right after the procedure, plan to have a responsible adult: Take you home from the hospital or clinic. You will not be allowed to drive. Care for you for the time you are told. Follow instructions from your health care provider about what you may eat and drink. Return to your normal activities as told by your health care provider. Ask your health care provider what activities are safe for you. Take over-the-counter and prescription medicines only as told by your health care provider. Contact a health care provider if you: Have a sore throat that lasts longer than one day. Have trouble swallowing. Have a fever. Get help right away if you: Vomit blood or your vomit looks like coffee grounds. Have bloody, black, or tarry stools. Have a very bad sore throat or you cannot swallow. Have difficulty breathing or very bad pain in your chest or abdomen. These symptoms may be an emergency. Get help right away. Call 911. Do not wait to see if the symptoms will go away.  Do not drive yourself to the hospital. Summary After the procedure, it is common to have a sore throat, mild stomach discomfort, bloating, and nausea. If you were given a sedative during the procedure, it can affect you for several hours. Do not drive until your health care provider says that it is safe. Follow instructions from your health care provider about what you may eat and drink. Return to your normal activities as told by your health care provider. This information is not intended to replace advice given to you by your health care provider. Make sure you discuss any questions you have with your health  care provider. Document Revised: 02/08/2022 Document Reviewed: 02/08/2022 Elsevier Patient Education  [place]. Esophageal Dilatation Esophageal dilatation, also called esophageal dilation, is a procedure to widen or open a blocked or narrowed part of the esophagus. The esophagus is the part of the body that moves food and liquid from the mouth to the stomach. You may need this procedure if: You have a buildup of scar tissue in your esophagus that makes it difficult, painful, or impossible to swallow. This can be caused by gastroesophageal reflux disease (GERD). You have cancer of the esophagus. There is a problem with how food moves through your esophagus. In some cases, you may need this procedure repeated at a later time to dilate the esophagus gradually. Tell a health care provider about: Any allergies you have. All medicines you are taking, including vitamins, herbs, eye drops, creams, and over-the-counter medicines. Any problems you or family members have had with anesthetic medicines. Any blood disorders you have. Any surgeries you have had. Any medical conditions you have. Any antibiotic medicines you are required to take before dental procedures. Whether you are pregnant or may be pregnant. What are the risks? Generally, this is a safe procedure. However, problems may occur, including: Bleeding due to a tear in the lining of the esophagus. A hole, or perforation, in the esophagus. What happens before the procedure? Ask your health care provider about: Changing or stopping your regular medicines. This is especially important if you are taking diabetes medicines or blood thinners. Taking medicines such as aspirin and ibuprofen. These medicines can thin your blood. Do not take these medicines unless your health care provider tells you to take them. Taking over-the-counter medicines, vitamins, herbs, and supplements. Follow instructions from your health care provider about  eating or drinking restrictions. Plan to have a responsible adult take you home from the hospital or clinic. Plan to have a responsible adult care for you for the time you are told after you leave the hospital or clinic. This is important. What happens during the procedure? You may be given a medicine to help you relax (sedative). A numbing medicine may be sprayed into the back of your throat, or you may gargle the medicine. Your health care provider may perform the dilatation using various surgical instruments, such as: Simple dilators. This instrument is carefully placed in the esophagus to stretch it. Guided wire bougies. This involves using an endoscope to insert a wire into the esophagus. A dilator is passed over this wire to enlarge the esophagus. Then the wire is removed. Balloon dilators. An endoscope with a small balloon is inserted into the esophagus. The balloon is inflated to stretch the esophagus and open it up. The procedure may vary among health care providers and hospitals. What can I expect after the procedure? Your blood pressure, heart rate, breathing rate, and blood oxygen level will be  monitored until you leave the hospital or clinic. Your throat may feel slightly sore and numb. This will get better over time. You will not be allowed to eat or drink until your throat is no longer numb. When you are able to drink, urinate, and sit on the edge of the bed without nausea or dizziness, you may be able to return home. Follow these instructions at home: Take over-the-counter and prescription medicines only as told by your health care provider. If you were given a sedative during the procedure, it can affect you for several hours. Do not drive or operate machinery until your health care provider says that it is safe. Plan to have a responsible adult care for you for the time you are told. This is important. Follow instructions from your health care provider about any eating or  drinking restrictions. Do not use any products that contain nicotine or tobacco, such as cigarettes, e-cigarettes, and chewing tobacco. If you need help quitting, ask your health care provider. Keep all follow-up visits. This is important. Contact a health care provider if: You have a fever. You have pain that is not relieved by medicine. Get help right away if: You have chest pain. You have trouble breathing. You have trouble swallowing. You vomit blood. You have black, tarry, or bloody stools. These symptoms may represent a serious problem that is an emergency. Do not wait to see if the symptoms will go away. Get medical help right away. Call your local emergency services (911 in the [place] Do not drive yourself to the hospital. Summary Esophageal dilatation, also called esophageal dilation, is a procedure to widen or open a blocked or narrowed part of the esophagus. Plan to have a responsible adult take you home from the hospital or clinic. For this procedure, a numbing medicine may be sprayed into the back of your throat, or you may gargle the medicine. Do not drive or operate machinery until your health care provider says that it is safe. This information is not intended to replace advice given to you by your health care provider. Make sure you discuss any questions you have with your health care provider. Document Revised: 03/17/2020 Document Reviewed: 03/17/2020 Elsevier Patient Education  [place]. Colonoscopy, Adult, Care After The following information offers guidance on how to care for yourself after your procedure. Your health care provider may also give you more specific instructions. If you have problems or questions, contact your health care provider. What can I expect after the procedure? After the procedure, it is common to have: A small amount of blood in your stool for 24 hours after the procedure. Some gas. Mild cramping or bloating of your abdomen. Follow  these instructions at home: Eating and drinking  Drink enough fluid to keep your urine pale yellow. Follow instructions from your health care provider about eating or drinking restrictions. Resume your normal diet as told by your health care provider. Avoid heavy or fried foods that are hard to digest. Activity Rest as told by your health care provider. Avoid sitting for a long time without moving. Get up to take short walks every 1-2 hours. This is important to improve blood flow and breathing. Ask for help if you feel weak or unsteady. Return to your normal activities as told by your health care provider. Ask your health care provider what activities are safe for you. Managing cramping and bloating  Try walking around when you have cramps or feel bloated. If directed, apply heat  to your abdomen as told by your health care provider. Use the heat source that your health care provider recommends, such as a moist heat pack or a heating pad. Place a towel between your skin and the heat source. Leave the heat on for 20-30 minutes. Remove the heat if your skin turns bright red. This is especially important if you are unable to feel pain, heat, or cold. You have a greater risk of getting burned. General instructions If you were given a sedative during the procedure, it can affect you for several hours. Do not drive or operate machinery until your health care provider says that it is safe. For the first 24 hours after the procedure: Do not sign important documents. Do not drink alcohol. Do your regular daily activities at a slower pace than normal. Eat soft foods that are easy to digest. Take over-the-counter and prescription medicines only as told by your health care provider. Keep all follow-up visits. This is important. Contact a health care provider if: You have blood in your stool 2-3 days after the procedure. Get help right away if: You have more than a small spotting of blood in your  stool. You have large blood clots in your stool. You have swelling of your abdomen. You have nausea or vomiting. You have a fever. You have increasing pain in your abdomen that is not relieved with medicine. These symptoms may be an emergency. Get help right away. Call 911. Do not wait to see if the symptoms will go away. Do not drive yourself to the hospital. Summary After the procedure, it is common to have a small amount of blood in your stool. You may also have mild cramping and bloating of your abdomen. If you were given a sedative during the procedure, it can affect you for several hours. Do not drive or operate machinery until your health care provider says that it is safe. Get help right away if you have a lot of blood in your stool, nausea or vomiting, a fever, or increased pain in your abdomen. This information is not intended to replace advice given to you by your health care provider. Make sure you discuss any questions you have with your health care provider. Document Revised: 06/22/2021 Document Reviewed: 06/22/2021 Elsevier Patient Education  [place] After The following information offers guidance on how to care for yourself after your procedure. Your health care provider may also give you more specific instructions. If you have problems or questions, contact your health care provider. What can I expect after the procedure? After the procedure, it is common to have: Tiredness. Little or no memory about what happened during or after the procedure. Impaired judgment when it comes to making decisions. Nausea or vomiting. Some trouble with balance. Follow these instructions at home: For the time period you were told by your health care provider:  Rest. Do not participate in activities where you could fall or become injured. Do not drive or use machinery. Do not drink alcohol. Do not take sleeping pills or medicines that cause  drowsiness. Do not make important decisions or sign legal documents. Do not take care of children on your own. Medicines Take over-the-counter and prescription medicines only as told by your health care provider. If you were prescribed antibiotics, take them as told by your health care provider. Do not stop using the antibiotic even if you start to feel better. Eating and drinking Follow instructions from your health care  provider about what you may eat and drink. Drink enough fluid to keep your urine pale yellow. If you vomit: Drink clear fluids slowly and in small amounts as you are able. Clear fluids include water, ice chips, low-calorie sports drinks, and fruit juice that has water added to it (diluted fruit juice). Eat light and bland foods in small amounts as you are able. These foods include bananas, applesauce, rice, lean meats, toast, and crackers. General instructions  Have a responsible adult stay with you for the time you are told. It is important to have someone help care for you until you are awake and alert. If you have sleep apnea, surgery and some medicines can increase your risk for breathing problems. Follow instructions from your health care provider about wearing your sleep device: When you are sleeping. This includes during daytime naps. While taking prescription pain medicines, sleeping medicines, or medicines that make you drowsy. Do not use any products that contain nicotine or tobacco. These products include cigarettes, chewing tobacco, and vaping devices, such as e-cigarettes. If you need help quitting, ask your health care provider. Contact a health care provider if: You feel nauseous or vomit every time you eat or drink. You feel light-headed. You are still sleepy or having trouble with balance after 24 hours. You get a rash. You have a fever. You have redness or swelling around the IV site. Get help right away if: You have trouble breathing. You have new  confusion after you get home. These symptoms may be an emergency. Get help right away. Call 911. Do not wait to see if the symptoms will go away. Do not drive yourself to the hospital. This information is not intended to replace advice given to you by your health care provider. Make sure you discuss any questions you have with your health care provider. Document Revised: 03/27/2022 Document Reviewed: 03/27/2022 Elsevier Patient Education  [place].

## 2023-02-08 ENCOUNTER — Encounter: Payer: Self-pay | Admitting: *Deleted

## 2023-02-08 ENCOUNTER — Encounter (HOSPITAL_COMMUNITY)
Admission: RE | Admit: 2023-02-08 | Discharge: 2023-02-08 | Disposition: A | Discharge status: Home / Self Care | Payer: Medicare HMO | Source: Ambulatory Visit | Attending: Internal Medicine | Admitting: Internal Medicine

## 2023-02-08 ENCOUNTER — Encounter (HOSPITAL_COMMUNITY): Payer: Self-pay

## 2023-02-08 VITALS — BP 137/80 | HR 84 | Resp 18 | Ht 64.0 in | Wt 247.0 lb

## 2023-02-08 DIAGNOSIS — Z01810 Encounter for preprocedural cardiovascular examination: Secondary | ICD-10-CM | POA: Insufficient documentation

## 2023-02-08 DIAGNOSIS — I10 Essential (primary) hypertension: Secondary | ICD-10-CM | POA: Diagnosis not present

## 2023-02-08 HISTORY — DX: Sleep apnea, unspecified: G47.30

## 2023-02-08 HISTORY — DX: Personal history of urinary calculi: Z87.442

## 2023-02-08 HISTORY — DX: Anxiety disorder, unspecified: F41.9

## 2023-02-08 HISTORY — DX: Depression, unspecified: F32.A

## 2023-02-08 HISTORY — DX: Gastro-esophageal reflux disease without esophagitis: K21.9

## 2023-02-12 ENCOUNTER — Ambulatory Visit (HOSPITAL_BASED_OUTPATIENT_CLINIC_OR_DEPARTMENT_OTHER): Payer: Medicare HMO | Admitting: Anesthesiology

## 2023-02-12 ENCOUNTER — Other Ambulatory Visit: Payer: Self-pay

## 2023-02-12 ENCOUNTER — Ambulatory Visit (HOSPITAL_COMMUNITY)
Admission: RE | Admit: 2023-02-12 | Discharge: 2023-02-12 | Disposition: A | Discharge status: Home / Self Care | Payer: Medicare HMO | Attending: Internal Medicine | Admitting: Internal Medicine

## 2023-02-12 ENCOUNTER — Encounter (HOSPITAL_COMMUNITY): Payer: Self-pay

## 2023-02-12 ENCOUNTER — Encounter (HOSPITAL_COMMUNITY)
Admission: RE | Disposition: A | Discharge status: Home / Self Care | Payer: Self-pay | Source: Home / Self Care | Attending: Internal Medicine

## 2023-02-12 ENCOUNTER — Ambulatory Visit (HOSPITAL_COMMUNITY): Payer: Medicare HMO | Admitting: Anesthesiology

## 2023-02-12 DIAGNOSIS — E119 Type 2 diabetes mellitus without complications: Secondary | ICD-10-CM | POA: Insufficient documentation

## 2023-02-12 DIAGNOSIS — F32A Depression, unspecified: Secondary | ICD-10-CM | POA: Insufficient documentation

## 2023-02-12 DIAGNOSIS — D124 Benign neoplasm of descending colon: Secondary | ICD-10-CM | POA: Diagnosis not present

## 2023-02-12 DIAGNOSIS — I10 Essential (primary) hypertension: Secondary | ICD-10-CM

## 2023-02-12 DIAGNOSIS — Q438 Other specified congenital malformations of intestine: Secondary | ICD-10-CM | POA: Insufficient documentation

## 2023-02-12 DIAGNOSIS — K219 Gastro-esophageal reflux disease without esophagitis: Secondary | ICD-10-CM | POA: Diagnosis not present

## 2023-02-12 DIAGNOSIS — Z1211 Encounter for screening for malignant neoplasm of colon: Secondary | ICD-10-CM | POA: Diagnosis not present

## 2023-02-12 DIAGNOSIS — R12 Heartburn: Secondary | ICD-10-CM | POA: Insufficient documentation

## 2023-02-12 DIAGNOSIS — Z794 Long term (current) use of insulin: Secondary | ICD-10-CM | POA: Insufficient documentation

## 2023-02-12 DIAGNOSIS — Z6841 Body Mass Index (BMI) 40.0 and over, adult: Secondary | ICD-10-CM | POA: Diagnosis not present

## 2023-02-12 DIAGNOSIS — E039 Hypothyroidism, unspecified: Secondary | ICD-10-CM | POA: Diagnosis not present

## 2023-02-12 DIAGNOSIS — R7989 Other specified abnormal findings of blood chemistry: Secondary | ICD-10-CM

## 2023-02-12 DIAGNOSIS — G4730 Sleep apnea, unspecified: Secondary | ICD-10-CM | POA: Diagnosis not present

## 2023-02-12 DIAGNOSIS — K208 Other esophagitis without bleeding: Secondary | ICD-10-CM | POA: Insufficient documentation

## 2023-02-12 DIAGNOSIS — K635 Polyp of colon: Secondary | ICD-10-CM

## 2023-02-12 DIAGNOSIS — K209 Esophagitis, unspecified without bleeding: Secondary | ICD-10-CM | POA: Diagnosis not present

## 2023-02-12 DIAGNOSIS — K2100 Gastro-esophageal reflux disease with esophagitis, without bleeding: Secondary | ICD-10-CM | POA: Diagnosis not present

## 2023-02-12 DIAGNOSIS — R1310 Dysphagia, unspecified: Secondary | ICD-10-CM | POA: Diagnosis not present

## 2023-02-12 DIAGNOSIS — K2950 Unspecified chronic gastritis without bleeding: Secondary | ICD-10-CM | POA: Diagnosis not present

## 2023-02-12 DIAGNOSIS — D125 Benign neoplasm of sigmoid colon: Secondary | ICD-10-CM | POA: Diagnosis not present

## 2023-02-12 DIAGNOSIS — K319 Disease of stomach and duodenum, unspecified: Secondary | ICD-10-CM | POA: Diagnosis not present

## 2023-02-12 DIAGNOSIS — K649 Unspecified hemorrhoids: Secondary | ICD-10-CM

## 2023-02-12 DIAGNOSIS — K2970 Gastritis, unspecified, without bleeding: Secondary | ICD-10-CM | POA: Diagnosis not present

## 2023-02-12 DIAGNOSIS — Z7984 Long term (current) use of oral hypoglycemic drugs: Secondary | ICD-10-CM | POA: Diagnosis not present

## 2023-02-12 DIAGNOSIS — K2289 Other specified disease of esophagus: Secondary | ICD-10-CM | POA: Diagnosis not present

## 2023-02-12 DIAGNOSIS — F419 Anxiety disorder, unspecified: Secondary | ICD-10-CM | POA: Diagnosis not present

## 2023-02-12 DIAGNOSIS — K3189 Other diseases of stomach and duodenum: Secondary | ICD-10-CM | POA: Insufficient documentation

## 2023-02-12 HISTORY — PX: BIOPSY: SHX5522

## 2023-02-12 HISTORY — PX: ESOPHAGOGASTRODUODENOSCOPY (EGD) WITH PROPOFOL: SHX5813

## 2023-02-12 HISTORY — PX: POLYPECTOMY: SHX5525

## 2023-02-12 HISTORY — PX: COLONOSCOPY WITH PROPOFOL: SHX5780

## 2023-02-12 LAB — GLUCOSE, CAPILLARY: Glucose-Capillary: 145 mg/dL — ABNORMAL HIGH (ref 70–99)

## 2023-02-12 SURGERY — COLONOSCOPY WITH PROPOFOL
Anesthesia: General

## 2023-02-12 MED ORDER — PROPOFOL 500 MG/50ML IV EMUL
INTRAVENOUS | Status: DC | PRN
  Administered 2023-02-12: 180 ug/kg/min via INTRAVENOUS
  Administered 2023-02-12: 150 mg via INTRAVENOUS

## 2023-02-12 MED ORDER — ESOMEPRAZOLE MAGNESIUM 40 MG PO CPDR: 40.0000 mg | DELAYED_RELEASE_CAPSULE | Freq: Two times a day (BID) | ORAL | 11 refills | Status: DC

## 2023-02-12 MED ORDER — LIDOCAINE HCL (CARDIAC) PF 100 MG/5ML IV SOSY
PREFILLED_SYRINGE | INTRAVENOUS | Status: DC | PRN
  Administered 2023-02-12: 50 mg via INTRAVENOUS

## 2023-02-12 MED ORDER — PROPOFOL 10 MG/ML IV BOLUS
INTRAVENOUS | Status: AC
  Filled 2023-02-12: qty 20

## 2023-02-12 MED ORDER — LACTATED RINGERS IV SOLN: INTRAVENOUS | Status: DC

## 2023-02-12 NOTE — Anesthesia Preprocedure Evaluation (Signed)
Anesthesia Evaluation  Patient identified by MRN, date of birth, ID band Patient awake    Reviewed: Allergy & Precautions, H&P , NPO status , Patient's Chart, lab work & pertinent test results, reviewed documented beta blocker date and time   Airway Mallampati: II  TM Distance: >3 FB Neck ROM: full    Dental no notable dental hx.    Pulmonary neg pulmonary ROS, sleep apnea    Pulmonary exam normal breath sounds clear to auscultation       Cardiovascular Exercise Tolerance: Good hypertension, negative cardio ROS  Rhythm:regular Rate:Normal     Neuro/Psych  PSYCHIATRIC DISORDERS Anxiety Depression     Neuromuscular disease negative neurological ROS  negative psych ROS   GI/Hepatic negative GI ROS, Neg liver ROS,GERD  ,,  Endo/Other  diabetesHypothyroidism  Morbid obesity  Renal/GU negative Renal ROS  negative genitourinary   Musculoskeletal   Abdominal   Peds  Hematology negative hematology ROS (+)   Anesthesia Other Findings   Reproductive/Obstetrics negative OB ROS                             Anesthesia Physical Anesthesia Plan  ASA: 3  Anesthesia Plan: General   Post-op Pain Management:    Induction:   PONV Risk Score and Plan: Propofol infusion  Airway Management Planned:   Additional Equipment:   Intra-op Plan:   Post-operative Plan:   Informed Consent: I have reviewed the patients History and Physical, chart, labs and discussed the procedure including the risks, benefits and alternatives for the proposed anesthesia with the patient or authorized representative who has indicated his/her understanding and acceptance.     Dental Advisory Given  Plan Discussed with: CRNA  Anesthesia Plan Comments:        Anesthesia Quick Evaluation

## 2023-02-12 NOTE — Discharge Instructions (Signed)
EGD Discharge instructions Please read the instructions outlined below and refer to this sheet in the next few weeks. These discharge instructions provide you with general information on caring for yourself after you leave the hospital. Your doctor may also give you specific instructions. While your treatment has been planned according to the most current medical practices available, unavoidable complications occasionally occur. If you have any problems or questions after discharge, please call your doctor. ACTIVITY You may resume your regular activity but move at a slower pace for the next 24 hours.  Take frequent rest periods for the next 24 hours.  Walking will help expel (get rid of) the air and reduce the bloated feeling in your abdomen.  No driving for 24 hours (because of the anesthesia (medicine) used during the test).  You may shower.  Do not sign any important legal documents or operate any machinery for 24 hours (because of the anesthesia used during the test).  NUTRITION Drink plenty of fluids.  You may resume your normal diet.  Begin with a light meal and progress to your normal diet.  Avoid alcoholic beverages for 24 hours or as instructed by your caregiver.  MEDICATIONS You may resume your normal medications unless your caregiver tells you otherwise.  WHAT YOU CAN EXPECT TODAY You may experience abdominal discomfort such as a feeling of fullness or "gas" pains.  FOLLOW-UP Your doctor will discuss the results of your test with you.  SEEK IMMEDIATE MEDICAL ATTENTION IF ANY OF THE FOLLOWING OCCUR: Excessive nausea (feeling sick to your stomach) and/or vomiting.  Severe abdominal pain and distention (swelling).  Trouble swallowing.  Temperature over 101 F (37.8 C).  Rectal bleeding or vomiting of blood.   Your EGD revealed moderate amount inflammation in your stomach.  I took biopsies of this to rule out infection with a bacteria called H. pylori. I also took samples of your  esophagus  Await pathology results, my office will contact you.  No tightenings or strictures of your esophagus seen.  I did not need to stretch it today.  Small bowel appeared normal.  I am going to increase your Nexium to twice daily for the next 3 months at which point you can decrease down back to once daily.  Your colonoscopy revealed 3 polyp(s) which I removed successfully. Await pathology results, my office will contact you. I recommend repeating colonoscopy in 5 years for surveillance purposes.   Follow up in GI clinic in 3 months.     I hope you have a great rest of your week!  Elon Alas. Abbey Chatters, D.O. Gastroenterology and Hepatology [facility]

## 2023-02-12 NOTE — Transfer of Care (Signed)
Immediate Anesthesia Transfer of Care Note  Patient: Tina Chapman  Procedure(s) Performed: COLONOSCOPY WITH PROPOFOL ESOPHAGOGASTRODUODENOSCOPY (EGD) WITH PROPOFOL BIOPSY POLYPECTOMY  Patient Location: Short Stay  Anesthesia Type:General  Level of Consciousness: drowsy, patient cooperative, and responds to stimulation  Airway & Oxygen Therapy: Patient Spontanous Breathing and Patient connected to nasal cannula oxygen  Post-op Assessment: Report given to RN, Post -op Vital signs reviewed and stable, and Patient moving all extremities X 4  Post vital signs: Reviewed and stable  Last Vitals:  Vitals Value Taken Time  BP 99/64 02/12/23 1256  Temp 36.3 C 02/12/23 1256  Pulse 92 02/12/23 1256  Resp 23 02/12/23 1256  SpO2 91 % 02/12/23 1256    Last Pain:  Vitals:   02/12/23 1256  TempSrc: Oral  PainSc: 0-No pain         Complications: No notable events documented.

## 2023-02-12 NOTE — Op Note (Signed)
[facility] Patient Name: Tina Chapman Procedure Date: 02/12/2023 12:12 PM MRN: [id number] Date of Birth: 1966/10/18 Attending MD: Elon Alas. Abbey Chatters , [place], [id number] CSN: [id number] Age: 57 Admit Type: Outpatient Procedure:                Colonoscopy Indications:              Screening for colorectal malignant neoplasm Providers:                Elon Alas. Abbey Chatters, DO, Lambert Mody, Raphael Gibney, Technician Referring MD:              Medicines:                See the Anesthesia note for documentation of the                            administered medications Complications:            No immediate complications. Estimated Blood Loss:     Estimated blood loss was minimal. Procedure:                Pre-Anesthesia Assessment:                           - The anesthesia plan was to use monitored                            anesthesia care (MAC).                           After obtaining informed consent, the colonoscope                            was passed under direct vision. Throughout the                            procedure, the patient's blood pressure, pulse, and                            oxygen saturations were monitored continuously. The                            [id number]-[id number]) scope was introduced through                            the anus and advanced to the the cecum, identified                            by appendiceal orifice and ileocecal valve. The                            colonoscopy was technically difficult and complex                            due to a redundant colon and  significant looping.                            Successful completion of the procedure was aided by                            applying abdominal pressure. The patient tolerated                            the procedure well. The quality of the bowel                            preparation was evaluated using the BBPS [facility]                             Bowel Preparation Scale) with scores of: Right                            Colon = 2 (minor amount of residual staining, small                            fragments of stool and/or opaque liquid, but mucosa                            seen well), Transverse Colon = 2 (minor amount of                            residual staining, small fragments of stool and/or                            opaque liquid, but mucosa seen well) and Left Colon                            = 2 (minor amount of residual staining, small                            fragments of stool and/or opaque liquid, but mucosa                            seen well). The total BBPS score equals 6. Fair. Scope In: 12:27:59 PM Scope Out: 12:51:46 PM Scope Withdrawal Time: 0 hours 21 minutes 16 seconds  Total Procedure Duration: 0 hours 23 minutes 47 seconds  Findings:      Three sessile polyps were found in the sigmoid colon and descending       colon. The polyps were 4 to 5 mm in size. These polyps were removed with       a cold snare. Resection and retrieval were complete.      A moderate amount of semi-liquid stool was found in the entire colon,       making visualization difficult. Lavage of the area was performed using       copious amounts of sterile water, resulting in clearance with fair       visualization. Impression:               -  Three 4 to 5 mm polyps in the sigmoid colon and                            in the descending colon, removed with a cold snare.                            Resected and retrieved.                           - Stool in the entire examined colon. Moderate Sedation:      Per Anesthesia Care Recommendation:           - Patient has a contact number available for                            emergencies. The signs and symptoms of potential                            delayed complications were discussed with the                            patient. Return to normal activities tomorrow.                             Written discharge instructions were provided to the                            patient.                           - Resume previous diet.                           - Continue present medications.                           - Await pathology results.                           - Repeat colonoscopy in 5 years for surveillance                            and borderline colon prep.                           - Return to GI clinic in 3 months. Procedure Code(s):        --- Professional ---                           [phone], Colonoscopy, flexible; with removal of                            tumor(s), polyp(s), or other lesion(s) by snare                            technique Diagnosis Code(s):        ---  Professional ---                           Z12.11, Encounter for screening for malignant                            neoplasm of colon                           D12.5, Benign neoplasm of sigmoid colon                           D12.4, Benign neoplasm of descending colon CPT copyright 2022 American [employer] Association. All rights reserved. The codes documented in this report are preliminary and upon coder review may  be revised to meet current compliance requirements. Elon Alas. Abbey Chatters, DO [place] Abbey Chatters, DO 02/12/2023 12:56:21 PM This report has been signed electronically. Number of Addenda: 0

## 2023-02-12 NOTE — Op Note (Signed)
[facility] Patient Name: Tina Chapman Procedure Date: 02/12/2023 12:12 PM MRN: [id number] Date of Birth: 1966/09/16 Attending MD: Elon Alas. Abbey Chatters , [place], [id number] CSN: [id number] Age: 57 Admit Type: Outpatient Procedure:                Upper GI endoscopy Indications:              Dysphagia, Heartburn Providers:                Elon Alas. Abbey Chatters, DO, Lambert Mody, Raphael Gibney, Technician Referring MD:              Medicines:                See the Anesthesia note for documentation of the                            administered medications Complications:            No immediate complications. Estimated Blood Loss:     Estimated blood loss was minimal. Procedure:                Pre-Anesthesia Assessment:                           - The anesthesia plan was to use monitored                            anesthesia care (MAC).                           After obtaining informed consent, the endoscope was                            passed under direct vision. Throughout the                            procedure, the patient's blood pressure, pulse, and                            oxygen saturations were monitored continuously. The                            [id number]) scope was introduced through the                            mouth, and advanced to the second part of duodenum.                            The upper GI endoscopy was accomplished without                            difficulty. The patient tolerated the procedure                            well. Scope In: 12:20:16 PM Scope  Out: 12:24:13 PM Total Procedure Duration: 0 hours 3 minutes 57 seconds  Findings:      The Z-line was irregular. Biopsies were taken with a cold forceps for       histology.      Patchy moderate inflammation characterized by erosions and erythema was       found in the entire examined stomach. Biopsies were taken with a cold       forceps for Helicobacter  pylori testing.      The duodenal bulb, first portion of the duodenum and second portion of       the duodenum were normal. Impression:               - Z-line irregular. Biopsied.                           - Gastritis. Biopsied.                           - Normal duodenal bulb, first portion of the                            duodenum and second portion of the duodenum. Moderate Sedation:      Per Anesthesia Care Recommendation:           - Patient has a contact number available for                            emergencies. The signs and symptoms of potential                            delayed complications were discussed with the                            patient. Return to normal activities tomorrow.                            Written discharge instructions were provided to the                            patient.                           - Resume previous diet.                           - Continue present medications.                           - Await pathology results.                           - Use a proton pump inhibitor PO BID for 12 weeks.                           - Return to GI clinic in 3 months. Procedure Code(s):        --- Professional ---  [id number], Esophagogastroduodenoscopy, flexible,                            transoral; with biopsy, single or multiple Diagnosis Code(s):        --- Professional ---                           K22.89, Other specified disease of esophagus                           K29.70, Gastritis, unspecified, without bleeding                           R13.10, Dysphagia, unspecified                           R12, Heartburn CPT copyright 2022 American Medical Association. All rights reserved. The codes documented in this report are preliminary and upon coder review may  be revised to meet current compliance requirements. Elon Alas. Abbey Chatters, DO [place] Abbey Chatters, DO 02/12/2023 12:27:07 PM This report has been signed  electronically. Number of Addenda: 0

## 2023-02-12 NOTE — Interval H&P Note (Signed)
History and Physical Interval Note:  02/12/2023 11:37 AM  [id number] Tina Chapman  has presented today for surgery, with the diagnosis of screening colon cancer, dysphagia,GERD.  The various methods of treatment have been discussed with the patient and family. After consideration of risks, benefits and other options for treatment, the patient has consented to  Procedure(s) with comments: COLONOSCOPY WITH PROPOFOL (N/A) - 12:45 pm ESOPHAGOGASTRODUODENOSCOPY (EGD) WITH PROPOFOL (N/A) BALLOON DILATION (N/A) as a surgical intervention.  The patient's history has been reviewed, patient examined, no change in status, stable for surgery.  I have reviewed the patient's chart and labs.  Questions were answered to the patient's satisfaction.     Eloise Harman

## 2023-02-12 NOTE — Anesthesia Procedure Notes (Signed)
Procedure Name: General with mask airway Date/Time: 02/12/2023 12:16 PM  Performed by: Maude Leriche, CRNAPre-anesthesia Checklist: Patient identified, Emergency Drugs available, Suction available, Patient being monitored and Timeout performed Patient Re-evaluated:Patient Re-evaluated prior to induction Oxygen Delivery Method: Nasal cannula Preoxygenation: Pre-oxygenation with 100% oxygen Dental Injury: Teeth and Oropharynx as per pre-operative assessment

## 2023-02-15 NOTE — Anesthesia Postprocedure Evaluation (Signed)
Anesthesia Post Note  Patient: Srishti Labrake  Procedure(s) Performed: COLONOSCOPY WITH PROPOFOL ESOPHAGOGASTRODUODENOSCOPY (EGD) WITH PROPOFOL BIOPSY POLYPECTOMY  Patient location during evaluation: Phase II Anesthesia Type: General Level of consciousness: awake Pain management: pain level controlled Vital Signs Assessment: post-procedure vital signs reviewed and stable Respiratory status: spontaneous breathing and respiratory function stable Cardiovascular status: blood pressure returned to baseline and stable Postop Assessment: no headache and no apparent nausea or vomiting Anesthetic complications: no Comments: Late entry   No notable events documented.   Last Vitals:  Vitals:   02/12/23 1256 02/12/23 1302  BP: 99/64 91/66  Pulse: 92   Resp: (!) 23   Temp: (!) 36.3 C   SpO2: 91%     Last Pain:  Vitals:   02/13/23 1024  TempSrc:   PainSc: 0-No pain                 Louann Sjogren

## 2023-02-16 ENCOUNTER — Ambulatory Visit: Payer: Medicare Other | Admitting: Family Medicine

## 2023-02-18 LAB — SURGICAL PATHOLOGY

## 2023-02-19 ENCOUNTER — Encounter (HOSPITAL_COMMUNITY): Payer: Self-pay | Admitting: Internal Medicine

## 2023-02-20 ENCOUNTER — Telehealth: Payer: Self-pay | Admitting: Family Medicine

## 2023-03-13 DIAGNOSIS — R7989 Other specified abnormal findings of blood chemistry: Secondary | ICD-10-CM | POA: Diagnosis not present

## 2023-03-14 ENCOUNTER — Telehealth: Payer: Self-pay | Admitting: Family Medicine

## 2023-03-14 ENCOUNTER — Other Ambulatory Visit: Payer: Self-pay | Admitting: *Deleted

## 2023-03-14 DIAGNOSIS — R7989 Other specified abnormal findings of blood chemistry: Secondary | ICD-10-CM

## 2023-03-14 DIAGNOSIS — M50121 Cervical disc disorder at C4-C5 level with radiculopathy: Secondary | ICD-10-CM

## 2023-03-14 LAB — HEPATIC FUNCTION PANEL
ALT: 37 IU/L — ABNORMAL HIGH (ref 0–32)
AST: 30 IU/L (ref 0–40)
Albumin: 4.4 g/dL (ref 3.8–4.9)
Alkaline Phosphatase: 128 IU/L — ABNORMAL HIGH (ref 44–121)
Bilirubin Total: 0.9 mg/dL (ref 0.0–1.2)
Bilirubin, Direct: 0.23 mg/dL (ref 0.00–0.40)
Total Protein: 7.4 g/dL (ref 6.0–8.5)

## 2023-03-14 LAB — GAMMA GT: GGT: 26 IU/L (ref 0–60)

## 2023-03-14 NOTE — Telephone Encounter (Signed)
REFERRAL REQUEST Telephone Note  Have you been seen at our office for this problem? yes (Advise that they may need an appointment with their PCP before a referral can be done)  Reason for Referral: to continue taking back injection Referral discussed with patient: yes  Best contact number of patient for referral team: [phone]    Has patient been seen by a specialist for this issue before: yes she was seeing dr. Abigail Chapman but he retired and needs new referral to new back specialist  Patient provider preference for referral: DR. Lorrine Chapman Patient location preference for referral: [facility] fax #[phone] ph#[phone]   Patient notified that referrals can take up to a week or longer to process. If they haven't heard anything within a week they should call back and speak with the referral department.

## 2023-03-16 ENCOUNTER — Other Ambulatory Visit: Payer: Self-pay | Admitting: *Deleted

## 2023-03-16 DIAGNOSIS — Z794 Long term (current) use of insulin: Secondary | ICD-10-CM

## 2023-03-16 MED ORDER — BASAGLAR KWIKPEN 100 UNIT/ML ~~LOC~~ SOPN: 20.0000 [IU] | PEN_INJECTOR | Freq: Every day | SUBCUTANEOUS | 0 refills | Status: DC

## 2023-03-16 NOTE — Telephone Encounter (Signed)
Fax from [facility] RE: Tina Chapman Ins prefers Lantus solostar, Nathen May or [place] Please Advise Next OV 05/10/23

## 2023-03-18 NOTE — Telephone Encounter (Signed)
Placed referral  

## 2023-03-19 NOTE — Telephone Encounter (Signed)
Pt made aware

## 2023-03-20 MED ORDER — TRESIBA FLEXTOUCH 200 UNIT/ML ~~LOC~~ SOPN: 20.0000 [IU] | PEN_INJECTOR | Freq: Every evening | SUBCUTANEOUS | 2 refills | Status: DC

## 2023-03-20 NOTE — Addendum Note (Signed)
Addended by: Jannifer Rodney A on: 03/20/2023 09:47 AM   Modules accepted: Orders

## 2023-03-20 NOTE — Telephone Encounter (Signed)
Basaglar changed to [place] per insurance.

## 2023-03-26 ENCOUNTER — Telehealth: Payer: Self-pay

## 2023-03-26 NOTE — Telephone Encounter (Signed)
Basaglar KwikPen 100UNIT/ML pen-injectors   ([facility]) Rx #: [id number]  Not on medication list Does PA need to be completed?   Covering PCP please advise

## 2023-03-26 NOTE — Telephone Encounter (Signed)
This has already been taken care of and changed patient was already aware insurance did not cover.

## 2023-03-26 NOTE — Telephone Encounter (Signed)
Please call patient and see what insulin patient is taking. Is it Hospital doctor or Evaristo Bury?

## 2023-03-28 ENCOUNTER — Ambulatory Visit (HOSPITAL_COMMUNITY)
Admission: RE | Admit: 2023-03-28 | Discharge: 2023-03-28 | Disposition: A | Discharge status: Home / Self Care | Payer: Medicare HMO | Source: Ambulatory Visit | Attending: Gastroenterology | Admitting: Gastroenterology

## 2023-03-28 DIAGNOSIS — R161 Splenomegaly, not elsewhere classified: Secondary | ICD-10-CM | POA: Diagnosis not present

## 2023-03-28 DIAGNOSIS — R7989 Other specified abnormal findings of blood chemistry: Secondary | ICD-10-CM

## 2023-03-29 DIAGNOSIS — M5416 Radiculopathy, lumbar region: Secondary | ICD-10-CM | POA: Diagnosis not present

## 2023-03-29 DIAGNOSIS — M5412 Radiculopathy, cervical region: Secondary | ICD-10-CM | POA: Diagnosis not present

## 2023-03-29 DIAGNOSIS — M47816 Spondylosis without myelopathy or radiculopathy, lumbar region: Secondary | ICD-10-CM | POA: Diagnosis not present

## 2023-03-29 DIAGNOSIS — M542 Cervicalgia: Secondary | ICD-10-CM | POA: Diagnosis not present

## 2023-04-16 DIAGNOSIS — M5416 Radiculopathy, lumbar region: Secondary | ICD-10-CM | POA: Diagnosis not present

## 2023-04-30 ENCOUNTER — Telehealth: Payer: Self-pay | Admitting: Family Medicine

## 2023-04-30 NOTE — Telephone Encounter (Signed)
Pt made aware and understood. She will call back if needed.

## 2023-04-30 NOTE — Telephone Encounter (Signed)
I believes that she is currently taking [place] 20 units daily, have her increase to take 24 units of Tresiba daily until her blood sugars start coming back down.

## 2023-05-04 DIAGNOSIS — M47816 Spondylosis without myelopathy or radiculopathy, lumbar region: Secondary | ICD-10-CM | POA: Diagnosis not present

## 2023-05-04 DIAGNOSIS — M5412 Radiculopathy, cervical region: Secondary | ICD-10-CM | POA: Diagnosis not present

## 2023-05-04 DIAGNOSIS — Z6841 Body Mass Index (BMI) 40.0 and over, adult: Secondary | ICD-10-CM | POA: Diagnosis not present

## 2023-05-04 DIAGNOSIS — M5416 Radiculopathy, lumbar region: Secondary | ICD-10-CM | POA: Diagnosis not present

## 2023-05-04 DIAGNOSIS — M542 Cervicalgia: Secondary | ICD-10-CM | POA: Diagnosis not present

## 2023-05-10 ENCOUNTER — Encounter: Payer: Self-pay | Admitting: Family Medicine

## 2023-05-10 ENCOUNTER — Ambulatory Visit (INDEPENDENT_AMBULATORY_CARE_PROVIDER_SITE_OTHER): Payer: Medicare HMO | Admitting: Family Medicine

## 2023-05-10 VITALS — BP 131/85 | HR 88 | Ht 64.0 in | Wt 261.0 lb

## 2023-05-10 DIAGNOSIS — E119 Type 2 diabetes mellitus without complications: Secondary | ICD-10-CM | POA: Diagnosis not present

## 2023-05-10 DIAGNOSIS — E1169 Type 2 diabetes mellitus with other specified complication: Secondary | ICD-10-CM

## 2023-05-10 DIAGNOSIS — Z7984 Long term (current) use of oral hypoglycemic drugs: Secondary | ICD-10-CM

## 2023-05-10 DIAGNOSIS — I10 Essential (primary) hypertension: Secondary | ICD-10-CM

## 2023-05-10 DIAGNOSIS — Z794 Long term (current) use of insulin: Secondary | ICD-10-CM

## 2023-05-10 DIAGNOSIS — E039 Hypothyroidism, unspecified: Secondary | ICD-10-CM

## 2023-05-10 DIAGNOSIS — I152 Hypertension secondary to endocrine disorders: Secondary | ICD-10-CM | POA: Diagnosis not present

## 2023-05-10 LAB — BAYER DCA HB A1C WAIVED: HB A1C (BAYER DCA - WAIVED): 9.4 % — ABNORMAL HIGH (ref 4.8–5.6)

## 2023-05-10 MED ORDER — TRESIBA FLEXTOUCH 200 UNIT/ML ~~LOC~~ SOPN: 24.0000 [IU] | PEN_INJECTOR | Freq: Every evening | SUBCUTANEOUS | 2 refills | Status: DC

## 2023-05-10 NOTE — Progress Notes (Signed)
BP 131/85   Pulse 88   Ht 5\' 4"  (1.626 m)   Wt 261 lb (118.4 kg)   SpO2 95%   BMI 44.80 kg/m    Subjective:   Patient ID: Tina Chapman, female    DOB: [birth year], 57 y.o.   MRN: [id number]  HPI: Tina Chapman is a 57 y.o. female presenting on 05/10/2023 for Medical Management of Chronic Issues, Diabetes, and Hypothyroidism   HPI Type 2 diabetes mellitus Patient comes in today for recheck of his diabetes. Patient has been currently taking [place] and metformin and Humalin/Novolin, her insurance switches were back-and-forth between the 2. Patient is currently on an ACE inhibitor/ARB. Patient has not seen an ophthalmologist this year. Patient denies any new issues with their feet. The symptom started onset as an adult hypertension and hypothyroidism ARE RELATED TO DM   Hypertension Patient is currently on lisinopril, and their blood pressure today is 131/85. Patient denies any lightheadedness or dizziness. Patient denies headaches, blurred vision, chest pains, shortness of breath, or weakness. Denies any side effects from medication and is content with current medication.   Hypothyroidism recheck Patient is coming in for thyroid recheck today as well. They deny any issues with hair changes or heat or cold problems or diarrhea or constipation. They deny any chest pain or palpitations. They are currently on levothyroxine 112 micrograms   She has started to see a new back doctor with [facility], she says that Dr. Fredirick Maudlin and she sees a PA Celedonio Miyamoto  Relevant past medical, surgical, family and social history reviewed and updated as indicated. Interim medical history since our last visit reviewed. Allergies and medications reviewed and updated.  Review of Systems  Constitutional:  Negative for chills and fever.  Eyes:  Negative for visual disturbance.  Respiratory:  Negative for chest tightness and shortness of breath.   Cardiovascular:  Negative for chest pain and  leg swelling.  Musculoskeletal:  Negative for back pain and gait problem.  Skin:  Negative for rash.  Neurological:  Negative for light-headedness and headaches.  Psychiatric/Behavioral:  Negative for agitation and behavioral problems.   All other systems reviewed and are negative.   Per HPI unless specifically indicated above   Allergies as of 05/10/2023   No Known Allergies      Medication List        Accurate as of May 10, 2023 10:16 AM. If you have any questions, ask your nurse or doctor.          STOP taking these medications    FreeStyle Libre 14 Day Reader Vonna Drafts by: Elige Radon Shianna Bally, MD       TAKE these medications    Accu-Chek Guide test strip Generic drug: glucose blood Test BS 3 times daily Dx E11.9   Accu-Chek Guide w/Device Kit 1 each by Does not apply route 2 (two) times daily.   Accu-Chek Softclix Lancets lancets Check BS QID Dx E11.9   DULoxetine 60 MG capsule Commonly known as: CYMBALTA Take 2 capsules (120 mg total) by mouth daily.   esomeprazole 40 MG capsule Commonly known as: NexIUM Take 1 capsule (40 mg total) by mouth 2 (two) times daily before a meal.   fluticasone 50 MCG/ACT nasal spray Commonly known as: FLONASE Place 1 spray into both nostrils 2 (two) times daily as needed for allergies or rhinitis.   gabapentin 600 MG tablet Commonly known as: NEURONTIN Take 1 tablet (600 mg total) by mouth 3 (three) times daily.  insulin regular 100 units/mL injection Commonly known as: NovoLIN R Inject 0.08 mLs (8 Units total) into the skin 3 (three) times daily before meals.   Insulin Syringe-Needle U-100 31G X 5/16" 0.3 ML Misc Commonly known as: BD Insulin Syringe U/F USE FOUR TIMES DAILY w/ insulin Dx E11.9   levothyroxine 112 MCG tablet Commonly known as: SYNTHROID Take 1 tablet (112 mcg total) by mouth daily.   lisinopril 20 MG tablet Commonly known as: ZESTRIL Take 1 tablet (20 mg total) by mouth daily.    metFORMIN 500 MG tablet Commonly known as: GLUCOPHAGE Take 1 tablet (500 mg total) by mouth 2 (two) times daily with a meal. (Needs to be seen before next refill)   methocarbamol 500 MG tablet Commonly known as: ROBAXIN Take 1 tablet (500 mg total) by mouth 3 (three) times daily.   OLANZapine 15 MG tablet Commonly known as: ZYPREXA Take 1 tablet (15 mg total) by mouth at bedtime.   rosuvastatin 10 MG tablet Commonly known as: Crestor Take 1 tablet (10 mg total) by mouth daily.   Evaristo Bury FlexTouch 200 UNIT/ML FlexTouch Pen Generic drug: insulin degludec Inject 24 Units into the skin every evening. What changed: how much to take Changed by: Elige Radon Gertie Broerman, MD         Objective:   BP 131/85   Pulse 88   Ht 5\' 4"  (1.626 m)   Wt 261 lb (118.4 kg)   SpO2 95%   BMI 44.80 kg/m   Wt Readings from Last 3 Encounters:  05/10/23 261 lb (118.4 kg)  02/12/23 246 lb 14.6 oz (112 kg)  02/08/23 247 lb (112 kg)    Physical Exam Vitals and nursing note reviewed.  Constitutional:      General: She is not in acute distress.    Appearance: She is well-developed. She is not diaphoretic.  Eyes:     Conjunctiva/sclera: Conjunctivae normal.  Cardiovascular:     Rate and Rhythm: Normal rate and regular rhythm.     Heart sounds: Normal heart sounds. No murmur heard. Pulmonary:     Effort: Pulmonary effort is normal. No respiratory distress.     Breath sounds: Normal breath sounds. No wheezing.  Musculoskeletal:        General: No swelling or tenderness. Normal range of motion.  Skin:    General: Skin is warm and dry.     Findings: No rash.  Neurological:     Mental Status: She is alert and oriented to person, place, and time.     Coordination: Coordination normal.  Psychiatric:        Behavior: Behavior normal.       Assessment & Plan:   Problem List Items Addressed This Visit       Cardiovascular and Mediastinum   Hypertension     Endocrine   Hypothyroidism  (acquired)   Relevant Orders   TSH   Bayer DCA Hb A1c Waived   Diabetes mellitus, type II, insulin dependent (HCC) - Primary   Relevant Medications   insulin degludec (TRESIBA FLEXTOUCH) 200 UNIT/ML FlexTouch Pen   Other Relevant Orders   TSH   Bayer DCA Hb A1c Waived   Microalbumin / creatinine urine ratio    A1c is 9.4, she just adjusted her insulin up yesterday and she says the blood sugars are running better today in the 130s in the morning so she thinks that is helping and we will see how she does over the next couple weeks and she will  let us know.  Blood pressure and everything else looks good. Follow up plan: Return in about 3 months (around 08/10/2023), or if symptoms worsen or fail to improve, for Diabetes and hypertension and hypothyroidism.  Counseling provided for all of the vaccine components Orders Placed This Encounter  Procedures   TSH   Bayer DCA Hb A1c Waived   Microalbumin / creatinine urine ratio    Arville Care, MD [facility] 05/10/2023, 10:16 AM

## 2023-05-11 LAB — TSH: TSH: 14.3 u[IU]/mL — ABNORMAL HIGH (ref 0.450–4.500)

## 2023-05-11 LAB — MICROALBUMIN / CREATININE URINE RATIO
Creatinine, Urine: 58.7 mg/dL
Microalb/Creat Ratio: 22 mg/g creat (ref 0–29)
Microalbumin, Urine: 13 ug/mL

## 2023-05-15 ENCOUNTER — Telehealth: Payer: Self-pay | Admitting: Family Medicine

## 2023-05-15 NOTE — Telephone Encounter (Signed)
Pt called to get Humalin Rx sent to [facility] in [place]. Says she was taking Novolin but Dr Dettinger switched her to [employer] because insurance does not cover the Novolin anymore. Please advise.

## 2023-05-16 MED ORDER — INSULIN REGULAR HUMAN 100 UNIT/ML IJ SOLN: 8.0000 [IU] | Freq: Three times a day (TID) | INTRAMUSCULAR | 11 refills | Status: DC

## 2023-05-16 NOTE — Telephone Encounter (Signed)
Called lm that RX sent to pharmacy - call office with any questions/concerns

## 2023-05-16 NOTE — Telephone Encounter (Signed)
Sent Humulin prescription for her

## 2023-05-23 ENCOUNTER — Other Ambulatory Visit: Payer: Self-pay

## 2023-05-23 DIAGNOSIS — E039 Hypothyroidism, unspecified: Secondary | ICD-10-CM

## 2023-05-23 MED ORDER — LEVOTHYROXINE SODIUM 125 MCG PO TABS
125.0000 ug | ORAL_TABLET | Freq: Every day | ORAL | 1 refills | Status: DC
Start: 2023-05-23 — End: 2023-11-08

## 2023-05-31 ENCOUNTER — Encounter: Payer: Self-pay | Admitting: Internal Medicine

## 2023-05-31 ENCOUNTER — Ambulatory Visit (INDEPENDENT_AMBULATORY_CARE_PROVIDER_SITE_OTHER): Payer: Medicare HMO | Admitting: Internal Medicine

## 2023-05-31 VITALS — BP 105/73 | HR 96 | Temp 98.6°F | Ht 64.0 in | Wt 258.8 lb

## 2023-05-31 DIAGNOSIS — K219 Gastro-esophageal reflux disease without esophagitis: Secondary | ICD-10-CM

## 2023-05-31 DIAGNOSIS — D126 Benign neoplasm of colon, unspecified: Secondary | ICD-10-CM

## 2023-05-31 DIAGNOSIS — K7581 Nonalcoholic steatohepatitis (NASH): Secondary | ICD-10-CM | POA: Diagnosis not present

## 2023-05-31 DIAGNOSIS — R7989 Other specified abnormal findings of blood chemistry: Secondary | ICD-10-CM

## 2023-05-31 NOTE — Progress Notes (Signed)
Referring Provider: Dettinger, Elige Radon, MD Primary Care Physician:  Dettinger, Elige Radon, MD Primary GI:  Dr. Marletta Lor  Chief Complaint  Patient presents with   Follow-up    Follow up elevated LFT's, and both procedures    HPI:   Tina Chapman is a 57 y.o. female who presents to clinic today for follow-up visit.  Chronic GERD and dysphagia: EGD 02/12/2023 with gastritis, irregular Z-line.  Biopsies with H. pylori negative gastritis and evidence of acid reflux.  Increase Nexium to twice daily patient states she is doing much better.  Dysphagia has resolved.  Adenomatous colon polyps: 3 polyps removed on colonoscopy 02/12/2023.  Tubular adenomas on path.  Repeat in 5 years.  Elevated alk phos: Previous serological workup unremarkable. AMA negative, normal GGT.  Ultrasound 03/28/2023 with evidence of fatty liver, splenomegaly.  Past Medical History:  Diagnosis Date   Anxiety    Back pain    Depression    Diabetes (HCC)    GERD (gastroesophageal reflux disease)    History of kidney stones    Hyperlipidemia    Hypertension    Hypothyroidism    Sciatic nerve pain    Sleep apnea     Past Surgical History:  Procedure Laterality Date   ABDOMINAL HYSTERECTOMY     APPENDECTOMY     BIOPSY  02/12/2023   Procedure: BIOPSY;  Surgeon: Lanelle Bal, DO;  [facility]: [facility];  Service: Endoscopy;;   CERVIX SURGERY     CHOLECYSTECTOMY     COLONOSCOPY WITH ESOPHAGOGASTRODUODENOSCOPY (EGD)     in [place]   COLONOSCOPY WITH PROPOFOL N/A 02/12/2023   Procedure: COLONOSCOPY WITH PROPOFOL;  Surgeon: Lanelle Bal, DO;  Location: [facility];  Service: Endoscopy;  Laterality: N/A;  12:45 pm   ESOPHAGOGASTRODUODENOSCOPY (EGD) WITH PROPOFOL N/A 02/12/2023   Procedure: ESOPHAGOGASTRODUODENOSCOPY (EGD) WITH PROPOFOL;  Surgeon: Lanelle Bal, DO;  [facility]: [facility];  Service: Endoscopy;  Laterality: N/A;   fatty tumor removed     NECK SURGERY  2002   has metal plate     POLYPECTOMY  02/12/2023   Procedure: POLYPECTOMY;  Surgeon: Lanelle Bal, DO;  Location: [facility];  Service: Endoscopy;;   TONSILLECTOMY      Current Outpatient Medications  Medication Sig Dispense Refill   Accu-Chek Softclix Lancets lancets Check BS QID Dx E11.9 400 each 3   Blood Glucose Monitoring Suppl (ACCU-CHEK GUIDE) w/Device KIT 1 each by Does not apply route 2 (two) times daily. 1 kit 0   DULoxetine (CYMBALTA) 60 MG capsule Take 2 capsules (120 mg total) by mouth daily. 180 capsule 3   esomeprazole (NEXIUM) 40 MG capsule Take 1 capsule (40 mg total) by mouth 2 (two) times daily before a meal. 60 capsule 11   fluticasone (FLONASE) 50 MCG/ACT nasal spray Place 1 spray into both nostrils 2 (two) times daily as needed for allergies or rhinitis. 16 g 6   gabapentin (NEURONTIN) 600 MG tablet Take 1 tablet (600 mg total) by mouth 3 (three) times daily. 270 tablet 3   glucose blood (ACCU-CHEK GUIDE) test strip Test BS 3 times daily Dx E11.9 300 each 3   insulin degludec (TRESIBA FLEXTOUCH) 200 UNIT/ML FlexTouch Pen Inject 24 Units into the skin every evening. 3 mL 2   insulin regular (HUMULIN R) 100 units/mL injection Inject 0.08 mLs (8 Units total) into the skin 3 (three) times daily before meals. 10 mL 11   Insulin Syringe-Needle U-100 (BD INSULIN  SYRINGE U/F) 31G X 5/16" 0.3 ML MISC USE FOUR TIMES DAILY w/ insulin Dx E11.9 400 each 3   levothyroxine (SYNTHROID) 125 MCG tablet Take 1 tablet (125 mcg total) by mouth daily. 90 tablet 1   lisinopril (ZESTRIL) 20 MG tablet Take 1 tablet (20 mg total) by mouth daily. 90 tablet 3   metFORMIN (GLUCOPHAGE) 500 MG tablet Take 1 tablet (500 mg total) by mouth 2 (two) times daily with a meal. (Needs to be seen before next refill) 180 tablet 3   methocarbamol (ROBAXIN) 500 MG tablet Take 1 tablet (500 mg total) by mouth 3 (three) times daily. 90 tablet 2   OLANZapine (ZYPREXA) 15 MG tablet Take 1 tablet (15 mg total) by mouth at bedtime. 90  tablet 3   rosuvastatin (CRESTOR) 10 MG tablet Take 1 tablet (10 mg total) by mouth daily. 90 tablet 3   No current facility-administered medications for this visit.    Allergies as of 05/31/2023   (No Known Allergies)    Family History  Problem Relation Age of Onset   Early death Mother        44   Arthritis Mother    Heart disease Mother    Arthritis Father    Diabetes Paternal Uncle    Cancer Maternal Grandmother        colon, age 71   Esophageal cancer Neg Hx    Stomach cancer Neg Hx    Breast cancer Neg Hx     Social History   Socioeconomic History   Marital status: Divorced    Spouse name: Not on file   Number of children: Not on file   Years of education: Not on file   Highest education level: Not on file  Occupational History   Not on file  Tobacco Use   Smoking status: Never   Smokeless tobacco: Never  Vaping Use   Vaping status: Never Used  Substance and Sexual Activity   Alcohol use: Never   Drug use: Never   Sexual activity: Not Currently  Other Topics Concern   Not on file  Social History Narrative   74 son 7 years old, here with her   Social Determinants of Health   Financial Resource Strain: Not on file  Food Insecurity: Not on file  Transportation Needs: Not on file  Physical Activity: Not on file  Stress: Not on file  Social Connections: Unknown (03/28/2022)   Received from [facility] [address]   Social Network    Social Network: Not on file    Subjective: Review of Systems  Constitutional:  Negative for chills and fever.  HENT:  Negative for congestion and hearing loss.   Eyes:  Negative for blurred vision and double vision.  Respiratory:  Negative for cough and shortness of breath.   Cardiovascular:  Negative for chest pain and palpitations.  Gastrointestinal:  Negative for abdominal pain, blood in stool, constipation, diarrhea, heartburn, melena and vomiting.  Genitourinary:  Negative for dysuria and urgency.  Musculoskeletal:   Negative for joint pain and myalgias.  Skin:  Negative for itching and rash.  Neurological:  Negative for dizziness and headaches.  Psychiatric/Behavioral:  Negative for depression. The patient is not nervous/anxious.      Objective: BP 105/73   Pulse 96   Temp 98.6 F (37 C)   Ht 5\' 4"  (1.626 m)   Wt 258 lb 12.8 oz (117.4 kg)   BMI 44.42 kg/m  Physical Exam Constitutional:      Appearance:  Normal appearance. She is obese.  HENT:     Head: Normocephalic and atraumatic.  Eyes:     Extraocular Movements: Extraocular movements intact.     Conjunctiva/sclera: Conjunctivae normal.  Cardiovascular:     Rate and Rhythm: Normal rate and regular rhythm.  Pulmonary:     Effort: Pulmonary effort is normal.     Breath sounds: Normal breath sounds.  Abdominal:     General: Bowel sounds are normal.     Palpations: Abdomen is soft.  Musculoskeletal:        General: No swelling. Normal range of motion.     Cervical back: Normal range of motion and neck supple.  Skin:    General: Skin is warm and dry.     Coloration: Skin is not jaundiced.  Neurological:     General: No focal deficit present.     Mental Status: She is alert and oriented to person, place, and time.  Psychiatric:        Mood and Affect: Mood normal.        Behavior: Behavior normal.      Assessment/Plan:  1.  Elevated alkaline phosphatase-normal GGT.  Evidence of fatty liver on ultrasound.  Previous serological workup unremarkable.  Do not think her alk phos is coming from her liver given normal GGT. likely consequence of her metabolic syndrome.  Recommend low-fat diet.  Continue to monitor.  2.  Metabolic dysfunction associated steatotic liver disease (MASLD)-ultrasound with evidence of steatotic liver. Discussed fatty liver in depth with patient today.  Printed off instructions in Spanish for her.  She needs to keep her diabetes under tight control.    Recommend 1-2# weight loss per week until ideal body weight  through exercise & diet. Low fat/cholesterol diet.   Avoid sweets, sodas, fruit juices, sweetened beverages like tea, etc. Gradually increase exercise from 15 min daily up to 1 hr per day 5 days/week. Limit alcohol use.  3.  Chronic GERD-well-controlled on Nexium twice daily.  Will continue.  Esophageal dysphagia resolved.  4.  Adenomatous colon polyps, repeat colonoscopy 5 years.  Follow-up in 6 months    05/31/2023 10:49 AM   Disclaimer: This note was dictated with voice recognition software. Similar sounding words can inadvertently be transcribed and may not be corrected upon review.

## 2023-05-31 NOTE — Patient Instructions (Addendum)
Continue on Nexium twice daily for your chronic acid reflux.  We will plan on repeat colonoscopy in 5 years for surveillance purposes given your history of polyps.  You do have evidence of fatty liver disease on your ultrasound.  Recommend keeping diabetes under good control.  Recommend 1-2# weight loss per week until ideal body weight through exercise & diet. Low fat/cholesterol diet.   Avoid sweets, sodas, fruit juices, sweetened beverages like tea, etc. Gradually increase exercise from 15 min daily up to 1 hr per day 5 days/week. Limit alcohol use.  Follow-up in 6 months or sooner if needed.  It was very nice seeing you again today.  Dr. Marletta Lor

## 2023-06-25 DIAGNOSIS — M5416 Radiculopathy, lumbar region: Secondary | ICD-10-CM | POA: Diagnosis not present

## 2023-06-25 DIAGNOSIS — M5412 Radiculopathy, cervical region: Secondary | ICD-10-CM | POA: Diagnosis not present

## 2023-07-10 DIAGNOSIS — M5412 Radiculopathy, cervical region: Secondary | ICD-10-CM | POA: Diagnosis not present

## 2023-07-25 ENCOUNTER — Other Ambulatory Visit: Payer: Medicare HMO

## 2023-08-10 ENCOUNTER — Ambulatory Visit: Payer: Medicare HMO | Admitting: Family Medicine

## 2023-08-15 ENCOUNTER — Other Ambulatory Visit: Payer: Self-pay | Admitting: Family Medicine

## 2023-08-15 DIAGNOSIS — I10 Essential (primary) hypertension: Secondary | ICD-10-CM

## 2023-08-16 ENCOUNTER — Encounter: Payer: Self-pay | Admitting: Family Medicine

## 2023-08-16 ENCOUNTER — Ambulatory Visit: Payer: Medicare HMO | Admitting: Family Medicine

## 2023-08-16 VITALS — BP 152/90 | HR 96 | Ht 64.0 in | Wt 265.0 lb

## 2023-08-16 DIAGNOSIS — Z23 Encounter for immunization: Secondary | ICD-10-CM | POA: Diagnosis not present

## 2023-08-16 DIAGNOSIS — E1159 Type 2 diabetes mellitus with other circulatory complications: Secondary | ICD-10-CM

## 2023-08-16 DIAGNOSIS — E119 Type 2 diabetes mellitus without complications: Secondary | ICD-10-CM

## 2023-08-16 DIAGNOSIS — Z794 Long term (current) use of insulin: Secondary | ICD-10-CM

## 2023-08-16 DIAGNOSIS — E039 Hypothyroidism, unspecified: Secondary | ICD-10-CM | POA: Diagnosis not present

## 2023-08-16 DIAGNOSIS — F339 Major depressive disorder, recurrent, unspecified: Secondary | ICD-10-CM | POA: Diagnosis not present

## 2023-08-16 DIAGNOSIS — F419 Anxiety disorder, unspecified: Secondary | ICD-10-CM

## 2023-08-16 DIAGNOSIS — I10 Essential (primary) hypertension: Secondary | ICD-10-CM | POA: Diagnosis not present

## 2023-08-16 LAB — BAYER DCA HB A1C WAIVED: HB A1C (BAYER DCA - WAIVED): 8.3 % — ABNORMAL HIGH (ref 4.8–5.6)

## 2023-08-16 MED ORDER — ROSUVASTATIN CALCIUM 10 MG PO TABS: 10.0000 mg | ORAL_TABLET | Freq: Every day | ORAL | 3 refills | Status: DC

## 2023-08-16 MED ORDER — LISINOPRIL 40 MG PO TABS
40.0000 mg | ORAL_TABLET | Freq: Every day | ORAL | 3 refills | Status: DC
Start: 2023-08-16 — End: 2024-07-15

## 2023-08-16 MED ORDER — METFORMIN HCL 500 MG PO TABS: 500.0000 mg | ORAL_TABLET | Freq: Two times a day (BID) | ORAL | 3 refills | Status: DC

## 2023-08-16 MED ORDER — TRESIBA FLEXTOUCH 200 UNIT/ML ~~LOC~~ SOPN: 26.0000 [IU] | PEN_INJECTOR | Freq: Every evening | SUBCUTANEOUS | 2 refills | Status: DC

## 2023-08-16 MED ORDER — INSULIN REGULAR HUMAN 100 UNIT/ML IJ SOLN: 10.0000 [IU] | Freq: Three times a day (TID) | INTRAMUSCULAR | 11 refills | Status: DC

## 2023-08-16 MED ORDER — GABAPENTIN 600 MG PO TABS: 600.0000 mg | ORAL_TABLET | Freq: Three times a day (TID) | ORAL | 3 refills | Status: DC

## 2023-08-16 MED ORDER — OLANZAPINE 15 MG PO TABS
15.0000 mg | ORAL_TABLET | Freq: Every day | ORAL | 3 refills | Status: DC
Start: 2023-08-16 — End: 2024-07-15

## 2023-08-16 NOTE — Progress Notes (Signed)
BP (!) 152/90   Pulse 96   Ht 5\' 4"  (1.626 m)   Wt 265 lb (120.2 kg)   SpO2 95%   BMI 45.49 kg/m    Subjective:   Patient ID: Tina Chapman, female    DOB: 25-Feb-1966, 57 y.o.   MRN: [id number]  HPI: Tina Chapman is a 57 y.o. female presenting on 08/16/2023 for Medical Management of Chronic Issues, Diabetes, Hypertension, Hypothyroidism, and Headache   HPI Type 2 diabetes mellitus Patient comes in today for recheck of his diabetes. Patient has been currently taking [place] and metformin and Humalog. Patient is currently on an ACE inhibitor/ARB. Patient has seen an ophthalmologist this year. Patient denies any new issues with their feet. The symptom started onset as an adult hypertension and hypothyroidism and anxiety and depression ARE RELATED TO DM   Hypothyroidism recheck Patient is coming in for thyroid recheck today as well. They deny any issues with hair changes or heat or cold problems or diarrhea or constipation. They deny any chest pain or palpitations. They are currently on levothyroxine 125 micrograms   Hypertension Patient is currently on lisinopril, and their blood pressure today is 152/90. Patient denies any lightheadedness or dizziness. Patient denies blurred vision, chest pains, shortness of breath, or weakness. Denies any side effects from medication and is content with current medication.  She had been having elevated blood pressures at home as well and having headaches on.  Anxiety and depression recheck Patient is coming today for anxiety and depression recheck.  She is currently taking the Cymbalta and olanzapine which we had increased the dose of last time we saw her.  She feels like things are going well with anxiety and depression today.  Except for her blood sugars being more out of control she is feeling better.  She denies any actual suicidal ideation but just has thoughts of being a burden on others    08/16/2023   10:59 AM 05/10/2023    9:40 AM  02/01/2023   10:52 AM 11/03/2022    4:14 PM 08/09/2022    2:34 PM  Depression screen PHQ 2/9  Decreased Interest 2 2 3 3 1   Down, Depressed, Hopeless 2 2 2 3 1   PHQ - 2 Score 4 4 5 6 2   Altered sleeping 3 2 3 3 1   Tired, decreased energy 2 3 3 3 1   Change in appetite 2 2 2 1 1   Feeling bad or failure about yourself  3 3 3 3 1   Trouble concentrating 2 2 3 3 1   Moving slowly or fidgety/restless 1 0 2 2 0  Suicidal thoughts 3 2 2 2  0  PHQ-9 Score 20 18 23 23 7   Difficult doing work/chores Very difficult Very difficult Very difficult Very difficult      Relevant past medical, surgical, family and social history reviewed and updated as indicated. Interim medical history since our last visit reviewed. Allergies and medications reviewed and updated.  Review of Systems  Constitutional:  Negative for chills and fever.  HENT:  Negative for congestion, ear discharge and ear pain.   Eyes:  Negative for redness and visual disturbance.  Respiratory:  Negative for chest tightness and shortness of breath.   Cardiovascular:  Negative for chest pain and leg swelling.  Genitourinary:  Negative for difficulty urinating and dysuria.  Musculoskeletal:  Negative for back pain and gait problem.  Skin:  Negative for rash.  Neurological:  Positive for headaches. Negative for dizziness and light-headedness.  Psychiatric/Behavioral:  Negative for agitation and behavioral problems.   All other systems reviewed and are negative.   Per HPI unless specifically indicated above   Allergies as of 08/16/2023   No Known Allergies      Medication List        Accurate as of August 16, 2023 11:15 AM. If you have any questions, ask your nurse or doctor.          Accu-Chek Guide test strip Generic drug: glucose blood Test BS 3 times daily Dx E11.9   Accu-Chek Guide w/Device Kit 1 each by Does not apply route 2 (two) times daily.   Accu-Chek Softclix Lancets lancets Check BS QID Dx E11.9    DULoxetine 60 MG capsule Commonly known as: CYMBALTA Take 2 capsules (120 mg total) by mouth daily.   esomeprazole 40 MG capsule Commonly known as: NexIUM Take 1 capsule (40 mg total) by mouth 2 (two) times daily before a meal.   fluticasone 50 MCG/ACT nasal spray Commonly known as: FLONASE Place 1 spray into both nostrils 2 (two) times daily as needed for allergies or rhinitis.   gabapentin 600 MG tablet Commonly known as: NEURONTIN Take 1 tablet (600 mg total) by mouth 3 (three) times daily.   insulin regular 100 units/mL injection Commonly known as: HumuLIN R Inject 0.1 mLs (10 Units total) into the skin 3 (three) times daily before meals. What changed: how much to take Changed by: Elige Radon Gradie Butrick   Insulin Syringe-Needle U-100 31G X 5/16" 0.3 ML Misc Commonly known as: BD Insulin Syringe U/F USE FOUR TIMES DAILY w/ insulin Dx E11.9   levothyroxine 125 MCG tablet Commonly known as: SYNTHROID Take 1 tablet (125 mcg total) by mouth daily.   lisinopril 40 MG tablet Commonly known as: ZESTRIL Take 1 tablet (40 mg total) by mouth daily. What changed:  medication strength how much to take Changed by: Elige Radon Garritt Molyneux   metFORMIN 500 MG tablet Commonly known as: GLUCOPHAGE Take 1 tablet (500 mg total) by mouth 2 (two) times daily with a meal. (Needs to be seen before next refill)   methocarbamol 500 MG tablet Commonly known as: ROBAXIN Take 1 tablet (500 mg total) by mouth 3 (three) times daily.   OLANZapine 15 MG tablet Commonly known as: ZYPREXA Take 1 tablet (15 mg total) by mouth at bedtime.   rosuvastatin 10 MG tablet Commonly known as: Crestor Take 1 tablet (10 mg total) by mouth daily.   Evaristo Bury FlexTouch 200 UNIT/ML FlexTouch Pen Generic drug: insulin degludec Inject 26 Units into the skin every evening. What changed: how much to take Changed by: Elige Radon Joneisha Miles         Objective:   BP (!) 152/90   Pulse 96   Ht 5\' 4"  (1.626 m)   Wt  265 lb (120.2 kg)   SpO2 95%   BMI 45.49 kg/m   Wt Readings from Last 3 Encounters:  08/16/23 265 lb (120.2 kg)  05/31/23 258 lb 12.8 oz (117.4 kg)  05/10/23 261 lb (118.4 kg)    Physical Exam Vitals and nursing note reviewed.  Constitutional:      General: She is not in acute distress.    Appearance: She is well-developed. She is obese. She is not diaphoretic.  Eyes:     Conjunctiva/sclera: Conjunctivae normal.  Cardiovascular:     Rate and Rhythm: Normal rate and regular rhythm.     Heart sounds: Normal heart sounds. No murmur heard. Pulmonary:  Effort: Pulmonary effort is normal. No respiratory distress.     Breath sounds: Normal breath sounds. No wheezing.  Musculoskeletal:        General: No swelling or tenderness. Normal range of motion.  Skin:    General: Skin is warm and dry.     Findings: No rash.  Neurological:     Mental Status: She is alert and oriented to person, place, and time.     Coordination: Coordination normal.  Psychiatric:        Behavior: Behavior normal.       Assessment & Plan:   Problem List Items Addressed This Visit       Cardiovascular and Mediastinum   Hypertension   Relevant Medications   lisinopril (ZESTRIL) 40 MG tablet   rosuvastatin (CRESTOR) 10 MG tablet     Endocrine   Hypothyroidism (acquired) - Primary   Relevant Orders   CBC with Differential/Platelet   CMP14+EGFR   Lipid panel   TSH   Bayer DCA Hb A1c Waived   Diabetes mellitus, type II, insulin dependent (HCC)   Relevant Medications   lisinopril (ZESTRIL) 40 MG tablet   metFORMIN (GLUCOPHAGE) 500 MG tablet   rosuvastatin (CRESTOR) 10 MG tablet   insulin degludec (TRESIBA FLEXTOUCH) 200 UNIT/ML FlexTouch Pen   insulin regular (HUMULIN R) 100 units/mL injection   Other Relevant Orders   CBC with Differential/Platelet   CMP14+EGFR   Lipid panel   TSH   Bayer DCA Hb A1c Waived     Other   Anxiety   Depression, recurrent (HCC)   Relevant Medications    OLANZapine (ZYPREXA) 15 MG tablet    Will increase lisinopril to 40 mg daily and will also increase Tresiba to 26 units daily and Humalog to 10 units 3 times daily.  A1c was better at 8.3 but still slightly elevated. Follow up plan: Return in about 3 months (around 11/16/2023), or if symptoms worsen or fail to improve, for Diabetes and thyroid and hypertension recheck.  Counseling provided for all of the vaccine components Orders Placed This Encounter  Procedures   CBC with Differential/Platelet   CMP14+EGFR   Lipid panel   TSH   Bayer DCA Hb A1c Waived    Arville Care, MD [place] [facility] 08/16/2023, 11:15 AM

## 2023-08-17 LAB — CMP14+EGFR
ALT: 31 [IU]/L (ref 0–32)
AST: 39 [IU]/L (ref 0–40)
Albumin: 4.2 g/dL (ref 3.8–4.9)
Alkaline Phosphatase: 123 [IU]/L — ABNORMAL HIGH (ref 44–121)
BUN/Creatinine Ratio: 15 (ref 9–23)
BUN: 10 mg/dL (ref 6–24)
Bilirubin Total: 0.7 mg/dL (ref 0.0–1.2)
CO2: 21 mmol/L (ref 20–29)
Calcium: 9.4 mg/dL (ref 8.7–10.2)
Chloride: 104 mmol/L (ref 96–106)
Creatinine, Ser: 0.67 mg/dL (ref 0.57–1.00)
Globulin, Total: 2.5 g/dL (ref 1.5–4.5)
Glucose: 152 mg/dL — ABNORMAL HIGH (ref 70–99)
Potassium: 3.9 mmol/L (ref 3.5–5.2)
Sodium: 143 mmol/L (ref 134–144)
Total Protein: 6.7 g/dL (ref 6.0–8.5)
eGFR: 103 mL/min/{1.73_m2} (ref >59)

## 2023-08-17 LAB — CBC WITH DIFFERENTIAL/PLATELET
Basophils Absolute: 0 10*3/uL (ref 0.0–0.2)
Basos: 1 %
EOS (ABSOLUTE): 0.2 10*3/uL (ref 0.0–0.4)
Eos: 2 %
Hematocrit: 42.1 % (ref 34.0–46.6)
Hemoglobin: 13.9 g/dL (ref 11.1–15.9)
Immature Grans (Abs): 0 10*3/uL (ref 0.0–0.1)
Immature Granulocytes: 1 %
Lymphocytes Absolute: 2.9 10*3/uL (ref 0.7–3.1)
Lymphs: 38 %
MCH: 30.1 pg (ref 26.6–33.0)
MCHC: 33 g/dL (ref 31.5–35.7)
MCV: 91 fL (ref 79–97)
Monocytes Absolute: 0.6 10*3/uL (ref 0.1–0.9)
Monocytes: 8 %
Neutrophils Absolute: 3.9 10*3/uL (ref 1.4–7.0)
Neutrophils: 50 %
Platelets: 240 10*3/uL (ref 150–450)
RBC: 4.62 x10E6/uL (ref 3.77–5.28)
RDW: 12.6 % (ref 11.7–15.4)
WBC: 7.6 10*3/uL (ref 3.4–10.8)

## 2023-08-17 LAB — TSH: TSH: 3.46 u[IU]/mL (ref 0.450–4.500)

## 2023-08-17 LAB — LIPID PANEL
Chol/HDL Ratio: 2.5 {ratio} (ref 0.0–4.4)
Cholesterol, Total: 104 mg/dL (ref 100–199)
HDL: 41 mg/dL (ref >39)
LDL Chol Calc (NIH): 33 mg/dL (ref 0–99)
Triglycerides: 183 mg/dL — ABNORMAL HIGH (ref 0–149)
VLDL Cholesterol Cal: 30 mg/dL (ref 5–40)

## 2023-08-20 DIAGNOSIS — Z794 Long term (current) use of insulin: Secondary | ICD-10-CM | POA: Diagnosis not present

## 2023-08-20 DIAGNOSIS — E039 Hypothyroidism, unspecified: Secondary | ICD-10-CM | POA: Diagnosis not present

## 2023-08-20 DIAGNOSIS — E1159 Type 2 diabetes mellitus with other circulatory complications: Secondary | ICD-10-CM | POA: Diagnosis not present

## 2023-08-20 DIAGNOSIS — F419 Anxiety disorder, unspecified: Secondary | ICD-10-CM | POA: Diagnosis not present

## 2023-08-20 DIAGNOSIS — I10 Essential (primary) hypertension: Secondary | ICD-10-CM | POA: Diagnosis not present

## 2023-08-20 DIAGNOSIS — F339 Major depressive disorder, recurrent, unspecified: Secondary | ICD-10-CM | POA: Diagnosis not present

## 2023-08-20 DIAGNOSIS — Z23 Encounter for immunization: Secondary | ICD-10-CM | POA: Diagnosis not present

## 2023-08-29 DIAGNOSIS — M47816 Spondylosis without myelopathy or radiculopathy, lumbar region: Secondary | ICD-10-CM | POA: Diagnosis not present

## 2023-08-29 DIAGNOSIS — M542 Cervicalgia: Secondary | ICD-10-CM | POA: Diagnosis not present

## 2023-08-29 DIAGNOSIS — M5416 Radiculopathy, lumbar region: Secondary | ICD-10-CM | POA: Diagnosis not present

## 2023-08-29 DIAGNOSIS — M5412 Radiculopathy, cervical region: Secondary | ICD-10-CM | POA: Diagnosis not present

## 2023-09-11 ENCOUNTER — Other Ambulatory Visit: Payer: Self-pay | Admitting: Family Medicine

## 2023-09-14 ENCOUNTER — Encounter: Payer: Medicare HMO | Admitting: Family Medicine

## 2023-09-19 ENCOUNTER — Telehealth: Payer: Self-pay

## 2023-09-19 ENCOUNTER — Telehealth: Payer: Self-pay | Admitting: Family Medicine

## 2023-09-19 NOTE — Telephone Encounter (Signed)
Sent to clinical pools

## 2023-09-19 NOTE — Telephone Encounter (Signed)
Copied from CRM [id number]. Topic: Clinical - Medical Advice >> Sep 19, 2023  4:07 PM CMA Thayer Jew H wrote: Sent to pools

## 2023-09-20 NOTE — Telephone Encounter (Signed)
Appt scheduled for 10/02/2023

## 2023-09-20 NOTE — Telephone Encounter (Signed)
Please call pt and schedule diabetic retinopathy exam in the office.

## 2023-10-01 ENCOUNTER — Telehealth: Payer: Self-pay

## 2023-10-01 DIAGNOSIS — M5416 Radiculopathy, lumbar region: Secondary | ICD-10-CM | POA: Diagnosis not present

## 2023-10-01 NOTE — Patient Outreach (Signed)
Attempted to contact patient regarding DM Eye. Left voicemail for patient to return my call at [phone].  Nicholes Rough, CMA [facility]

## 2023-10-02 ENCOUNTER — Ambulatory Visit: Payer: Medicare HMO

## 2023-10-03 ENCOUNTER — Ambulatory Visit (INDEPENDENT_AMBULATORY_CARE_PROVIDER_SITE_OTHER): Payer: Medicare HMO

## 2023-10-03 VITALS — Ht 64.0 in | Wt 240.0 lb

## 2023-10-03 DIAGNOSIS — Z0001 Encounter for general adult medical examination with abnormal findings: Secondary | ICD-10-CM | POA: Diagnosis not present

## 2023-10-03 DIAGNOSIS — Z0000 Encounter for general adult medical examination without abnormal findings: Secondary | ICD-10-CM

## 2023-10-03 DIAGNOSIS — Z0100 Encounter for examination of eyes and vision without abnormal findings: Secondary | ICD-10-CM

## 2023-10-03 DIAGNOSIS — Z1231 Encounter for screening mammogram for malignant neoplasm of breast: Secondary | ICD-10-CM

## 2023-10-03 DIAGNOSIS — E119 Type 2 diabetes mellitus without complications: Secondary | ICD-10-CM

## 2023-10-03 NOTE — Patient Instructions (Signed)
Tina Chapman , Thank you for taking time to come for your Medicare Wellness Visit. I appreciate your ongoing commitment to your health goals. Please review the following plan we discussed and let me know if I can assist you in the future.   Referrals/Orders/Follow-Ups/Clinician Recommendations:  Next Medicare Annual Wellness Visit: October 03, 2024 at 8:40 am virtual visit  You have been referred to [facility] for an eye exam. If you haven't heard from them in the next 7-10 business days, please call their office to schedule your appointment  [facility] [address] [place], [place] [address] Phone: [phone]  You have an order for:  []   2D Mammogram  [x]   3D Mammogram  []   Bone Density     Please call for appointment:   [facility] [phone]   Make sure to wear two-piece clothing.  No lotions powders or deodorants the day of the appointment Make sure to bring picture ID and insurance card.  Bring list of medications you are currently taking including any supplements.   Schedule your [place] screening mammogram through MyChart!   Log into your MyChart account.  Go to 'Visit' (or 'Appointments' if on mobile App) --> Schedule an Appointment  Under 'Select a Reason for Visit' choose the Mammogram Screening option.  Complete the pre-visit questions and select the time and place that best fits your schedule.    This is a list of the screening recommended for you and due dates:  Health Maintenance  Topic Date Due   Eye exam for diabetics  Never done   COVID-19 Vaccine (5 - 2023-24 season) 07/15/2023   Pap with HPV screening  10/26/2023   Mammogram  11/01/2023   Complete foot exam   02/01/2024   Hemoglobin A1C  02/14/2024   Yearly kidney health urinalysis for diabetes  05/09/2024   Yearly kidney function blood test for diabetes  08/15/2024   Medicare Annual Wellness Visit  10/02/2024   Colon Cancer Screening  02/12/2028    DTaP/Tdap/Td vaccine (3 - Td or Tdap) 03/29/2028   Flu Shot  Completed   Hepatitis C Screening  Completed   HIV Screening  Completed   Zoster (Shingles) Vaccine  Completed   HPV Vaccine  Aged Out    Advanced directives: (ACP Link)Information on Advanced Care Planning can be found at [facility] of [place] Advance Health Care Directives Advance Health Care Directives ([email or web address]   Next Medicare Annual Wellness Visit scheduled for next year: Yes  Preventive Care 45-20 Years Old, Female Preventive care refers to lifestyle choices and visits with your health care provider that can promote health and wellness. Preventive care visits are also called wellness exams. What can I expect for my preventive care visit? Counseling Your health care provider may ask you questions about your: Medical history, including: Past medical problems. Family medical history. Pregnancy history. Current health, including: Menstrual cycle. Method of birth control. Emotional well-being. Home life and relationship well-being. Sexual activity and sexual health. Lifestyle, including: Alcohol, nicotine or tobacco, and drug use. Access to firearms. Diet, exercise, and sleep habits. Work and work Astronomer. Sunscreen use. Safety issues such as seatbelt and bike helmet use. Physical exam Your health care provider will check your: Height and weight. These may be used to calculate your BMI (body mass index). BMI is a measurement that tells if you are at a healthy weight. Waist circumference. This measures the distance around your waistline. This measurement  also tells if you are at a healthy weight and may help predict your risk of certain diseases, such as type 2 diabetes and high blood pressure. Heart rate and blood pressure. Body temperature. Skin for abnormal spots. What immunizations do I need?  Vaccines are usually given at various ages, according to a schedule. Your health care provider will  recommend vaccines for you based on your age, medical history, and lifestyle or other factors, such as travel or where you work. What tests do I need? Screening Your health care provider may recommend screening tests for certain conditions. This may include: Lipid and cholesterol levels. Diabetes screening. This is done by checking your blood sugar (glucose) after you have not eaten for a while (fasting). Pelvic exam and Pap test. Hepatitis B test. Hepatitis C test. HIV (human immunodeficiency virus) test. STI (sexually transmitted infection) testing, if you are at risk. Lung cancer screening. Colorectal cancer screening. Mammogram. Talk with your health care provider about when you should start having regular mammograms. This may depend on whether you have a family history of breast cancer. BRCA-related cancer screening. This may be done if you have a family history of breast, ovarian, tubal, or peritoneal cancers. Bone density scan. This is done to screen for osteoporosis. Talk with your health care provider about your test results, treatment options, and if necessary, the need for more tests. Follow these instructions at home: Eating and drinking  Eat a diet that includes fresh fruits and vegetables, whole grains, lean protein, and low-fat dairy products. Take vitamin and mineral supplements as recommended by your health care provider. Do not drink alcohol if: Your health care provider tells you not to drink. You are pregnant, may be pregnant, or are planning to become pregnant. If you drink alcohol: Limit how much you have to 0-1 drink a day. Know how much alcohol is in your drink. In the [place] one drink equals one 12 oz bottle of beer (355 mL), one 5 oz glass of wine (148 mL), or one 1 oz glass of hard liquor (44 mL). Lifestyle Brush your teeth every morning and night with fluoride toothpaste. Floss one time each day. Exercise for at least 30 minutes 5 or more days each week. Do  not use any products that contain nicotine or tobacco. These products include cigarettes, chewing tobacco, and vaping devices, such as e-cigarettes. If you need help quitting, ask your health care provider. Do not use drugs. If you are sexually active, practice safe sex. Use a condom or other form of protection to prevent STIs. If you do not wish to become pregnant, use a form of birth control. If you plan to become pregnant, see your health care provider for a prepregnancy visit. Take aspirin only as told by your health care provider. Make sure that you understand how much to take and what form to take. Work with your health care provider to find out whether it is safe and beneficial for you to take aspirin daily. Find healthy ways to manage stress, such as: Meditation, yoga, or listening to music. Journaling. Talking to a trusted person. Spending time with friends and family. Minimize exposure to UV radiation to reduce your risk of skin cancer. Safety Always wear your seat belt while driving or riding in a vehicle. Do not drive: If you have been drinking alcohol. Do not ride with someone who has been drinking. When you are tired or distracted. While texting. If you have been using any mind-altering substances or drugs.  Wear a helmet and other protective equipment during sports activities. If you have firearms in your house, make sure you follow all gun safety procedures. Seek help if you have been physically or sexually abused. What's next? Visit your health care provider once a year for an annual wellness visit. Ask your health care provider how often you should have your eyes and teeth checked. Stay up to date on all vaccines. This information is not intended to replace advice given to you by your health care provider. Make sure you discuss any questions you have with your health care provider. Document Revised: 04/27/2021 Document Reviewed: 04/27/2021 Elsevier Patient Education  2024  [employer].

## 2023-10-03 NOTE — Progress Notes (Signed)
Because this visit was a virtual/telehealth visit,  certain criteria was not obtained, such a blood pressure, CBG if applicable, and timed get up and go. Any medications not marked as "taking" were not mentioned during the medication reconciliation part of the visit. Any vitals not documented were not able to be obtained due to this being a telehealth visit or patient was unable to self-report a recent blood pressure reading due to a lack of equipment at home via telehealth. Vitals that have been documented are verbally provided by the patient.   Subjective:   Tina Chapman is a 57 y.o. female who presents for Medicare Annual (Subsequent) preventive examination.  Visit Complete: Virtual I connected with  Tina Chapman on 10/03/23 by a audio enabled telemedicine application and verified that I am speaking with the correct person using two identifiers.  Patient Location: Home  Provider Location: Home Office  I discussed the limitations of evaluation and management by telemedicine. The patient expressed understanding and agreed to proceed.  Vital Signs: Because this visit was a virtual/telehealth visit, some criteria may be missing or patient reported. Any vitals not documented were not able to be obtained and vitals that have been documented are patient reported.  Patient Medicare AWV questionnaire was completed by the patient on na; I have confirmed that all information answered by patient is correct and no changes since this date.  Cardiac Risk Factors include: advanced age (>68men, >22 women);diabetes mellitus;dyslipidemia;hypertension;obesity (BMI >30kg/m2);sedentary lifestyle     Objective:    Today's Vitals   10/03/23 0949  Weight: 240 lb (108.9 kg)  Height: 5\' 4"  (1.626 m)   Body mass index is 41.2 kg/m.     10/03/2023    9:49 AM 02/08/2023    2:51 PM 03/21/2019    9:50 AM  Advanced Directives  Does Patient Have a Medical Advance Directive? No No No  Would patient  like information on creating a medical advance directive? No - Patient declined No - Patient declined     Current Medications (verified) Outpatient Encounter Medications as of 10/03/2023  Medication Sig   Accu-Chek Softclix Lancets lancets Check BS QID Dx E11.9   Blood Glucose Monitoring Suppl (ACCU-CHEK GUIDE) w/Device KIT 1 each by Does not apply route 2 (two) times daily.   DULoxetine (CYMBALTA) 60 MG capsule Take 2 capsules (120 mg total) by mouth daily.   esomeprazole (NEXIUM) 40 MG capsule Take 1 capsule (40 mg total) by mouth 2 (two) times daily before a meal.   fluticasone (FLONASE) 50 MCG/ACT nasal spray Place 1 spray into both nostrils 2 (two) times daily as needed for allergies or rhinitis.   gabapentin (NEURONTIN) 600 MG tablet Take 1 tablet (600 mg total) by mouth 3 (three) times daily.   glucose blood (ACCU-CHEK GUIDE) test strip Test BS 3 times daily Dx E11.9   insulin regular (HUMULIN R) 100 units/mL injection Inject 0.1 mLs (10 Units total) into the skin 3 (three) times daily before meals.   Insulin Syringe-Needle U-100 (BD INSULIN SYRINGE U/F) 31G X 5/16" 0.3 ML MISC USE FOUR TIMES DAILY w/ insulin Dx E11.9   levothyroxine (SYNTHROID) 125 MCG tablet Take 1 tablet (125 mcg total) by mouth daily.   lisinopril (ZESTRIL) 40 MG tablet Take 1 tablet (40 mg total) by mouth daily.   metFORMIN (GLUCOPHAGE) 500 MG tablet Take 1 tablet (500 mg total) by mouth 2 (two) times daily with a meal. (Needs to be seen before next refill)   methocarbamol (ROBAXIN) 500 MG tablet  Take 1 tablet (500 mg total) by mouth 3 (three) times daily.   OLANZapine (ZYPREXA) 15 MG tablet Take 1 tablet (15 mg total) by mouth at bedtime.   rosuvastatin (CRESTOR) 10 MG tablet Take 1 tablet (10 mg total) by mouth daily.   TRESIBA FLEXTOUCH 200 UNIT/ML FlexTouch Pen INJECT 24 UNITS SUBCUTANEOUSLY IN THE EVENING   No facility-administered encounter medications on file as of 10/03/2023.    Allergies  (verified) Patient has no known allergies.   History: Past Medical History:  Diagnosis Date   Anxiety    Back pain    Depression    Diabetes (HCC)    GERD (gastroesophageal reflux disease)    History of kidney stones    Hyperlipidemia    Hypertension    Hypothyroidism    Sciatic nerve pain    Sleep apnea    Past Surgical History:  Procedure Laterality Date   ABDOMINAL HYSTERECTOMY     APPENDECTOMY     BIOPSY  02/12/2023   Procedure: BIOPSY;  Surgeon: Lanelle Bal, DO;  [facility];  Service: Endoscopy;;   CERVIX SURGERY     CHOLECYSTECTOMY     COLONOSCOPY WITH ESOPHAGOGASTRODUODENOSCOPY (EGD)     in [place]   COLONOSCOPY WITH PROPOFOL N/A 02/12/2023   Procedure: COLONOSCOPY WITH PROPOFOL;  Surgeon: Lanelle Bal, DO;  [facility]: [facility];  Service: Endoscopy;  Laterality: N/A;  12:45 pm   ESOPHAGOGASTRODUODENOSCOPY (EGD) WITH PROPOFOL N/A 02/12/2023   Procedure: ESOPHAGOGASTRODUODENOSCOPY (EGD) WITH PROPOFOL;  Surgeon: Lanelle Bal, DO;  [facility];  Service: Endoscopy;  Laterality: N/A;   fatty tumor removed     NECK SURGERY  2002   has metal plate    POLYPECTOMY  02/12/2023   Procedure: POLYPECTOMY;  Surgeon: Lanelle Bal, DO;  [facility];  Service: Endoscopy;;   TONSILLECTOMY     Family History  Problem Relation Age of Onset   Early death Mother        47   Arthritis Mother    Heart disease Mother    Arthritis Father    Diabetes Paternal Uncle    Cancer Maternal Grandmother        colon, age 106   Esophageal cancer Neg Hx    Stomach cancer Neg Hx    Breast cancer Neg Hx    Social History   Socioeconomic History   Marital status: Divorced    Spouse name: Not on file   Number of children: Not on file   Years of education: Not on file   Highest education level: Not on file  Occupational History   Not on file  Tobacco Use   Smoking status: Never   Smokeless tobacco: Never  Vaping Use   Vaping status:  Never Used  Substance and Sexual Activity   Alcohol use: Never   Drug use: Never   Sexual activity: Not Currently  Other Topics Concern   Not on file  Social History Narrative   22 son 44 years old, here with her   Social Determinants of Health   Financial Resource Strain: Medium Risk (10/03/2023)   Overall Financial Resource Strain (CARDIA)    Difficulty of Paying Living Expenses: Somewhat hard  Food Insecurity: Food Insecurity Present (10/03/2023)   Hunger Vital Sign    Worried About Running Out of Food in the Last Year: Often true    Ran Out of Food in the Last Year: Sometimes true  Transportation Needs: No  Transportation Needs (10/03/2023)   PRAPARE - Administrator, Civil Service (Medical): No    Lack of Transportation (Non-Medical): No  Physical Activity: Inactive (10/03/2023)   Exercise Vital Sign    Days of Exercise per Week: 0 days    Minutes of Exercise per Session: 0 min  Stress: Stress Concern Present (10/03/2023)   [employer] of Occupational Health - Occupational Stress Questionnaire    Feeling of Stress : Very much  Social Connections: Socially Isolated (10/03/2023)   Social Connection and Isolation Panel [NHANES]    Frequency of Communication with Friends and Family: More than three times a week    Frequency of Social Gatherings with Friends and Family: More than three times a week    Attends Religious Services: Never    Database administrator or Organizations: No    Attends Engineer, structural: Never    Marital Status: Divorced    Tobacco Counseling Counseling given: Yes   Clinical Intake:  Pre-visit preparation completed: Yes  Pain : No/denies pain     BMI - recorded: 41.2 Nutritional Status: BMI > 30  Obese Nutritional Risks: None Diabetes: Yes CBG done?: No (telehealth visit.) Did pt. bring in CBG monitor from home?: No  How often do you need to have someone help you when you read instructions, pamphlets, or  other written materials from your doctor or pharmacy?: 1 - Never  Interpreter Needed?: No  Information entered by :: Maryjean Ka, CMA   Activities of Daily Living    10/03/2023   10:05 AM 02/08/2023    2:53 PM  In your present state of health, do you have any difficulty performing the following activities:  Hearing? 0   Vision? 0   Difficulty concentrating or making decisions? 0   Walking or climbing stairs? 1   Comment chronic low back pain   Dressing or bathing? 0   Doing errands, shopping? 1 0  Preparing Food and eating ? N   Using the Toilet? N   In the past six months, have you accidently leaked urine? N   Do you have problems with loss of bowel control? N   Managing your Medications? N   Managing your Finances? N   Housekeeping or managing your Housekeeping? Y   Comment due to chronic low back pain     Patient Care Team: Dettinger, Elige Radon, MD as PCP - General (Family Medicine) Lanelle Bal, DO as Consulting Physician (Gastroenterology)  Indicate any recent Medical Services you may have received from other than [facility] providers in the past year (date may be approximate).     Assessment:   This is a routine wellness examination for [place].  Hearing/Vision screen Hearing Screening - Comments:: Patient denies any hearing difficulties.   Vision Screening - Comments:: Referral placed for ophthalmology. Patient in agreement with treatment plan.    Goals Addressed             This Visit's Progress    Patient Stated       I want to feel better and not have back pain like I do now.        Depression Screen    10/03/2023    9:56 AM 08/16/2023   10:59 AM 05/10/2023    9:40 AM 02/01/2023   10:52 AM 11/03/2022    4:14 PM 08/09/2022    2:34 PM 01/06/2022    1:13 PM  PHQ 2/9 Scores  PHQ - 2 Score 3 4  4 5 6 2 5   PHQ- 9 Score 17 20 18 23 23 7 23     Fall Risk    10/03/2023   10:05 AM 08/16/2023   10:59 AM 05/10/2023    9:40 AM 02/01/2023   10:52 AM 08/09/2022     2:34 PM  Fall Risk   Falls in the past year? 0 0 0 0 0  Number falls in past yr: 0      Injury with Fall? 0      Risk for fall due to : No Fall Risks      Follow up Falls prevention discussed        MEDICARE RISK AT HOME: Medicare Risk at Home Any stairs in or around the home?: No If so, are there any without handrails?: No Home free of loose throw rugs in walkways, pet beds, electrical cords, etc?: Yes Adequate lighting in your home to reduce risk of falls?: Yes Life alert?: No Use of a cane, walker or w/c?: No Grab bars in the bathroom?: No Shower chair or bench in shower?: No Elevated toilet seat or a handicapped toilet?: No  TIMED UP AND GO:  Was the test performed?  No    Cognitive Function:        10/03/2023    9:52 AM  6CIT Screen  What Year? 0 points  What month? 0 points  What time? 0 points  Count back from 20 0 points  Months in reverse 0 points  Repeat phrase 0 points  Total Score 0 points    Immunizations Immunization History  Administered Date(s) Administered   Influenza, Seasonal, Injecte, Preservative Fre 08/20/2023   Influenza,inj,Quad PF,6+ Mos 10/25/2020, 08/19/2021, 08/09/2022, 11/03/2022   Moderna Sars-Covid-2 Vaccination 01/20/2020, 02/17/2020, 10/15/2020, 05/20/2021   Pneumococcal Conjugate-13 10/25/2020   Td 03/29/2018   Tdap 03/29/2018   Zoster Recombinant(Shingrix) 01/06/2022, 02/01/2023    TDAP status: Up to date  Flu Vaccine status: Up to date  Pneumococcal vaccine status: Not age appropriate for this patient.   Covid-19 vaccine status: Information provided on how to obtain vaccines.   Qualifies for Shingles Vaccine? No   Zostavax completed No   Shingrix Completed?: Yes  Screening Tests Health Maintenance  Topic Date Due   Medicare Annual Wellness (AWV)  Never done   OPHTHALMOLOGY EXAM  Never done   COVID-19 Vaccine (5 - 2023-24 season) 07/15/2023   Cervical Cancer Screening (HPV/Pap Cotest)  10/26/2023    MAMMOGRAM  11/01/2023   FOOT EXAM  02/01/2024   HEMOGLOBIN A1C  02/14/2024   Diabetic kidney evaluation - Urine ACR  05/09/2024   Diabetic kidney evaluation - eGFR measurement  08/15/2024   DTaP/Tdap/Td (3 - Td or Tdap) 03/29/2028   Colonoscopy  02/11/2033   INFLUENZA VACCINE  Completed   Hepatitis C Screening  Completed   HIV Screening  Completed   Zoster Vaccines- Shingrix  Completed   HPV VACCINES  Aged Out    Health Maintenance  Health Maintenance Due  Topic Date Due   Medicare Annual Wellness (AWV)  Never done   OPHTHALMOLOGY EXAM  Never done   COVID-19 Vaccine (5 - 2023-24 season) 07/15/2023   Cervical Cancer Screening (HPV/Pap Cotest)  10/26/2023    Colorectal cancer screening: Type of screening: Colonoscopy. Completed 02/12/2023. Repeat every 5 years  Mammogram status: Completed 11/01/2023. Repeat every year order entered due to upcoming due date for mammogram  Bone Density Screening: Not age appropriate for this patient.    Lung Cancer Screening: (Low  Dose CT Chest recommended if Age 65-80 years, 20 pack-year currently smoking OR have quit w/in 15years.) does not qualify.   Lung Cancer Screening Referral: na  Additional Screening:  Hepatitis C Screening: does not qualify; Completed 01/18/2023  Vision Screening: Recommended annual ophthalmology exams for early detection of glaucoma and other disorders of the eye. Is the patient up to date with their annual eye exam?  No  Who is the provider or what is the name of the office in which the patient attends annual eye exams? N/a If pt is not established with a provider, would they like to be referred to a provider to establish care? Yes .   Dental Screening: Recommended annual dental exams for proper oral hygiene  Diabetic Foot Exam: Diabetic Foot Exam: Completed 02/01/2023  Community Resource Referral / Chronic Care Management: CRR required this visit?  No   CCM required this visit?  No     Plan:     I have  personally reviewed and noted the following in the patient's chart:   Medical and social history Use of alcohol, tobacco or illicit drugs  Current medications and supplements including opioid prescriptions. Patient is not currently taking opioid prescriptions. Functional ability and status Nutritional status Physical activity Advanced directives List of other physicians Hospitalizations, surgeries, and ER visits in previous 12 months Vitals Screenings to include cognitive, depression, and falls Referrals and appointments  In addition, I have reviewed and discussed with patient certain preventive protocols, quality metrics, and best practice recommendations. A written personalized care plan for preventive services as well as general preventive health recommendations were provided to patient.     Jordan Hawks Dayvion Sans, CMA   10/03/2023   After Visit Summary: (MyChart) Due to this being a telephonic visit, the after visit summary with patients personalized plan was offered to patient via MyChart   Nurse Notes: Mammogram ordered, Ophthalmology referral placed

## 2023-10-04 ENCOUNTER — Ambulatory Visit: Payer: Medicare HMO | Admitting: *Deleted

## 2023-10-04 DIAGNOSIS — E119 Type 2 diabetes mellitus without complications: Secondary | ICD-10-CM

## 2023-10-04 DIAGNOSIS — Z794 Long term (current) use of insulin: Secondary | ICD-10-CM | POA: Diagnosis not present

## 2023-10-04 LAB — HM DIABETES EYE EXAM

## 2023-10-04 NOTE — Progress Notes (Signed)
Tina Chapman arrived 10/04/2023 and has given verbal consent to obtain images and complete their overdue diabetic retinal screening.  The images have been sent to an ophthalmologist or optometrist for review and interpretation.  Results will be sent back to Dettinger, Elige Radon, MD for review.  Patient has been informed they will be contacted when we receive the results via telephone or MyChart

## 2023-10-20 ENCOUNTER — Other Ambulatory Visit: Payer: Self-pay | Admitting: Family Medicine

## 2023-10-31 DIAGNOSIS — M5416 Radiculopathy, lumbar region: Secondary | ICD-10-CM | POA: Diagnosis not present

## 2023-10-31 DIAGNOSIS — M5412 Radiculopathy, cervical region: Secondary | ICD-10-CM | POA: Diagnosis not present

## 2023-10-31 DIAGNOSIS — M47816 Spondylosis without myelopathy or radiculopathy, lumbar region: Secondary | ICD-10-CM | POA: Diagnosis not present

## 2023-10-31 DIAGNOSIS — M542 Cervicalgia: Secondary | ICD-10-CM | POA: Diagnosis not present

## 2023-11-08 ENCOUNTER — Other Ambulatory Visit: Payer: Self-pay | Admitting: Family Medicine

## 2023-11-08 DIAGNOSIS — E039 Hypothyroidism, unspecified: Secondary | ICD-10-CM

## 2023-11-16 ENCOUNTER — Encounter: Payer: Self-pay | Admitting: Family Medicine

## 2023-11-16 ENCOUNTER — Ambulatory Visit: Payer: Medicare HMO | Admitting: Family Medicine

## 2023-11-16 VITALS — BP 105/70 | HR 112 | Ht 64.0 in | Wt 264.0 lb

## 2023-11-16 DIAGNOSIS — F339 Major depressive disorder, recurrent, unspecified: Secondary | ICD-10-CM | POA: Diagnosis not present

## 2023-11-16 DIAGNOSIS — E1159 Type 2 diabetes mellitus with other circulatory complications: Secondary | ICD-10-CM

## 2023-11-16 DIAGNOSIS — I152 Hypertension secondary to endocrine disorders: Secondary | ICD-10-CM

## 2023-11-16 DIAGNOSIS — F419 Anxiety disorder, unspecified: Secondary | ICD-10-CM

## 2023-11-16 DIAGNOSIS — I10 Essential (primary) hypertension: Secondary | ICD-10-CM | POA: Diagnosis not present

## 2023-11-16 DIAGNOSIS — E039 Hypothyroidism, unspecified: Secondary | ICD-10-CM

## 2023-11-16 DIAGNOSIS — Z794 Long term (current) use of insulin: Secondary | ICD-10-CM

## 2023-11-16 DIAGNOSIS — E119 Type 2 diabetes mellitus without complications: Secondary | ICD-10-CM | POA: Diagnosis not present

## 2023-11-16 LAB — BAYER DCA HB A1C WAIVED: HB A1C (BAYER DCA - WAIVED): 8 % — ABNORMAL HIGH (ref 4.8–5.6)

## 2023-11-16 LAB — LIPID PANEL

## 2023-11-16 MED ORDER — INSULIN REGULAR HUMAN 100 UNIT/ML IJ SOLN: 8.0000 [IU] | Freq: Three times a day (TID) | INTRAMUSCULAR | 11 refills | Status: DC

## 2023-11-16 MED ORDER — TRESIBA FLEXTOUCH 200 UNIT/ML ~~LOC~~ SOPN: 26.0000 [IU] | PEN_INJECTOR | Freq: Every day | SUBCUTANEOUS | 3 refills | Status: DC

## 2023-11-16 MED ORDER — ACCU-CHEK SOFTCLIX LANCETS MISC: 3 refills | Status: DC

## 2023-11-16 MED ORDER — FLUTICASONE PROPIONATE 50 MCG/ACT NA SUSP: 1.0000 | Freq: Two times a day (BID) | NASAL | 6 refills | Status: AC | PRN

## 2023-11-16 MED ORDER — METHOCARBAMOL 500 MG PO TABS: 500.0000 mg | ORAL_TABLET | Freq: Three times a day (TID) | ORAL | 2 refills | Status: DC

## 2023-11-16 MED ORDER — INSULIN SYRINGE-NEEDLE U-100 31G X 5/16" 0.3 ML MISC: 3 refills | Status: AC

## 2023-11-16 NOTE — Progress Notes (Addendum)
 BP 105/70   Pulse (!) 112   Ht 5' 4 (1.626 m)   Wt 264 lb (119.7 kg)   SpO2 94%   BMI 45.32 kg/m    Subjective:   Patient ID: Tina Chapman, female    DOB: [birth year], 58 y.o.   MRN: [id number]  HPI: Tina Chapman is a 58 y.o. female presenting on 11/16/2023 for Medical Management of Chronic Issues, Diabetes, and Hypothyroidism   HPI Hypertension Patient is currently on lisinopril , and their blood pressure today is 105/70. Patient denies any lightheadedness or dizziness. Patient denies headaches, blurred vision, chest pains, shortness of breath, or weakness. Denies any side effects from medication and is content with current medication.   Hypothyroidism recheck Patient is coming in for thyroid  recheck today as well. They deny any issues with hair changes or heat or cold problems or diarrhea or constipation. They deny any chest pain or palpitations. They are currently on levothyroxine  125 micrograms   Type 2 diabetes mellitus Patient comes in today for recheck of his diabetes. Patient has been currently taking Tresiba  and Humulin R  and metformin . Patient is currently on an ACE inhibitor/ARB. Patient has seen an ophthalmologist this year. Patient denies any new issues with their feet. The symptom started onset as an adult hypertension and hypothyroidism ARE RELATED TO DM   Anxiety depression recheck Patient is coming in today for anxiety depression recheck.  She currently takes Cymbalta  and then also takes gabapentin  for neuropathy.  She also takes Zyprexa .  She feels like she is doing okay with mood and anxiety and depression.  She says a lot of her feeling bad stems from her back pain which is doing a little bit better and she does take medicine to help with that and does see a doctor to do injections.    11/16/2023    4:02 PM 10/03/2023    9:56 AM 08/16/2023   10:59 AM 05/10/2023    9:40 AM 02/01/2023   10:52 AM  Depression screen PHQ 2/9  Decreased Interest 3  2 2 3    Down, Depressed, Hopeless 3 3 2 2 2   PHQ - 2 Score 6 3 4 4 5   Altered sleeping 3 3 3 2 3   Tired, decreased energy 2 3 2 3 3   Change in appetite 2 3 2 2 2   Feeling bad or failure about yourself  3 3 3 3 3   Trouble concentrating 3 2 2 2 3   Moving slowly or fidgety/restless 2 0 1 0 2  Suicidal thoughts 2  3 2 2   PHQ-9 Score 23 17 20 18 23   Difficult doing work/chores Extremely dIfficult Extremely dIfficult Very difficult Very difficult Very difficult     Relevant past medical, surgical, family and social history reviewed and updated as indicated. Interim medical history since our last visit reviewed. Allergies and medications reviewed and updated.  Review of Systems  Constitutional:  Negative for chills and fever.  HENT:  Negative for congestion, ear discharge and ear pain.   Eyes:  Negative for redness and visual disturbance.  Respiratory:  Negative for chest tightness and shortness of breath.   Cardiovascular:  Negative for chest pain and leg swelling.  Genitourinary:  Negative for difficulty urinating and dysuria.  Musculoskeletal:  Positive for arthralgias, back pain and myalgias. Negative for gait problem.  Skin:  Negative for rash.  Neurological:  Negative for dizziness, light-headedness and headaches.  Psychiatric/Behavioral:  Negative for agitation and behavioral problems.   All other systems reviewed and  are negative.   Per HPI unless specifically indicated above   Allergies as of 11/16/2023   No Known Allergies      Medication List        Accurate as of November 16, 2023  4:17 PM. If you have any questions, ask your nurse or doctor.          Accu-Chek Guide test strip Generic drug: glucose blood Test BS 3 times daily Dx E11.9   Accu-Chek Guide w/Device Kit 1 each by Does not apply route 2 (two) times daily.   Accu-Chek Softclix Lancets lancets Check BS QID Dx E11.9   DULoxetine  60 MG capsule Commonly known as: CYMBALTA  Take 2 capsules (120 mg total) by  mouth daily.   esomeprazole  40 MG capsule Commonly known as: NexIUM  Take 1 capsule (40 mg total) by mouth 2 (two) times daily before a meal.   fluticasone  50 MCG/ACT nasal spray Commonly known as: FLONASE  Place 1 spray into both nostrils 2 (two) times daily as needed for allergies or rhinitis.   gabapentin  600 MG tablet Commonly known as: NEURONTIN  Take 1 tablet (600 mg total) by mouth 3 (three) times daily.   insulin  regular 100 units/mL injection Commonly known as: HumuLIN R  Inject 0.08-0.12 mLs (8-12 Units total) into the skin 3 (three) times daily before meals. What changed: how much to take Changed by: Fonda LABOR Blythe Veach   Insulin  Syringe-Needle U-100 31G X 5/16 0.3 ML Misc Commonly known as: BD Insulin  Syringe U/F USE FOUR TIMES DAILY w/ insulin  Dx E11.9   levothyroxine  125 MCG tablet Commonly known as: SYNTHROID  Take 1 tablet by mouth once daily   lisinopril  40 MG tablet Commonly known as: ZESTRIL  Take 1 tablet (40 mg total) by mouth daily.   metFORMIN  500 MG tablet Commonly known as: GLUCOPHAGE  Take 1 tablet (500 mg total) by mouth 2 (two) times daily with a meal. (Needs to be seen before next refill)   methocarbamol  500 MG tablet Commonly known as: ROBAXIN  Take 1 tablet (500 mg total) by mouth 3 (three) times daily.   OLANZapine  15 MG tablet Commonly known as: ZYPREXA  Take 1 tablet (15 mg total) by mouth at bedtime.   rosuvastatin  10 MG tablet Commonly known as: Crestor  Take 1 tablet (10 mg total) by mouth daily.   Tresiba  FlexTouch 200 UNIT/ML FlexTouch Pen Generic drug: insulin  degludec Inject 26 Units into the skin at bedtime. What changed: See the new instructions. Changed by: Fonda LABOR Maribella Kuna         Objective:   BP 105/70   Pulse (!) 112   Ht 5' 4 (1.626 m)   Wt 264 lb (119.7 kg)   SpO2 94%   BMI 45.32 kg/m   Wt Readings from Last 3 Encounters:  11/16/23 264 lb (119.7 kg)  10/03/23 240 lb (108.9 kg)  08/16/23 265 lb (120.2 kg)     Physical Exam Vitals and nursing note reviewed.  Constitutional:      General: She is not in acute distress.    Appearance: She is well-developed. She is not diaphoretic.  Eyes:     Conjunctiva/sclera: Conjunctivae normal.  Cardiovascular:     Rate and Rhythm: Normal rate and regular rhythm.     Heart sounds: Normal heart sounds. No murmur heard. Pulmonary:     Effort: Pulmonary effort is normal. No respiratory distress.     Breath sounds: Normal breath sounds. No wheezing.  Musculoskeletal:        General: No swelling. Normal range  of motion.  Skin:    General: Skin is warm and dry.     Findings: No rash.  Neurological:     Mental Status: She is alert and oriented to person, place, and time.     Coordination: Coordination normal.  Psychiatric:        Behavior: Behavior normal.     Results for orders placed or performed in visit on 10/24/23  HM DIABETES EYE EXAM   Collection Time: 10/04/23 12:00 AM  Result Value Ref Range   HM Diabetic Eye Exam No Retinopathy No Retinopathy    Assessment & Plan:   Problem List Items Addressed This Visit       Cardiovascular and Mediastinum   Hypertension associated with type 2 diabetes mellitus (HCC)   Relevant Medications   TRESIBA  FLEXTOUCH 200 UNIT/ML FlexTouch Pen   insulin  regular (HUMULIN R ) 100 units/mL injection   Other Relevant Orders   CBC with Differential/Platelet (Completed)   CMP14+EGFR (Completed)   Lipid panel (Completed)   TSH (Completed)   Bayer DCA Hb A1c Waived (Completed)     Endocrine   Hypothyroidism (acquired)   Relevant Orders   CBC with Differential/Platelet (Completed)   CMP14+EGFR (Completed)   Lipid panel (Completed)   TSH (Completed)   Bayer DCA Hb A1c Waived (Completed)   Diabetes mellitus, type II, insulin  dependent (HCC) - Primary   Relevant Medications   Accu-Chek Softclix Lancets lancets   Insulin  Syringe-Needle U-100 (BD INSULIN  SYRINGE U/F) 31G X 5/16 0.3 ML MISC   TRESIBA   FLEXTOUCH 200 UNIT/ML FlexTouch Pen   insulin  regular (HUMULIN R ) 100 units/mL injection   Other Relevant Orders   CBC with Differential/Platelet (Completed)   CMP14+EGFR (Completed)   Lipid panel (Completed)   TSH (Completed)   Bayer DCA Hb A1c Waived (Completed)     Other   Anxiety   Depression, recurrent (HCC)  Increase Tresiba  to 26 units nightly and increase the Novolin R to take 8 in the morning and 8 at dinnertime but take 12 at lunchtime.    Follow up plan: Return in about 3 months (around 02/14/2024), or if symptoms worsen or fail to improve, for Diabetes and hypertension and hypothyroidism also needs Pap smear and physical in the future.  Counseling provided for all of the vaccine components Orders Placed This Encounter  Procedures   CBC with Differential/Platelet   CMP14+EGFR   Lipid panel   TSH   Bayer DCA Hb A1c Waived    Fonda Levins, MD [facility] 11/16/2023, 4:17 PM

## 2023-11-17 LAB — CBC WITH DIFFERENTIAL/PLATELET
Basophils Absolute: 0.1 10*3/uL (ref 0.0–0.2)
Basos: 1 %
EOS (ABSOLUTE): 0 10*3/uL (ref 0.0–0.4)
Eos: 0 %
Hematocrit: 41.7 % (ref 34.0–46.6)
Hemoglobin: 13.8 g/dL (ref 11.1–15.9)
Immature Grans (Abs): 0 10*3/uL (ref 0.0–0.1)
Immature Granulocytes: 0 %
Lymphocytes Absolute: 2.4 10*3/uL (ref 0.7–3.1)
Lymphs: 31 %
MCH: 29.6 pg (ref 26.6–33.0)
MCHC: 33.1 g/dL (ref 31.5–35.7)
MCV: 89 fL (ref 79–97)
Monocytes Absolute: 0.5 10*3/uL (ref 0.1–0.9)
Monocytes: 7 %
Neutrophils Absolute: 4.8 10*3/uL (ref 1.4–7.0)
Neutrophils: 61 %
Platelets: 234 10*3/uL (ref 150–450)
RBC: 4.67 x10E6/uL (ref 3.77–5.28)
RDW: 12.1 % (ref 11.7–15.4)
WBC: 7.7 10*3/uL (ref 3.4–10.8)

## 2023-11-17 LAB — CMP14+EGFR
ALT: 43 IU/L — ABNORMAL HIGH (ref 0–32)
AST: 51 IU/L — ABNORMAL HIGH (ref 0–40)
Albumin: 4.2 g/dL (ref 3.8–4.9)
Alkaline Phosphatase: 149 IU/L — ABNORMAL HIGH (ref 44–121)
BUN/Creatinine Ratio: 16 (ref 9–23)
BUN: 13 mg/dL (ref 6–24)
Bilirubin Total: 0.7 mg/dL (ref 0.0–1.2)
CO2: 23 mmol/L (ref 20–29)
Calcium: 9.5 mg/dL (ref 8.7–10.2)
Chloride: 101 mmol/L (ref 96–106)
Creatinine, Ser: 0.82 mg/dL (ref 0.57–1.00)
Globulin, Total: 2.6 g/dL (ref 1.5–4.5)
Glucose: 297 mg/dL — ABNORMAL HIGH (ref 70–99)
Potassium: 4.6 mmol/L (ref 3.5–5.2)
Sodium: 140 mmol/L (ref 134–144)
Total Protein: 6.8 g/dL (ref 6.0–8.5)
eGFR: 84 mL/min/{1.73_m2} (ref >59)

## 2023-11-17 LAB — TSH: TSH: 6.39 u[IU]/mL — ABNORMAL HIGH (ref 0.450–4.500)

## 2023-11-17 LAB — LIPID PANEL
Cholesterol, Total: 114 mg/dL (ref 100–199)
HDL: 37 mg/dL — ABNORMAL LOW (ref >39)
LDL CALC COMMENT:: 3.1 ratio (ref 0.0–4.4)
LDL Chol Calc (NIH): 42 mg/dL (ref 0–99)
Triglycerides: 220 mg/dL — ABNORMAL HIGH (ref 0–149)
VLDL Cholesterol Cal: 35 mg/dL (ref 5–40)

## 2023-11-19 ENCOUNTER — Telehealth: Payer: Self-pay | Admitting: Family Medicine

## 2023-11-19 ENCOUNTER — Inpatient Hospital Stay: Admission: RE | Admit: 2023-11-19 | Payer: Medicare HMO | Source: Ambulatory Visit

## 2023-11-19 NOTE — Telephone Encounter (Signed)
 Copied from CRM [id number]. Topic: Appointments - Scheduling Inquiry for Clinic >> Nov 19, 2023  9:19 AM Tina Chapman wrote: Reason for CRM: patient needs to reschedule mammogram

## 2023-11-19 NOTE — Telephone Encounter (Signed)
 Rescheduled mammogram for 01/22

## 2023-11-23 ENCOUNTER — Other Ambulatory Visit: Payer: Self-pay

## 2023-11-23 MED ORDER — LEVOTHYROXINE SODIUM 137 MCG PO TABS: 137.0000 ug | ORAL_TABLET | Freq: Every day | ORAL | 1 refills | Status: DC

## 2023-11-27 ENCOUNTER — Other Ambulatory Visit: Payer: Self-pay | Admitting: Family Medicine

## 2023-11-27 DIAGNOSIS — E119 Type 2 diabetes mellitus without complications: Secondary | ICD-10-CM

## 2023-11-27 NOTE — Telephone Encounter (Signed)
 Copied from CRM [id number]. Topic: Clinical - Medication Refill >> Nov 27, 2023  4:18 PM Alfonso ORN wrote: Most Recent Primary Care Visit:  Provider: MARYANNE CHEW A  Department: [facility]  Visit Type: OFFICE VISIT  Date: 11/16/2023  Medication: TRESIBA  FLEXTOUCH 200 UNIT/ML FlexTouch Pen  Has the patient contacted their pharmacy? Yes ,do not have any more refills waiting for the provider  (Agent: If no, request that the patient contact the pharmacy for the refill. If patient does not wish to contact the pharmacy document the reason why and proceed with request.) (Agent: If yes, when and what did the pharmacy advise?)  Is this the correct pharmacy for this prescription? Yes If no, delete pharmacy and type the correct one.  This is the patient's preferred pharmacy:  [facility] [address] [place] - [address] [place] [address] Phone: [phone] Fax: [phone]   Has the prescription been filled recently? No  Is the patient out of the medication? No will be out in 2 days    Has the patient been seen for an appointment in the last year OR does the patient have an upcoming appointment? Yes  Can we respond through MyChart? Yes  Agent: Please be advised that Rx refills may take up to 3 business days. We ask that you follow-up with your pharmacy.

## 2023-11-30 ENCOUNTER — Encounter: Payer: Self-pay | Admitting: Gastroenterology

## 2023-11-30 ENCOUNTER — Ambulatory Visit (INDEPENDENT_AMBULATORY_CARE_PROVIDER_SITE_OTHER): Payer: Medicare HMO | Admitting: Gastroenterology

## 2023-11-30 VITALS — BP 112/75 | HR 96 | Temp 98.7°F | Ht 64.0 in | Wt 262.8 lb

## 2023-11-30 DIAGNOSIS — K219 Gastro-esophageal reflux disease without esophagitis: Secondary | ICD-10-CM

## 2023-11-30 DIAGNOSIS — K760 Fatty (change of) liver, not elsewhere classified: Secondary | ICD-10-CM | POA: Insufficient documentation

## 2023-11-30 NOTE — Patient Instructions (Signed)
You have fatty liver disease which causes inflammation in the liver and can result in scarring and cirrhosis. It is very important to try to reduce your weight, control your diabetes, control your cholesterol, be as physically active as you can. Cut back on sweets, fatty foods. Do not eat raw seafood. Try drinking 2 cups of black coffee daily to reduce inflammation in your liver.  You can drink decaf if needed. Continue your acid reflux medication, esomeprazole 40 mg once to twice daily before a meal. I we will be in touch with you regarding possible further testing of your liver or starting new medication to reduce inflammation/fibrosis once have spoken to Dr. Marletta Lor.

## 2023-11-30 NOTE — Progress Notes (Unsigned)
GI Office Note    Referring Provider: Dettinger, Elige Radon, MD Primary Care Physician:  Dettinger, Elige Radon, MD  Primary Gastroenterologist: Hennie Duos. Marletta Lor, DO   Chief Complaint   Chief Complaint  Patient presents with   Follow-up    Doing well, no issues    History of Present Illness   Tina Chapman is a 58 y.o. female presenting today for follow up.  Patient last seen in July 2024.  She has a history of chronic GERD with dysphagia, adenomatous colon polyps, elevated alkaline phosphatase with negative AMA, normal GGT.  Ultrasound with evidence of fatty liver and splenomegaly in May 2024.  Today: BM bristol 5-6. Most days one stool daily, sometimes 3-4 per day. No melena, brbpr. No abdominal pain. Reflux well controlled. No N/V.    Abd u/s 03/2023: IMPRESSION: 1. The gallbladder is surgically absent. 2. Diffuse increased echogenicity throughout the liver is nonspecific and may be seen with hepatic steatosis or other underlying intrinsic liver disease. 3. Splenomegaly with a volume of 531 cc. 4. The proximal abdominal aorta and bifurcation were obscured by shadowing bowel gas.   Colonoscopy 02/2023:  -three 4-5 mm polyps in sigmoid colon and descending colon -moderate semi-liquid stool entire colon, lavage resulting in fair visualization, recommending colonoscopy in 5 years -tubular adenoma  EGD 02/2023: -z-line irregular, esophageal biopsies c/w reflux -mild chronic gastritis, negative for H.pylori  Wt Readings from Last 10 Encounters:  11/30/23 262 lb 12.8 oz (119.2 kg)  11/16/23 264 lb (119.7 kg)  10/03/23 240 lb (108.9 kg)  08/16/23 265 lb (120.2 kg)  05/31/23 258 lb 12.8 oz (117.4 kg)  05/10/23 261 lb (118.4 kg)  02/12/23 246 lb 14.6 oz (112 kg)  02/08/23 247 lb (112 kg)  02/01/23 247 lb (112 kg)  01/17/23 246 lb 6.4 oz (111.8 kg)     Medications   Current Outpatient Medications  Medication Sig Dispense Refill   Accu-Chek Softclix Lancets  lancets Check BS QID Dx E11.9 400 each 3   Blood Glucose Monitoring Suppl (ACCU-CHEK GUIDE) w/Device KIT 1 each by Does not apply route 2 (two) times daily. 1 kit 0   DULoxetine (CYMBALTA) 60 MG capsule Take 2 capsules (120 mg total) by mouth daily. 180 capsule 3   esomeprazole (NEXIUM) 40 MG capsule Take 1 capsule (40 mg total) by mouth 2 (two) times daily before a meal. 60 capsule 11   fluticasone (FLONASE) 50 MCG/ACT nasal spray Place 1 spray into both nostrils 2 (two) times daily as needed for allergies or rhinitis. 16 g 6   gabapentin (NEURONTIN) 600 MG tablet Take 1 tablet (600 mg total) by mouth 3 (three) times daily. 270 tablet 3   glucose blood (ACCU-CHEK GUIDE) test strip Test BS 3 times daily Dx E11.9 300 each 3   insulin regular (HUMULIN R) 100 units/mL injection Inject 0.08-0.12 mLs (8-12 Units total) into the skin 3 (three) times daily before meals. 10 mL 11   Insulin Syringe-Needle U-100 (BD INSULIN SYRINGE U/F) 31G X 5/16" 0.3 ML MISC USE FOUR TIMES DAILY w/ insulin Dx E11.9 400 each 3   levothyroxine (SYNTHROID) 137 MCG tablet Take 1 tablet (137 mcg total) by mouth daily before breakfast. 90 tablet 1   lisinopril (ZESTRIL) 40 MG tablet Take 1 tablet (40 mg total) by mouth daily. 90 tablet 3   metFORMIN (GLUCOPHAGE) 500 MG tablet Take 1 tablet (500 mg total) by mouth 2 (two) times daily with a meal. (Needs to be seen  before next refill) 180 tablet 3   methocarbamol (ROBAXIN) 500 MG tablet Take 1 tablet (500 mg total) by mouth 3 (three) times daily. 90 tablet 2   OLANZapine (ZYPREXA) 15 MG tablet Take 1 tablet (15 mg total) by mouth at bedtime. 90 tablet 3   rosuvastatin (CRESTOR) 10 MG tablet Take 1 tablet (10 mg total) by mouth daily. 90 tablet 3   TRESIBA FLEXTOUCH 200 UNIT/ML FlexTouch Pen Inject 26 Units into the skin at bedtime. 15 mL 3   No current facility-administered medications for this visit.    Allergies   Allergies as of 11/30/2023   (No Known Allergies)         Review of Systems   General: Negative for anorexia, weight loss, fever, chills, fatigue, weakness. ENT: Negative for hoarseness, difficulty swallowing , nasal congestion. CV: Negative for chest pain, angina, palpitations, dyspnea on exertion, peripheral edema.  Respiratory: Negative for dyspnea at rest, dyspnea on exertion, cough, sputum, wheezing.  GI: See history of present illness. GU:  Negative for dysuria, hematuria, urinary incontinence, urinary frequency, nocturnal urination.  Endo: Negative for unusual weight change.     Physical Exam   BP 112/75 (BP Location: Right Arm, Patient Position: Sitting, Cuff Size: Large)   Pulse 96   Temp 98.7 F (37.1 C) (Oral)   Ht 5\' 4"  (1.626 m)   Wt 262 lb 12.8 oz (119.2 kg)   SpO2 95%   BMI 45.11 kg/m    General: Well-nourished, well-developed in no acute distress.  Eyes: No icterus. Mouth: Oropharyngeal mucosa moist and pink   Lungs: Clear to auscultation bilaterally.  Heart: Regular rate and rhythm, no murmurs rubs or gallops.  Abdomen: Bowel sounds are normal, nontender, nondistended, no hepatosplenomegaly or masses,  no abdominal bruits or hernia , no rebound or guarding.  Rectal: not performed  Extremities: No lower extremity edema. No clubbing or deformities. Neuro: Alert and oriented x 4   Skin: Warm and dry, no jaundice.   Psych: Alert and cooperative, normal mood and affect.  Labs   Lab Results  Component Value Date   TSH 6.390 (H) 11/16/2023   Lab Results  Component Value Date   ALT 43 (H) 11/16/2023   AST 51 (H) 11/16/2023   GGT 26 03/13/2023   ALKPHOS 149 (H) 11/16/2023   BILITOT 0.7 11/16/2023   Lab Results  Component Value Date   WBC 7.7 11/16/2023   HGB 13.8 11/16/2023   HCT 41.7 11/16/2023   MCV 89 11/16/2023   PLT 234 11/16/2023   Lab Results  Component Value Date   HGBA1C 8.0 (H) 11/16/2023   Lab Results  Component Value Date   NA 140 11/16/2023   CL 101 11/16/2023   K 4.6 11/16/2023    CO2 23 11/16/2023   BUN 13 11/16/2023   CREATININE 0.82 11/16/2023   EGFR 84 11/16/2023   CALCIUM 9.5 11/16/2023   ALBUMIN 4.2 11/16/2023   GLUCOSE 297 (H) 11/16/2023    Imaging Studies   No results found.  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 Normal GGT. Fatty liver on U/S. Previous serological work up negative. Splenomegaly noted on U/S.  -NAFLD fibrosis score of 1.13 c/w F3-F4.  -recommend slow gradual weight loss -low fat/low cholesterol/low sugar diet -exercise daily as tolerated -limit/avoid etoh -she may not be a candidate for Rezdiffra as her liver disease may be too advanced, to discuss with Dr. Marletta Lor  GERD: well controlled -continue esomeprazole  40mg  once to twice daily before a meal     Leanna Battles. Melvyn Neth, MHS, PA-C [facility]

## 2023-12-05 ENCOUNTER — Inpatient Hospital Stay: Admission: RE | Admit: 2023-12-05 | Payer: Medicare HMO | Source: Ambulatory Visit

## 2023-12-24 ENCOUNTER — Ambulatory Visit
Admission: RE | Admit: 2023-12-24 | Discharge: 2023-12-24 | Disposition: A | Discharge status: Home / Self Care | Payer: Medicare HMO | Source: Ambulatory Visit | Attending: Family Medicine | Admitting: Family Medicine

## 2023-12-24 DIAGNOSIS — Z1231 Encounter for screening mammogram for malignant neoplasm of breast: Secondary | ICD-10-CM

## 2023-12-24 DIAGNOSIS — Z0000 Encounter for general adult medical examination without abnormal findings: Secondary | ICD-10-CM

## 2024-01-15 DIAGNOSIS — M5416 Radiculopathy, lumbar region: Secondary | ICD-10-CM | POA: Diagnosis not present

## 2024-01-17 ENCOUNTER — Other Ambulatory Visit: Payer: Self-pay | Admitting: Family Medicine

## 2024-01-23 ENCOUNTER — Ambulatory Visit: Payer: Self-pay | Admitting: Family Medicine

## 2024-01-23 DIAGNOSIS — E860 Dehydration: Secondary | ICD-10-CM | POA: Diagnosis not present

## 2024-01-23 DIAGNOSIS — E1165 Type 2 diabetes mellitus with hyperglycemia: Secondary | ICD-10-CM | POA: Diagnosis not present

## 2024-01-23 DIAGNOSIS — N3000 Acute cystitis without hematuria: Secondary | ICD-10-CM | POA: Diagnosis not present

## 2024-01-23 DIAGNOSIS — Z79890 Hormone replacement therapy (postmenopausal): Secondary | ICD-10-CM | POA: Diagnosis not present

## 2024-01-23 DIAGNOSIS — Z794 Long term (current) use of insulin: Secondary | ICD-10-CM | POA: Diagnosis not present

## 2024-01-23 DIAGNOSIS — K219 Gastro-esophageal reflux disease without esophagitis: Secondary | ICD-10-CM | POA: Diagnosis not present

## 2024-01-23 DIAGNOSIS — I10 Essential (primary) hypertension: Secondary | ICD-10-CM | POA: Diagnosis not present

## 2024-01-23 DIAGNOSIS — Z79899 Other long term (current) drug therapy: Secondary | ICD-10-CM | POA: Diagnosis not present

## 2024-01-23 DIAGNOSIS — E079 Disorder of thyroid, unspecified: Secondary | ICD-10-CM | POA: Diagnosis not present

## 2024-01-23 NOTE — Telephone Encounter (Signed)
  Chief Complaint: high blood sugar Symptoms: blood sugar 506 Frequency: just prior to call in Pertinent Negatives: Patient denies all symptoms other than weakness. Disposition: [x] ED /[] Urgent Care (no appt availability in office) / [] Appointment(In office/virtual)/ []  [place] [facility]/ [] Home Care/ [] Refused Recommended Disposition /[] [place] [facility]/ []  Follow-up with PCP Additional Notes:  Patient calling in with high blood sugar 506 and weakness, no other symptoms. Emergency room advised, patient refuses ER and would like Dr. Louanne Skye to allow her to take additional insulin. Called to [facility] spoke with nurse who advises emergency room. Resumed call with patient and advised ER she states she will call her daughter in law to bring her to hospital, Edwin Cap states she will call back for assistance to call EMS if daughter in law is unable to transport to hospital.    Copied from CRM [id number]. Topic: Clinical - Red Word Triage >> Jan 23, 2024  2:20 PM Turkey B wrote: [facility] that prompted transfer to Nurse Triage: pt blood sugar is 506 Reason for Disposition  Blood glucose > 500 mg/dL (09.8 mmol/L)  Answer Assessment - Initial Assessment Questions 1. BLOOD GLUCOSE: "What is your blood glucose level?"      506 2. ONSET: "When did you check the blood glucose?"     Just prior to calling in 3. USUAL RANGE: "What is your glucose level usually?" (e.g., usual fasting morning value, usual evening value)     Baseline is 130  4. INSULIN: "Do you take insulin?" "What type of insulin(s) do you use? What is the mode of delivery? (syringe, pen; injection or pump)?"      Insulin, taking as prescribed   5 OTHER SYMPTOMS: "Do you have any symptoms?" (e.g., fever, frequent urination, difficulty breathing, dizziness, weakness, vomiting)     Feels weakness  Protocols used: Diabetes - High Blood Sugar-A-AH

## 2024-01-24 ENCOUNTER — Telehealth: Payer: Self-pay

## 2024-01-24 NOTE — Transitions of Care (Post Inpatient/ED Visit) (Signed)
   01/24/2024  Name: Tina Chapman MRN: [id number] DOB: 25-Apr-1966  Today's TOC FU Call Status: Today's TOC FU Call Status:: Unsuccessful Call (1st Attempt)  Attempted to reach the patient regarding the most recent Inpatient/ED visit.  Follow Up Plan: Additional outreach attempts will be made to reach the patient to complete the Transitions of Care (Post Inpatient/ED visit) call.   Signature Karena Addison, LPN [facility] Nurse Health Advisor Direct Dial [phone]

## 2024-01-31 NOTE — Transitions of Care (Post Inpatient/ED Visit) (Signed)
   01/31/2024  Name: Tina Chapman MRN: [id number] DOB: December 03, 1965  Today's TOC FU Call Status: Today's TOC FU Call Status:: Unsuccessful Call (3rd Attempt) Unsuccessful Call (2nd Attempt) Date: 01/31/24 Unsuccessful Call (3rd Attempt) Date: 01/31/24  Attempted to reach the patient regarding the most recent Inpatient/ED visit.  Follow Up Plan: No further outreach attempts will be made at this time. We have been unable to contact the patient.  Signature Karena Addison, LPN [facility] Nurse Health Advisor Direct Dial [phone]

## 2024-01-31 NOTE — Transitions of Care (Post Inpatient/ED Visit) (Signed)
   01/31/2024  Name: Tina Chapman MRN: [id number] DOB: 1966/10/12  Today's TOC FU Call Status: Today's TOC FU Call Status:: Unsuccessful Call (2nd Attempt) Unsuccessful Call (2nd Attempt) Date: 01/31/24  Attempted to reach the patient regarding the most recent Inpatient/ED visit.  Follow Up Plan: Additional outreach attempts will be made to reach the patient to complete the Transitions of Care (Post Inpatient/ED visit) call.   Signature Karena Addison, LPN [facility] Nurse Health Advisor Direct Dial [phone]

## 2024-02-11 ENCOUNTER — Encounter: Payer: Self-pay | Admitting: Family Medicine

## 2024-02-11 ENCOUNTER — Ambulatory Visit (INDEPENDENT_AMBULATORY_CARE_PROVIDER_SITE_OTHER): Admitting: Family Medicine

## 2024-02-11 VITALS — BP 136/96 | HR 94 | Ht 64.0 in | Wt 259.0 lb

## 2024-02-11 DIAGNOSIS — E119 Type 2 diabetes mellitus without complications: Secondary | ICD-10-CM

## 2024-02-11 DIAGNOSIS — I152 Hypertension secondary to endocrine disorders: Secondary | ICD-10-CM

## 2024-02-11 DIAGNOSIS — Z794 Long term (current) use of insulin: Secondary | ICD-10-CM | POA: Diagnosis not present

## 2024-02-11 DIAGNOSIS — F339 Major depressive disorder, recurrent, unspecified: Secondary | ICD-10-CM

## 2024-02-11 DIAGNOSIS — E1159 Type 2 diabetes mellitus with other circulatory complications: Secondary | ICD-10-CM | POA: Diagnosis not present

## 2024-02-11 DIAGNOSIS — E039 Hypothyroidism, unspecified: Secondary | ICD-10-CM | POA: Diagnosis not present

## 2024-02-11 MED ORDER — TRESIBA FLEXTOUCH 200 UNIT/ML ~~LOC~~ SOPN: 26.0000 [IU] | PEN_INJECTOR | Freq: Every day | SUBCUTANEOUS | 3 refills | Status: DC

## 2024-02-11 MED ORDER — DULOXETINE HCL 60 MG PO CPEP: 120.0000 mg | ORAL_CAPSULE | Freq: Every day | ORAL | 3 refills | Status: AC

## 2024-02-11 NOTE — Progress Notes (Signed)
 BP (!) 136/96   Pulse 94   Ht 5\' 4"  (1.626 m)   Wt 259 lb (117.5 kg)   BMI 44.46 kg/m    Subjective:   Patient ID: Tina Chapman, female    DOB: [birth year], 58 y.o.   MRN: [id number]  HPI: Tina Chapman is a 58 y.o. female presenting on 02/11/2024 for Hospitalization Follow-up (Elevated blood sugar)   HPI Type 2 diabetes mellitus Patient comes in today for recheck of his diabetes. Patient has been currently taking insulin regular and metformin and [place]. Patient is currently on an ACE inhibitor/ARB. Patient has not seen an ophthalmologist this year. Patient denies any new issues with their feet. The symptom started onset as an adult hypertension and hypothyroidism ARE RELATED TO DM.  Patient is coming in for hospital follow-up for elevated blood sugars.  She went to the emergency department because her blood sugar is running over 500 and it was in the evening.  A evaluation on her and just said that they likely need to adjust medicines although she said she had a lot of stressors in family issues and arguments that occurred that day and since then looks like her blood sugars are running pretty good.  Hypertension Patient is currently on lisinopril, and their blood pressure today is 136/96. Patient denies any lightheadedness or dizziness. Patient denies headaches, blurred vision, chest pains, shortness of breath, or weakness. Denies any side effects from medication and is content with current medication.   Hypothyroidism recheck Patient is coming in for thyroid recheck today as well. They deny any issues with hair changes or heat or cold problems or diarrhea or constipation. They deny any chest pain or palpitations. They are currently on levothyroxine 137 micrograms   Depression and anxiety recheck Patient is coming in today for depression and anxiety recheck and currently takes Cymbalta and olanzapine.  Relevant past medical, surgical, family and social history reviewed and  updated as indicated. Interim medical history since our last visit reviewed. Allergies and medications reviewed and updated.  Review of Systems  Constitutional:  Negative for chills and fever.  Eyes:  Negative for visual disturbance.  Respiratory:  Negative for chest tightness and shortness of breath.   Cardiovascular:  Negative for chest pain and leg swelling.  Genitourinary:  Negative for difficulty urinating and dysuria.  Musculoskeletal:  Negative for back pain and gait problem.  Skin:  Negative for rash.  Neurological:  Negative for dizziness, light-headedness and headaches.  Psychiatric/Behavioral:  Negative for agitation and behavioral problems.   All other systems reviewed and are negative.   Per HPI unless specifically indicated above   Allergies as of 02/11/2024   No Known Allergies      Medication List        Accurate as of February 11, 2024  2:08 PM. If you have any questions, ask your nurse or doctor.          Accu-Chek Guide Test test strip Generic drug: glucose blood Check BS QID Dx E11.9   Accu-Chek Guide w/Device Kit 1 each by Does not apply route 2 (two) times daily.   Accu-Chek Softclix Lancets lancets Check BS QID Dx E11.9   DULoxetine 60 MG capsule Commonly known as: CYMBALTA Take 2 capsules (120 mg total) by mouth daily.   esomeprazole 40 MG capsule Commonly known as: NexIUM Take 1 capsule (40 mg total) by mouth 2 (two) times daily before a meal.   fluticasone 50 MCG/ACT nasal spray Commonly known as: FLONASE Place  1 spray into both nostrils 2 (two) times daily as needed for allergies or rhinitis.   gabapentin 600 MG tablet Commonly known as: NEURONTIN Take 1 tablet (600 mg total) by mouth 3 (three) times daily.   insulin regular 100 units/mL injection Commonly known as: HumuLIN R Inject 0.08-0.12 mLs (8-12 Units total) into the skin 3 (three) times daily before meals.   Insulin Syringe-Needle U-100 31G X 5/16" 0.3 ML Misc Commonly  known as: BD Insulin Syringe U/F USE FOUR TIMES DAILY w/ insulin Dx E11.9   levothyroxine 137 MCG tablet Commonly known as: SYNTHROID Take 1 tablet (137 mcg total) by mouth daily before breakfast.   lisinopril 40 MG tablet Commonly known as: ZESTRIL Take 1 tablet (40 mg total) by mouth daily.   metFORMIN 500 MG tablet Commonly known as: GLUCOPHAGE Take 1 tablet (500 mg total) by mouth 2 (two) times daily with a meal. (Needs to be seen before next refill)   methocarbamol 500 MG tablet Commonly known as: ROBAXIN Take 1 tablet (500 mg total) by mouth 3 (three) times daily.   OLANZapine 15 MG tablet Commonly known as: ZYPREXA Take 1 tablet (15 mg total) by mouth at bedtime.   rosuvastatin 10 MG tablet Commonly known as: Crestor Take 1 tablet (10 mg total) by mouth daily.   Evaristo Bury FlexTouch 200 UNIT/ML FlexTouch Pen Generic drug: insulin degludec Inject 26-40 Units into the skin at bedtime. Give 90-day supply with each refill What changed:  how much to take additional instructions Changed by: Elige Radon Ariadne Rissmiller         Objective:   BP (!) 136/96   Pulse 94   Ht 5\' 4"  (1.626 m)   Wt 259 lb (117.5 kg)   BMI 44.46 kg/m   Wt Readings from Last 3 Encounters:  02/11/24 259 lb (117.5 kg)  11/30/23 262 lb 12.8 oz (119.2 kg)  11/16/23 264 lb (119.7 kg)    Physical Exam Vitals and nursing note reviewed.  Constitutional:      General: She is not in acute distress.    Appearance: She is well-developed. She is not diaphoretic.  Eyes:     Conjunctiva/sclera: Conjunctivae normal.  Cardiovascular:     Rate and Rhythm: Normal rate and regular rhythm.     Heart sounds: Normal heart sounds. No murmur heard. Pulmonary:     Effort: Pulmonary effort is normal. No respiratory distress.     Breath sounds: Normal breath sounds. No wheezing.  Musculoskeletal:        General: No swelling.  Skin:    General: Skin is warm and dry.     Findings: No rash.  Neurological:      Mental Status: She is alert and oriented to person, place, and time.     Coordination: Coordination normal.  Psychiatric:        Behavior: Behavior normal.       Assessment & Plan:   Problem List Items Addressed This Visit       Cardiovascular and Mediastinum   Hypertension associated with type 2 diabetes mellitus (HCC) - Primary   Relevant Medications   TRESIBA FLEXTOUCH 200 UNIT/ML FlexTouch Pen   Other Relevant Orders   CBC with Differential/Platelet   CMP14+EGFR     Endocrine   Hypothyroidism (acquired)   Relevant Orders   TSH   Diabetes mellitus, type II, insulin dependent (HCC)   Relevant Medications   TRESIBA FLEXTOUCH 200 UNIT/ML FlexTouch Pen   Other Relevant Orders   CBC with  Differential/Platelet   CMP14+EGFR     Other   Depression, recurrent (HCC)   Relevant Medications   DULoxetine (CYMBALTA) 60 MG capsule   Other Relevant Orders   CBC with Differential/Platelet    Will check some blood work after the emergency department visit.  Seems like her blood sugars are running better at home.  She has not had anything over 200s and even then at the low 200s since leaving the hospital.  For the most part running between 150s and 180s.  Blood pressure is slightly elevated today, will have her continue to monitor but her home blood pressures look better.  She has been written down in a notebook Follow up plan: Return if symptoms worsen or fail to improve, for Diabetes recheck, she should be coming back in about a month or 2 for her regular visit..  Counseling provided for all of the vaccine components Orders Placed This Encounter  Procedures   CBC with Differential/Platelet   CMP14+EGFR   TSH    Arville Care, MD [facility] 02/11/2024, 2:08 PM

## 2024-02-12 LAB — CBC WITH DIFFERENTIAL/PLATELET
Basophils Absolute: 0.1 10*3/uL (ref 0.0–0.2)
Basos: 1 %
EOS (ABSOLUTE): 0 10*3/uL (ref 0.0–0.4)
Eos: 0 %
Hematocrit: 42.8 % (ref 34.0–46.6)
Hemoglobin: 14 g/dL (ref 11.1–15.9)
Immature Grans (Abs): 0 10*3/uL (ref 0.0–0.1)
Immature Granulocytes: 0 %
Lymphocytes Absolute: 3 10*3/uL (ref 0.7–3.1)
Lymphs: 37 %
MCH: 28.9 pg (ref 26.6–33.0)
MCHC: 32.7 g/dL (ref 31.5–35.7)
MCV: 88 fL (ref 79–97)
Monocytes Absolute: 0.6 10*3/uL (ref 0.1–0.9)
Monocytes: 8 %
Neutrophils Absolute: 4.4 10*3/uL (ref 1.4–7.0)
Neutrophils: 54 %
Platelets: 270 10*3/uL (ref 150–450)
RBC: 4.84 x10E6/uL (ref 3.77–5.28)
RDW: 12.8 % (ref 11.7–15.4)
WBC: 8.1 10*3/uL (ref 3.4–10.8)

## 2024-02-12 LAB — CMP14+EGFR
ALT: 41 IU/L — ABNORMAL HIGH (ref 0–32)
AST: 53 IU/L — ABNORMAL HIGH (ref 0–40)
Albumin: 4.5 g/dL (ref 3.8–4.9)
Alkaline Phosphatase: 107 IU/L (ref 44–121)
BUN/Creatinine Ratio: 16 (ref 9–23)
BUN: 11 mg/dL (ref 6–24)
Bilirubin Total: 1.2 mg/dL (ref 0.0–1.2)
CO2: 23 mmol/L (ref 20–29)
Calcium: 9.8 mg/dL (ref 8.7–10.2)
Chloride: 103 mmol/L (ref 96–106)
Creatinine, Ser: 0.68 mg/dL (ref 0.57–1.00)
Globulin, Total: 2.7 g/dL (ref 1.5–4.5)
Glucose: 100 mg/dL — ABNORMAL HIGH (ref 70–99)
Potassium: 4.1 mmol/L (ref 3.5–5.2)
Sodium: 141 mmol/L (ref 134–144)
Total Protein: 7.2 g/dL (ref 6.0–8.5)
eGFR: 102 mL/min/{1.73_m2} (ref >59)

## 2024-02-12 LAB — TSH: TSH: 3.11 u[IU]/mL (ref 0.450–4.500)

## 2024-02-15 ENCOUNTER — Other Ambulatory Visit: Payer: Self-pay | Admitting: Family Medicine

## 2024-02-15 DIAGNOSIS — E119 Type 2 diabetes mellitus without complications: Secondary | ICD-10-CM

## 2024-02-15 NOTE — Telephone Encounter (Signed)
 Copied from CRM [id number]. Topic: Clinical - Medication Refill >> Feb 15, 2024  6:01 PM Eunice Blase wrote: Most Recent Primary Care Visit:   Medication: TRESIBA FLEXTOUCH 200 UNIT/ML FlexTouch Pen  Has the patient contacted their pharmacy? Yes (Agent: If no, request that the patient contact the pharmacy for the refill. If patient does not wish to contact the pharmacy document the reason why and proceed with request.) (Agent: If yes, when and what did the pharmacy advise?)Pharmacy need PCP approval  Is this the correct pharmacy for this prescription? Yes If no, delete pharmacy and type the correct one.  This is the patient's preferred pharmacy:  [facility] [address], [place] - [address] [place] [address] Phone: [phone] Fax: [phone]   Has the prescription been filled recently? Yes  Is the patient out of the medication? Yes  Has the patient been seen for an appointment in the last year OR does the patient have an upcoming appointment? Yes  Can we respond through MyChart? Yes  Agent: Please be advised that Rx refills may take up to 3 business days. We ask that you follow-up with your pharmacy.

## 2024-02-18 ENCOUNTER — Telehealth: Payer: Self-pay

## 2024-02-18 ENCOUNTER — Telehealth: Payer: Self-pay | Admitting: Family Medicine

## 2024-02-18 ENCOUNTER — Other Ambulatory Visit (HOSPITAL_COMMUNITY): Payer: Self-pay

## 2024-02-18 NOTE — Telephone Encounter (Signed)
 Left voicemail to return call. Checking to see if she received Evaristo Bury Flextouch Pen sent on 02/11/24

## 2024-02-18 NOTE — Telephone Encounter (Unsigned)
 Copied from CRM [id number]. Topic: Clinical - Prescription Issue >> Feb 15, 2024  6:04 PM Eunice Blase wrote: Reason for CRM: Pt need prior authorization for TRESIBA FLEXTOUCH 200 UNIT/ML FlexTouch Pen. Please call pt [phone] when acquired.

## 2024-02-18 NOTE — Telephone Encounter (Signed)
 Pharmacy Patient Advocate Encounter   Received notification from Pt Calls Messages that prior authorization for Tresiba Flextouch 200u/ml is required/requested.   Insurance verification completed.   The patient is insured through [employer] [place] .   Per test claim: Refill too soon, Last filled 02/18/24 next fill 03/27/24. Prior authorization not required.

## 2024-02-21 ENCOUNTER — Encounter: Payer: Self-pay | Admitting: Family Medicine

## 2024-04-10 ENCOUNTER — Other Ambulatory Visit: Payer: Self-pay | Admitting: Family Medicine

## 2024-04-10 ENCOUNTER — Other Ambulatory Visit: Payer: Self-pay | Admitting: Internal Medicine

## 2024-04-14 ENCOUNTER — Encounter: Payer: Self-pay | Admitting: Family Medicine

## 2024-04-14 ENCOUNTER — Ambulatory Visit (INDEPENDENT_AMBULATORY_CARE_PROVIDER_SITE_OTHER): Admitting: Family Medicine

## 2024-04-14 VITALS — BP 151/105 | HR 88 | Temp 97.4°F | Ht 64.0 in | Wt 253.8 lb

## 2024-04-14 DIAGNOSIS — E039 Hypothyroidism, unspecified: Secondary | ICD-10-CM

## 2024-04-14 DIAGNOSIS — E1159 Type 2 diabetes mellitus with other circulatory complications: Secondary | ICD-10-CM | POA: Diagnosis not present

## 2024-04-14 DIAGNOSIS — Z794 Long term (current) use of insulin: Secondary | ICD-10-CM | POA: Diagnosis not present

## 2024-04-14 DIAGNOSIS — I152 Hypertension secondary to endocrine disorders: Secondary | ICD-10-CM

## 2024-04-14 DIAGNOSIS — E119 Type 2 diabetes mellitus without complications: Secondary | ICD-10-CM | POA: Diagnosis not present

## 2024-04-14 DIAGNOSIS — K760 Fatty (change of) liver, not elsewhere classified: Secondary | ICD-10-CM

## 2024-04-14 DIAGNOSIS — R946 Abnormal results of thyroid function studies: Secondary | ICD-10-CM | POA: Diagnosis not present

## 2024-04-14 LAB — BAYER DCA HB A1C WAIVED: HB A1C (BAYER DCA - WAIVED): 7.3 % — ABNORMAL HIGH (ref 4.8–5.6)

## 2024-04-14 NOTE — Progress Notes (Addendum)
 BP (!) 151/105   Pulse 88   Temp (!) 97.4 F (36.3 C)   Ht 5\' 4"  (1.626 m)   Wt 253 lb 12.8 oz (115.1 kg)   SpO2 91%   BMI 43.56 kg/m    Subjective:   Patient ID: Tina Chapman, female    DOB: [birth year], 58 y.o.   MRN: [id number]  HPI: Tina Chapman is a 58 y.o. female presenting on 04/14/2024 for Medical Management of Chronic Issues   HPI Type 2 diabetes mellitus Patient comes in today for recheck of his diabetes. Patient has been currently taking metformin  and Tresiba  13 units daily and Novolin R 8 units 3 times daily with meals. Patient is currently on an ACE inhibitor/ARB. Patient has not seen an ophthalmologist this year. Patient denies any new issues with their feet. The symptom started onset as an adult hypertension and hypothyroidism ARE RELATED TO DM   Hypertension Patient is currently on lisinopril , and their blood pressure today is 151/105. Patient denies any lightheadedness or dizziness. Patient denies headaches, blurred vision, chest pains, shortness of breath, or weakness. Denies any side effects from medication and is content with current medication.   Hypothyroidism recheck Patient is coming in for thyroid  recheck today as well. They deny any issues with hair changes or heat or cold problems or diarrhea or constipation. They deny any chest pain or palpitations. They are currently on levothyroxine  137 micrograms   Relevant past medical, surgical, family and social history reviewed and updated as indicated. Interim medical history since our last visit reviewed. Allergies and medications reviewed and updated.  Review of Systems  Constitutional:  Negative for chills and fever.  Eyes:  Negative for redness and visual disturbance.  Respiratory:  Negative for chest tightness and shortness of breath.   Cardiovascular:  Negative for chest pain and leg swelling.  Genitourinary:  Negative for difficulty urinating and dysuria.  Musculoskeletal:  Negative for back  pain and gait problem.  Skin:  Negative for rash.  Neurological:  Negative for dizziness, light-headedness and headaches.  Psychiatric/Behavioral:  Negative for agitation, behavioral problems and dysphoric mood.   All other systems reviewed and are negative.   Per HPI unless specifically indicated above   Allergies as of 04/14/2024   No Known Allergies      Medication List        Accurate as of April 14, 2024 11:41 AM. If you have any questions, ask your nurse or doctor.          Accu-Chek Guide Test test strip Generic drug: glucose blood Check BS QID Dx E11.9   Accu-Chek Guide w/Device Kit 1 each by Does not apply route 2 (two) times daily.   Accu-Chek Softclix Lancets lancets Check BS QID Dx E11.9   DULoxetine  60 MG capsule Commonly known as: CYMBALTA  Take 2 capsules (120 mg total) by mouth daily.   esomeprazole  40 MG capsule Commonly known as: NEXIUM  Take 1 capsule by mouth once to twice daily before a meal.   fluticasone  50 MCG/ACT nasal spray Commonly known as: FLONASE  Place 1 spray into both nostrils 2 (two) times daily as needed for allergies or rhinitis.   gabapentin  600 MG tablet Commonly known as: NEURONTIN  Take 1 tablet (600 mg total) by mouth 3 (three) times daily.   insulin  regular 100 units/mL injection Commonly known as: HumuLIN R  Inject 0.08-0.12 mLs (8-12 Units total) into the skin 3 (three) times daily before meals.   Insulin  Syringe-Needle U-100 31G X 5/16" 0.3 ML  Misc Commonly known as: BD Insulin  Syringe U/F USE FOUR TIMES DAILY w/ insulin  Dx E11.9   levothyroxine  137 MCG tablet Commonly known as: SYNTHROID  TAKE 1 TABLET BY MOUTH ONCE DAILY BEFORE BREAKFAST   lisinopril  40 MG tablet Commonly known as: ZESTRIL  Take 1 tablet (40 mg total) by mouth daily.   metFORMIN  500 MG tablet Commonly known as: GLUCOPHAGE  Take 1 tablet (500 mg total) by mouth 2 (two) times daily with a meal. (Needs to be seen before next refill)   methocarbamol   500 MG tablet Commonly known as: ROBAXIN  Take 1 tablet (500 mg total) by mouth 3 (three) times daily.   OLANZapine  15 MG tablet Commonly known as: ZYPREXA  Take 1 tablet (15 mg total) by mouth at bedtime.   rosuvastatin  10 MG tablet Commonly known as: Crestor  Take 1 tablet (10 mg total) by mouth daily.   Tresiba  FlexTouch 200 UNIT/ML FlexTouch Pen Generic drug: insulin  degludec Inject 26-40 Units into the skin at bedtime. Give 90-day supply with each refill         Objective:   BP (!) 151/105   Pulse 88   Temp (!) 97.4 F (36.3 C)   Ht 5\' 4"  (1.626 m)   Wt 253 lb 12.8 oz (115.1 kg)   SpO2 91%   BMI 43.56 kg/m   Wt Readings from Last 3 Encounters:  04/14/24 253 lb 12.8 oz (115.1 kg)  02/11/24 259 lb (117.5 kg)  11/30/23 262 lb 12.8 oz (119.2 kg)    Physical Exam Vitals and nursing note reviewed.  Constitutional:      General: She is not in acute distress.    Appearance: She is well-developed. She is not diaphoretic.  Eyes:     Conjunctiva/sclera: Conjunctivae normal.  Cardiovascular:     Rate and Rhythm: Normal rate and regular rhythm.     Heart sounds: Normal heart sounds. No murmur heard. Pulmonary:     Effort: Pulmonary effort is normal. No respiratory distress.     Breath sounds: Normal breath sounds. No wheezing.  Musculoskeletal:        General: No swelling.  Skin:    General: Skin is warm and dry.     Findings: No rash.  Neurological:     Mental Status: She is alert and oriented to person, place, and time.     Coordination: Coordination normal.  Psychiatric:        Behavior: Behavior normal.       Assessment & Plan:   Problem List Items Addressed This Visit       Cardiovascular and Mediastinum   Hypertension associated with type 2 diabetes mellitus (HCC) - Primary     Digestive   Fatty liver   Relevant Orders   Lipid panel     Endocrine   Hypothyroidism (acquired)   Relevant Orders   TSH   Diabetes mellitus, type II, insulin   dependent (HCC)   Relevant Orders   Vitamin B12   Microalbumin / creatinine urine ratio   Bayer DCA Hb A1c Waived   CBC with Differential/Platelet   CMP14+EGFR    A1c better than last time at 7.3.  Continue current medicine, and continue to focus on diet.  Blood pressure recheck was 145/87 which is tolerable for her, would like it to be better but will have a recheck at home and monitor and may need to increase medicines in the future if stays elevated. Follow up plan: Return in about 3 months (around 07/15/2024), or if symptoms worsen or  fail to improve, for Diabetes recheck.  Counseling provided for all of the vaccine components Orders Placed This Encounter  Procedures   Vitamin B12   Microalbumin / creatinine urine ratio   Lipid panel   Bayer DCA Hb A1c Waived   CBC with Differential/Platelet   CMP14+EGFR   TSH    Jolyne Needs, MD [facility] 04/14/2024, 11:41 AM

## 2024-04-15 LAB — CBC WITH DIFFERENTIAL/PLATELET
Basophils Absolute: 0 10*3/uL (ref 0.0–0.2)
Basos: 1 %
EOS (ABSOLUTE): 0 10*3/uL (ref 0.0–0.4)
Eos: 0 %
Hematocrit: 45.1 % (ref 34.0–46.6)
Hemoglobin: 15.1 g/dL (ref 11.1–15.9)
Immature Grans (Abs): 0 10*3/uL (ref 0.0–0.1)
Immature Granulocytes: 0 %
Lymphocytes Absolute: 2.3 10*3/uL (ref 0.7–3.1)
Lymphs: 35 %
MCH: 29.9 pg (ref 26.6–33.0)
MCHC: 33.5 g/dL (ref 31.5–35.7)
MCV: 89 fL (ref 79–97)
Monocytes Absolute: 0.5 10*3/uL (ref 0.1–0.9)
Monocytes: 7 %
Neutrophils Absolute: 3.7 10*3/uL (ref 1.4–7.0)
Neutrophils: 57 %
Platelets: 271 10*3/uL (ref 150–450)
RBC: 5.05 x10E6/uL (ref 3.77–5.28)
RDW: 12.7 % (ref 11.7–15.4)
WBC: 6.6 10*3/uL (ref 3.4–10.8)

## 2024-04-15 LAB — CMP14+EGFR
ALT: 36 IU/L — ABNORMAL HIGH (ref 0–32)
AST: 62 IU/L — ABNORMAL HIGH (ref 0–40)
Albumin: 4.4 g/dL (ref 3.8–4.9)
Alkaline Phosphatase: 116 IU/L (ref 44–121)
BUN/Creatinine Ratio: 15 (ref 9–23)
BUN: 9 mg/dL (ref 6–24)
Bilirubin Total: 1.3 mg/dL — ABNORMAL HIGH (ref 0.0–1.2)
CO2: 22 mmol/L (ref 20–29)
Calcium: 9.7 mg/dL (ref 8.7–10.2)
Chloride: 102 mmol/L (ref 96–106)
Creatinine, Ser: 0.62 mg/dL (ref 0.57–1.00)
Globulin, Total: 2.7 g/dL (ref 1.5–4.5)
Glucose: 166 mg/dL — ABNORMAL HIGH (ref 70–99)
Potassium: 4.2 mmol/L (ref 3.5–5.2)
Sodium: 142 mmol/L (ref 134–144)
Total Protein: 7.1 g/dL (ref 6.0–8.5)
eGFR: 104 mL/min/{1.73_m2} (ref >59)

## 2024-04-15 LAB — LIPID PANEL
Chol/HDL Ratio: 3.2 ratio (ref 0.0–4.4)
Cholesterol, Total: 114 mg/dL (ref 100–199)
HDL: 36 mg/dL — ABNORMAL LOW (ref >39)
LDL Chol Calc (NIH): 49 mg/dL (ref 0–99)
Triglycerides: 174 mg/dL — ABNORMAL HIGH (ref 0–149)
VLDL Cholesterol Cal: 29 mg/dL (ref 5–40)

## 2024-04-15 LAB — MICROALBUMIN / CREATININE URINE RATIO
Creatinine, Urine: 95.6 mg/dL
Microalb/Creat Ratio: 40 mg/g{creat} — ABNORMAL HIGH (ref 0–29)
Microalbumin, Urine: 38.4 ug/mL

## 2024-04-15 LAB — VITAMIN B12: Vitamin B-12: 567 pg/mL (ref 232–1245)

## 2024-04-17 ENCOUNTER — Other Ambulatory Visit: Payer: Self-pay | Admitting: Neurosurgery

## 2024-04-17 DIAGNOSIS — M542 Cervicalgia: Secondary | ICD-10-CM | POA: Diagnosis not present

## 2024-04-17 DIAGNOSIS — M47816 Spondylosis without myelopathy or radiculopathy, lumbar region: Secondary | ICD-10-CM | POA: Diagnosis not present

## 2024-04-17 DIAGNOSIS — M5412 Radiculopathy, cervical region: Secondary | ICD-10-CM | POA: Diagnosis not present

## 2024-04-17 DIAGNOSIS — M5416 Radiculopathy, lumbar region: Secondary | ICD-10-CM

## 2024-04-17 LAB — TSH: TSH: 3.33 u[IU]/mL (ref 0.450–4.500)

## 2024-04-17 LAB — SPECIMEN STATUS REPORT

## 2024-04-23 ENCOUNTER — Ambulatory Visit: Payer: Self-pay | Admitting: Family Medicine

## 2024-04-23 DIAGNOSIS — R7989 Other specified abnormal findings of blood chemistry: Secondary | ICD-10-CM

## 2024-04-24 ENCOUNTER — Ambulatory Visit
Admission: RE | Admit: 2024-04-24 | Discharge: 2024-04-24 | Disposition: A | Discharge status: Home / Self Care | Source: Ambulatory Visit | Attending: Neurosurgery | Admitting: Neurosurgery

## 2024-04-24 DIAGNOSIS — M5416 Radiculopathy, lumbar region: Secondary | ICD-10-CM

## 2024-04-24 DIAGNOSIS — M4316 Spondylolisthesis, lumbar region: Secondary | ICD-10-CM | POA: Diagnosis not present

## 2024-04-24 DIAGNOSIS — M47816 Spondylosis without myelopathy or radiculopathy, lumbar region: Secondary | ICD-10-CM | POA: Diagnosis not present

## 2024-04-24 DIAGNOSIS — M5126 Other intervertebral disc displacement, lumbar region: Secondary | ICD-10-CM | POA: Diagnosis not present

## 2024-05-07 DIAGNOSIS — M5412 Radiculopathy, cervical region: Secondary | ICD-10-CM | POA: Diagnosis not present

## 2024-05-07 DIAGNOSIS — G5703 Lesion of sciatic nerve, bilateral lower limbs: Secondary | ICD-10-CM | POA: Diagnosis not present

## 2024-05-07 DIAGNOSIS — M47816 Spondylosis without myelopathy or radiculopathy, lumbar region: Secondary | ICD-10-CM | POA: Diagnosis not present

## 2024-05-07 DIAGNOSIS — M542 Cervicalgia: Secondary | ICD-10-CM | POA: Diagnosis not present

## 2024-05-07 DIAGNOSIS — M5416 Radiculopathy, lumbar region: Secondary | ICD-10-CM | POA: Diagnosis not present

## 2024-05-22 DIAGNOSIS — M7918 Myalgia, other site: Secondary | ICD-10-CM | POA: Diagnosis not present

## 2024-05-22 DIAGNOSIS — G5703 Lesion of sciatic nerve, bilateral lower limbs: Secondary | ICD-10-CM | POA: Diagnosis not present

## 2024-05-23 ENCOUNTER — Encounter: Payer: Self-pay | Admitting: Gastroenterology

## 2024-06-04 ENCOUNTER — Ambulatory Visit: Admitting: Gastroenterology

## 2024-06-20 ENCOUNTER — Other Ambulatory Visit: Payer: Self-pay | Admitting: Family Medicine

## 2024-06-20 DIAGNOSIS — E119 Type 2 diabetes mellitus without complications: Secondary | ICD-10-CM

## 2024-06-23 ENCOUNTER — Other Ambulatory Visit: Payer: Self-pay | Admitting: Family Medicine

## 2024-06-23 DIAGNOSIS — E119 Type 2 diabetes mellitus without complications: Secondary | ICD-10-CM

## 2024-06-23 NOTE — Telephone Encounter (Signed)
 Copied from CRM #[id number]. Topic: Clinical - Medication Refill >> Jun 23, 2024  8:47 AM Emylou G wrote: Medication: HUMULIN R  100 UNIT/ML injection  Has the patient contacted their pharmacy? Yes (Agent: If no, request that the patient contact the pharmacy for the refill. If patient does not wish to contact the pharmacy document the reason why and proceed with request.) (Agent: If yes, when and what did the pharmacy advise?) said to call us   This is the patient's preferred pharmacy:  [facility] [address], [place] - [address] [place] [address] Phone: [phone] Fax: [phone]  Is this the correct pharmacy for this prescription? Yes If no, delete pharmacy and type the correct one.   Has the prescription been filled recently? No  Is the patient out of the medication? Yes  Has the patient been seen for an appointment in the last year OR does the patient have an upcoming appointment? Yes  Can we respond through MyChart? Yes  Agent: Please be advised that Rx refills may take up to 3 business days. We ask that you follow-up with your pharmacy.

## 2024-06-24 ENCOUNTER — Ambulatory Visit: Admitting: Gastroenterology

## 2024-06-24 NOTE — Telephone Encounter (Signed)
 LMOVM refill for Humulin was sent to pharmacy yesterday, please check with them

## 2024-07-16 ENCOUNTER — Telehealth: Payer: Self-pay | Admitting: Family Medicine

## 2024-07-16 ENCOUNTER — Encounter: Payer: Self-pay | Admitting: Family Medicine

## 2024-07-16 ENCOUNTER — Ambulatory Visit: Admitting: Family Medicine

## 2024-07-16 VITALS — BP 134/85 | HR 106 | Ht 64.0 in | Wt 254.0 lb

## 2024-07-16 DIAGNOSIS — F339 Major depressive disorder, recurrent, unspecified: Secondary | ICD-10-CM

## 2024-07-16 DIAGNOSIS — I10 Essential (primary) hypertension: Secondary | ICD-10-CM

## 2024-07-16 DIAGNOSIS — E119 Type 2 diabetes mellitus without complications: Secondary | ICD-10-CM | POA: Diagnosis not present

## 2024-07-16 DIAGNOSIS — Z794 Long term (current) use of insulin: Secondary | ICD-10-CM

## 2024-07-16 DIAGNOSIS — Z0279 Encounter for issue of other medical certificate: Secondary | ICD-10-CM

## 2024-07-16 DIAGNOSIS — I152 Hypertension secondary to endocrine disorders: Secondary | ICD-10-CM

## 2024-07-16 DIAGNOSIS — E039 Hypothyroidism, unspecified: Secondary | ICD-10-CM | POA: Diagnosis not present

## 2024-07-16 DIAGNOSIS — Z23 Encounter for immunization: Secondary | ICD-10-CM

## 2024-07-16 DIAGNOSIS — E1159 Type 2 diabetes mellitus with other circulatory complications: Secondary | ICD-10-CM | POA: Diagnosis not present

## 2024-07-16 LAB — BAYER DCA HB A1C WAIVED: HB A1C (BAYER DCA - WAIVED): 7.3 % — ABNORMAL HIGH (ref 4.8–5.6)

## 2024-07-16 MED ORDER — METHOCARBAMOL 500 MG PO TABS: 500.0000 mg | ORAL_TABLET | Freq: Three times a day (TID) | ORAL | 2 refills | Status: AC

## 2024-07-16 MED ORDER — TRESIBA FLEXTOUCH 200 UNIT/ML ~~LOC~~ SOPN: 26.0000 [IU] | PEN_INJECTOR | Freq: Every day | SUBCUTANEOUS | 3 refills | Status: AC

## 2024-07-16 MED ORDER — LISINOPRIL 40 MG PO TABS: 40.0000 mg | ORAL_TABLET | Freq: Every day | ORAL | 3 refills | Status: AC

## 2024-07-16 MED ORDER — OLANZAPINE 20 MG PO TABS: 20.0000 mg | ORAL_TABLET | Freq: Every day | ORAL | 3 refills | Status: AC

## 2024-07-16 MED ORDER — OLANZAPINE 15 MG PO TABS: 15.0000 mg | ORAL_TABLET | Freq: Every day | ORAL | 3 refills | Status: DC

## 2024-07-16 MED ORDER — GABAPENTIN 600 MG PO TABS: 600.0000 mg | ORAL_TABLET | Freq: Three times a day (TID) | ORAL | 3 refills | Status: AC

## 2024-07-16 MED ORDER — METFORMIN HCL 500 MG PO TABS: 500.0000 mg | ORAL_TABLET | Freq: Two times a day (BID) | ORAL | 3 refills | Status: AC

## 2024-07-16 MED ORDER — ROSUVASTATIN CALCIUM 10 MG PO TABS: 10.0000 mg | ORAL_TABLET | Freq: Every day | ORAL | 3 refills | Status: AC

## 2024-07-16 NOTE — Progress Notes (Signed)
 BP 134/85   Pulse (!) 106   Ht 5' 4 (1.626 m)   Wt 254 lb (115.2 kg)   SpO2 96%   BMI 43.60 kg/m    Subjective:   Patient ID: Tina Chapman, female    DOB: 06-Mar-1966, 58 y.o.   MRN: [id number]  HPI: Tina Chapman is a 58 y.o. female presenting on 07/16/2024 for Medical Management of Chronic Issues, Hypertension, Hyperlipidemia, and Hypothyroidism   Discussed the use of AI scribe software for clinical note transcription with the patient, who gave verbal consent to proceed.  History of Present Illness   Tina Chapman is a 58 year old female with diabetes and hypertension who presents for follow-up of her blood sugar and blood pressure management.  Her blood sugar levels have been variable over the past month, with readings ranging from 70 to 207 mg/dL. The highest reading of 207 mg/dL occurred early in August, but since then, her levels have been more stable, typically in the 90s to 130s. She attributes the improvement to dietary changes, specifically reducing her intake of rice and flour. She is currently taking 13 units of Tresiba  insulin  and metformin  twice daily.  Her blood pressure has been elevated recently, with readings of 145/155 mmHg in the office and around 140 mmHg at home. She is taking lisinopril  daily and most of her medications in the morning, with a few at night for back pain.  She experiences chronic back pain, currently managed with gabapentin  600 mg three times daily. Her previous back injection has worn off, and she plans to discuss further injections with a specialist in September. For her back pain and anxiety, she is taking olanzapine  15 mg and duloxetine , which she feels are helping. However, her anxiety and depression could be better managed, and she is considering an adjustment in her medication.  Her liver numbers have been fluctuating but stable. She has been taking a liver supplement to support her liver health.  No issues with her  feet, such as sores or infections, and she attributes any leg discomfort to her back issues. She is up to date with her pneumonia and flu vaccinations. She reports no side effects from her current medications.           07/16/2024   11:13 AM 04/14/2024   11:29 AM 02/11/2024    1:58 PM 11/16/2023    4:02 PM 10/03/2023    9:56 AM  Depression screen PHQ 2/9  Decreased Interest 2 3 2 3    Down, Depressed, Hopeless 3 3 3 3 3   PHQ - 2 Score 5 6 5 6 3   Altered sleeping 3 2 3 3 3   Tired, decreased energy 3 2 3 2 3   Change in appetite 2 2 2 2 3   Feeling bad or failure about yourself  3 3 3 3 3   Trouble concentrating 3 2 3 3 2   Moving slowly or fidgety/restless 2 2 2 2  0  Suicidal thoughts 3 3 3 2    PHQ-9 Score 24 22 24 23 17   Difficult doing work/chores Very difficult Somewhat difficult Very difficult Extremely dIfficult Extremely dIfficult      Relevant past medical, surgical, family and social history reviewed and updated as indicated. Interim medical history since our last visit reviewed. Allergies and medications reviewed and updated.  Review of Systems  Constitutional:  Negative for chills and fever.  Eyes:  Negative for redness and visual disturbance.  Respiratory:  Negative for chest tightness and shortness of breath.  Cardiovascular:  Negative for chest pain and leg swelling.  Genitourinary:  Negative for difficulty urinating and dysuria.  Skin:  Negative for rash.  Neurological:  Negative for light-headedness and headaches.  Psychiatric/Behavioral:  Positive for dysphoric mood. Negative for agitation, behavioral problems, self-injury, sleep disturbance and suicidal ideas. The patient is nervous/anxious.   All other systems reviewed and are negative.   Per HPI unless specifically indicated above   Allergies as of 07/16/2024   No Known Allergies      Medication List        Accurate as of July 16, 2024 11:51 AM. If you have any questions, ask your nurse or doctor.           Accu-Chek Guide Test test strip Generic drug: glucose blood Check BS QID Dx E11.9   Accu-Chek Guide w/Device Kit 1 each by Does not apply route 2 (two) times daily.   Accu-Chek Softclix Lancets lancets Check BS QID Dx E11.9   DULoxetine  60 MG capsule Commonly known as: CYMBALTA  Take 2 capsules (120 mg total) by mouth daily.   esomeprazole  40 MG capsule Commonly known as: NEXIUM  Take 1 capsule by mouth once to twice daily before a meal.   fluticasone  50 MCG/ACT nasal spray Commonly known as: FLONASE  Place 1 spray into both nostrils 2 (two) times daily as needed for allergies or rhinitis.   gabapentin  600 MG tablet Commonly known as: NEURONTIN  Take 1 tablet (600 mg total) by mouth 3 (three) times daily.   HumuLIN R  100 UNIT/ML injection Generic drug: insulin  regular INJECT 8 UNITS SUBCUTANEOUSLY THREE TIMES DAILY BEFORE MEALS   Insulin  Syringe-Needle U-100 31G X 5/16 0.3 ML Misc Commonly known as: BD Insulin  Syringe U/F USE FOUR TIMES DAILY w/ insulin  Dx E11.9   levothyroxine  137 MCG tablet Commonly known as: SYNTHROID  TAKE 1 TABLET BY MOUTH ONCE DAILY BEFORE BREAKFAST   lisinopril  40 MG tablet Commonly known as: ZESTRIL  Take 1 tablet (40 mg total) by mouth daily.   metFORMIN  500 MG tablet Commonly known as: GLUCOPHAGE  Take 1 tablet (500 mg total) by mouth 2 (two) times daily with a meal. What changed: additional instructions Changed by: Fonda LABOR Quaneisha Hanisch   methocarbamol  500 MG tablet Commonly known as: ROBAXIN  Take 1 tablet (500 mg total) by mouth 3 (three) times daily.   NON FORMULARY Take 1 Dose by mouth daily. Liver supplement   OLANZapine  20 MG tablet Commonly known as: ZYPREXA  Take 1 tablet (20 mg total) by mouth at bedtime. What changed:  medication strength how much to take Changed by: Fonda LABOR Nyeli Holtmeyer   rosuvastatin  10 MG tablet Commonly known as: Crestor  Take 1 tablet (10 mg total) by mouth daily.   Tresiba  FlexTouch 200 UNIT/ML  FlexTouch Pen Generic drug: insulin  degludec Inject 26-40 Units into the skin at bedtime. Give 90-day supply with each refill         Objective:   BP 134/85   Pulse (!) 106   Ht 5' 4 (1.626 m)   Wt 254 lb (115.2 kg)   SpO2 96%   BMI 43.60 kg/m   Wt Readings from Last 3 Encounters:  07/16/24 254 lb (115.2 kg)  04/14/24 253 lb 12.8 oz (115.1 kg)  02/11/24 259 lb (117.5 kg)    Physical Exam Physical Exam   VITALS: BP- 136/84 NECK: Thyroid  without masses. CHEST: Lungs clear to auscultation bilaterally. CARDIOVASCULAR: Regular rate and rhythm, no murmurs. EXTREMITIES: Feet with intact sensation, no lesions, good pulses.  Assessment & Plan:   Problem List Items Addressed This Visit       Cardiovascular and Mediastinum   Hypertension associated with type 2 diabetes mellitus (HCC)   Relevant Medications   lisinopril  (ZESTRIL ) 40 MG tablet   metFORMIN  (GLUCOPHAGE ) 500 MG tablet   rosuvastatin  (CRESTOR ) 10 MG tablet   TRESIBA  FLEXTOUCH 200 UNIT/ML FlexTouch Pen     Endocrine   Hypothyroidism (acquired)   Diabetes mellitus, type II, insulin  dependent (HCC) - Primary   Relevant Medications   lisinopril  (ZESTRIL ) 40 MG tablet   metFORMIN  (GLUCOPHAGE ) 500 MG tablet   rosuvastatin  (CRESTOR ) 10 MG tablet   TRESIBA  FLEXTOUCH 200 UNIT/ML FlexTouch Pen   Other Relevant Orders   Bayer DCA Hb A1c Waived     Other   Depression, recurrent (HCC)   Relevant Medications   OLANZapine  (ZYPREXA ) 20 MG tablet   Other Visit Diagnoses       Primary hypertension       Relevant Medications   lisinopril  (ZESTRIL ) 40 MG tablet   rosuvastatin  (CRESTOR ) 10 MG tablet          Type 2 diabetes mellitus Blood glucose improved with dietary changes; A1c stable at 7.3%. - Continue Tresiba  13 units daily. - Continue metformin  twice daily. - Encourage dietary modifications.  Essential hypertension Blood pressure elevated in clinic and at home despite lisinopril . - Continue  lisinopril . - Monitor blood pressure at home.  Depression and anxiety Current treatment with olanzapine  and duloxetine  provides some benefit; dose adjustment needed. - Increase olanzapine  to 20 mg. - Continue duloxetine .  Chronic back pain Managed with gabapentin ; last injection effect has worn off. - Continue gabapentin  600 mg three times daily. - Discuss next back injection timing with specialist in September.  General Health Maintenance Received pneumonia and flu vaccines; Pap smear due soon. - Schedule Pap smear and physical examination.          Follow up plan: Return in about 3 months (around 10/15/2024), or if symptoms worsen or fail to improve, for Diabetes and thyroid .  Counseling provided for all of the vaccine components Orders Placed This Encounter  Procedures   Bayer DCA Hb A1c Waived    Fonda Levins, MD [facility] 07/16/2024, 11:51 AM

## 2024-07-16 NOTE — Telephone Encounter (Signed)
 Tina Chapman dropped off Handicap forms to be completed and signed.  Form Fee Paid? (Y/N)     y       If NO, form is placed on front office manager desk to hold until payment received. If YES, then form will be placed in the RX/HH Nurse Coordinators box for completion.  Form will not be processed until payment is received

## 2024-07-20 NOTE — Progress Notes (Deleted)
 GI Office Note    Referring Provider: Dettinger, Fonda LABOR, MD Primary Care Physician:  Dettinger, Fonda LABOR, MD  Primary Gastroenterologist: Carlin POUR. Cindie, DO   Chief Complaint   No chief complaint on file.   History of Present Illness   Tina Chapman is a 58 y.o. female presenting today for follow up. Last seen in 11/2023. H/o GERD with dysphagia, adenomatous colon polyps, elevated AP with negative AMA, normal GGT. U/S with fatty liver and splenomegaly 03/2023.      Prior Data    Abd u/s 03/2023: IMPRESSION: 1. The gallbladder is surgically absent. 2. Diffuse increased echogenicity throughout the liver is nonspecific and may be seen with hepatic steatosis or other underlying intrinsic liver disease. 3. Splenomegaly with a volume of 531 cc. 4. The proximal abdominal aorta and bifurcation were obscured by shadowing bowel gas.   Colonoscopy 02/2023:  -three 4-5 mm polyps in sigmoid colon and descending colon -moderate semi-liquid stool entire colon, lavage resulting in fair visualization, recommending colonoscopy in 5 years -tubular adenoma   EGD 02/2023: -z-line irregular, esophageal biopsies c/w reflux -mild chronic gastritis, negative for H.pylori       Medications   Current Outpatient Medications  Medication Sig Dispense Refill   Accu-Chek Softclix Lancets lancets Check BS QID Dx E11.9 400 each 3   Blood Glucose Monitoring Suppl (ACCU-CHEK GUIDE) w/Device KIT 1 each by Does not apply route 2 (two) times daily. 1 kit 0   DULoxetine  (CYMBALTA ) 60 MG capsule Take 2 capsules (120 mg total) by mouth daily. 180 capsule 3   esomeprazole  (NEXIUM ) 40 MG capsule Take 1 capsule by mouth once to twice daily before a meal. 60 capsule 5   fluticasone  (FLONASE ) 50 MCG/ACT nasal spray Place 1 spray into both nostrils 2 (two) times daily as needed for allergies or rhinitis. 16 g 6   gabapentin  (NEURONTIN ) 600 MG tablet Take 1 tablet (600 mg total) by mouth 3 (three)  times daily. 270 tablet 3   glucose blood (ACCU-CHEK GUIDE TEST) test strip Check BS QID Dx E11.9 400 each 3   HUMULIN R  100 UNIT/ML injection INJECT 8 UNITS SUBCUTANEOUSLY THREE TIMES DAILY BEFORE MEALS 10 mL 0   Insulin  Syringe-Needle U-100 (BD INSULIN  SYRINGE U/F) 31G X 5/16 0.3 ML MISC USE FOUR TIMES DAILY w/ insulin  Dx E11.9 400 each 3   levothyroxine  (SYNTHROID ) 137 MCG tablet TAKE 1 TABLET BY MOUTH ONCE DAILY BEFORE BREAKFAST 90 tablet 3   lisinopril  (ZESTRIL ) 40 MG tablet Take 1 tablet (40 mg total) by mouth daily. 90 tablet 3   metFORMIN  (GLUCOPHAGE ) 500 MG tablet Take 1 tablet (500 mg total) by mouth 2 (two) times daily with a meal. 180 tablet 3   methocarbamol  (ROBAXIN ) 500 MG tablet Take 1 tablet (500 mg total) by mouth 3 (three) times daily. 90 tablet 2   NON FORMULARY Take 1 Dose by mouth daily. Liver supplement     OLANZapine  (ZYPREXA ) 20 MG tablet Take 1 tablet (20 mg total) by mouth at bedtime. 90 tablet 3   rosuvastatin  (CRESTOR ) 10 MG tablet Take 1 tablet (10 mg total) by mouth daily. 90 tablet 3   TRESIBA  FLEXTOUCH 200 UNIT/ML FlexTouch Pen Inject 26-40 Units into the skin at bedtime. Give 90-day supply with each refill 20 mL 3   No current facility-administered medications for this visit.    Allergies   Allergies as of 07/21/2024   (No Known Allergies)     Past Medical History  Past Medical History:  Diagnosis Date   Anxiety    Back pain    Depression    Diabetes (HCC)    GERD (gastroesophageal reflux disease)    History of kidney stones    Hyperlipidemia    Hypertension    Hypothyroidism    Sciatic nerve pain    Sleep apnea     Past Surgical History   Past Surgical History:  Procedure Laterality Date   ABDOMINAL HYSTERECTOMY     APPENDECTOMY     BIOPSY  02/12/2023   Procedure: BIOPSY;  Surgeon: Cindie Carlin POUR, DO;  [facility];  Service: Endoscopy;;   CERVIX SURGERY     CHOLECYSTECTOMY     COLONOSCOPY WITH  ESOPHAGOGASTRODUODENOSCOPY (EGD)     in [place]    COLONOSCOPY WITH PROPOFOL  N/A 02/12/2023   Procedure: COLONOSCOPY WITH PROPOFOL ;  Surgeon: Cindie Carlin POUR, DO;  [facility]: [facility];  Service: Endoscopy;  Laterality: N/A;  12:45 pm   ESOPHAGOGASTRODUODENOSCOPY (EGD) WITH PROPOFOL  N/A 02/12/2023   Procedure: ESOPHAGOGASTRODUODENOSCOPY (EGD) WITH PROPOFOL ;  Surgeon: Cindie Carlin POUR, DO;  Location: [facility];  Service: Endoscopy;  Laterality: N/A;   fatty tumor removed     NECK SURGERY  2002   has metal plate    POLYPECTOMY  02/12/2023   Procedure: POLYPECTOMY;  Surgeon: Cindie Carlin POUR, DO;  Location: [facility];  Service: Endoscopy;;   TONSILLECTOMY      Past Family History   Family History  Problem Relation Age of Onset   Early death Mother        60   Arthritis Mother    Heart disease Mother    Arthritis Father    Diabetes Paternal Uncle    Cancer Maternal Grandmother        colon, age 54   Esophageal cancer Neg Hx    Stomach cancer Neg Hx    Breast cancer Neg Hx     Past Social History   Social History   Socioeconomic History   Marital status: Divorced    Spouse name: Not on file   Number of children: Not on file   Years of education: Not on file   Highest education level: Not on file  Occupational History   Not on file  Tobacco Use   Smoking status: Never   Smokeless tobacco: Never  Vaping Use   Vaping status: Never Used  Substance and Sexual Activity   Alcohol use: Never   Drug use: Never   Sexual activity: Not Currently  Other Topics Concern   Not on file  Social History Narrative   77 son 48 years old, here with her   Social Drivers of Health   Financial Resource Strain: Medium Risk (10/03/2023)   Overall Financial Resource Strain (CARDIA)    Difficulty of Paying Living Expenses: Somewhat hard  Food Insecurity: Food Insecurity Present (10/03/2023)   Hunger Vital Sign    Worried About Running Out of Food in the Last Year: Often true     Ran Out of Food in the Last Year: Sometimes true  Transportation Needs: No Transportation Needs (10/03/2023)   PRAPARE - Administrator, Civil Service (Medical): No    Lack of Transportation (Non-Medical): No  Physical Activity: Inactive (10/03/2023)   Exercise Vital Sign    Days of Exercise per Week: 0 days    Minutes of Exercise per Session: 0 min  Stress: Stress Concern Present (10/03/2023)   [employer] of Occupational  Health - Occupational Stress Questionnaire    Feeling of Stress : Very much  Social Connections: Socially Isolated (10/03/2023)   Social Connection and Isolation Panel    Frequency of Communication with Friends and Family: More than three times a week    Frequency of Social Gatherings with Friends and Family: More than three times a week    Attends Religious Services: Never    Database administrator or Organizations: No    Attends Banker Meetings: Never    Marital Status: Divorced  Catering manager Violence: Not At Risk (10/03/2023)   Humiliation, Afraid, Rape, and Kick questionnaire    Fear of Current or Ex-Partner: No    Emotionally Abused: No    Physically Abused: No    Sexually Abused: No    Review of Systems   General: Negative for anorexia, weight loss, fever, chills, fatigue, weakness. ENT: Negative for hoarseness, difficulty swallowing , nasal congestion. CV: Negative for chest pain, angina, palpitations, dyspnea on exertion, peripheral edema.  Respiratory: Negative for dyspnea at rest, dyspnea on exertion, cough, sputum, wheezing.  GI: See history of present illness. GU:  Negative for dysuria, hematuria, urinary incontinence, urinary frequency, nocturnal urination.  Endo: Negative for unusual weight change.     Physical Exam   There were no vitals taken for this visit.   General: Well-nourished, well-developed in no acute distress.  Eyes: No icterus. Mouth: Oropharyngeal mucosa moist and pink   Lungs: Clear to  auscultation bilaterally.  Heart: Regular rate and rhythm, no murmurs rubs or gallops.  Abdomen: Bowel sounds are normal, nontender, nondistended, no hepatosplenomegaly or masses,  no abdominal bruits or hernia , no rebound or guarding.  Rectal: not performed Extremities: No lower extremity edema. No clubbing or deformities. Neuro: Alert and oriented x 4   Skin: Warm and dry, no jaundice.   Psych: Alert and cooperative, normal mood and affect.  Labs   Lab Results  Component Value Date   HGBA1C 7.3 (H) 07/16/2024   Lab Results  Component Value Date   NA 142 04/14/2024   CL 102 04/14/2024   K 4.2 04/14/2024   CO2 22 04/14/2024   BUN 9 04/14/2024   CREATININE 0.62 04/14/2024   EGFR 104 04/14/2024   CALCIUM  9.7 04/14/2024   ALBUMIN 4.4 04/14/2024   GLUCOSE 166 (H) 04/14/2024   Lab Results  Component Value Date   ALT 36 (H) 04/14/2024   AST 62 (H) 04/14/2024   GGT 26 03/13/2023   ALKPHOS 116 04/14/2024   BILITOT 1.3 (H) 04/14/2024   Lab Results  Component Value Date   TSH 3.330 04/14/2024   Lab Results  Component Value Date   WBC 6.6 04/14/2024   HGB 15.1 04/14/2024   HCT 45.1 04/14/2024   MCV 89 04/14/2024   PLT 271 04/14/2024   Lab Results  Component Value Date   TSH 3.330 04/14/2024   Lab Results  Component Value Date   VITAMINB12 567 04/14/2024    Imaging Studies   No results found.  Assessment/Plan:           Tina Chapman, MHS, PA-C [facility]

## 2024-07-21 ENCOUNTER — Ambulatory Visit: Admitting: Gastroenterology

## 2024-08-27 DIAGNOSIS — G5703 Lesion of sciatic nerve, bilateral lower limbs: Secondary | ICD-10-CM | POA: Diagnosis not present

## 2024-09-01 ENCOUNTER — Encounter: Payer: Self-pay | Admitting: Gastroenterology

## 2024-09-01 ENCOUNTER — Ambulatory Visit: Admitting: Gastroenterology

## 2024-09-01 ENCOUNTER — Telehealth: Payer: Self-pay | Admitting: *Deleted

## 2024-09-01 VITALS — BP 154/99 | HR 101 | Temp 98.1°F | Ht 64.0 in | Wt 252.6 lb

## 2024-09-01 DIAGNOSIS — K219 Gastro-esophageal reflux disease without esophagitis: Secondary | ICD-10-CM

## 2024-09-01 DIAGNOSIS — R1011 Right upper quadrant pain: Secondary | ICD-10-CM

## 2024-09-01 DIAGNOSIS — K760 Fatty (change of) liver, not elsewhere classified: Secondary | ICD-10-CM | POA: Diagnosis not present

## 2024-09-01 DIAGNOSIS — R7989 Other specified abnormal findings of blood chemistry: Secondary | ICD-10-CM | POA: Diagnosis not present

## 2024-09-01 DIAGNOSIS — Z860101 Personal history of adenomatous and serrated colon polyps: Secondary | ICD-10-CM

## 2024-09-01 NOTE — Progress Notes (Signed)
 GI Office Note    Referring Provider: Dettinger, Fonda LABOR, MD Primary Care Physician:  Dettinger, Fonda LABOR, MD  Primary Gastroenterologist: Carlin POUR. Cindie, DO   Chief Complaint   Chief Complaint  Patient presents with   Follow-up    History of Present Illness   Tina Chapman is a 58 y.o. female presenting today for follow up elevated LFTs, chronic GERD, h/o colon polyps. Last seen 11/2023. She presents back today at request of Dr. Maryanne for follow up of abnormal LFTs.  Discussed the use of AI scribe software for clinical note transcription with the patient, who gave verbal consent to proceed.   She has been taking a supplement called 'Doctor's Preferred Liver Defense'  for her liver condition. Imaging has shown a fatty liver. History of intermittent RUQ pain. Currently no abdominal pain. No nausea, vomiting, or changes in medication since her last visit. Occasional diarrhea, attributed to medications including metformin , with bowel movements varying between [place] 4 and 6, occurring about three times a day. No melena, brbpr.   Her blood sugar levels fluctuate depending on her diet, with recent readings ranging from 90 to 180 mg/dL. Her last A1c in September was 7.3%, improved from 8% in January. She is on medication for cholesterol, which is well-controlled with a recent LDL of 49 mg/dL. She also takes medication for high blood pressure but BP little high today.  She does not consume alcohol and occasionally drinks chamomile and [place] flower tea. No heartburn or indigestion on esomeprazole . No dysphagia. No vomiting.   She has a history of sleep apnea but does not use a CPAP machine. She engages in physical activity by walking 15-20 minutes daily, both indoors and outdoors.      Wt Readings from Last 10 Encounters:  09/01/24 252 lb 9.6 oz (114.6 kg)  07/16/24 254 lb (115.2 kg)  04/14/24 253 lb 12.8 oz (115.1 kg)  02/11/24 259 lb (117.5 kg)  11/30/23 262 lb 12.8  oz (119.2 kg)  11/16/23 264 lb (119.7 kg)  10/03/23 240 lb (108.9 kg)  08/16/23 265 lb (120.2 kg)  05/31/23 258 lb 12.8 oz (117.4 kg)  05/10/23 261 lb (118.4 kg)     Prior Data     Component     Latest Ref Rng 08/19/2021 08/09/2022 11/03/2022 01/18/2023 02/01/2023  Total Bilirubin     0.0 - 1.2 mg/dL 1.1  0.9  0.7  0.6    Alkaline Phosphatase     44 - 121 IU/L 119  127 (H)  123 (H)  148 (H)    AST     0 - 40 IU/L 34  24  26  28     ALT     0 - 32 IU/L 38 (H)  33 (H)  32  32     Component     Latest Ref Rng 03/13/2023 08/16/2023 11/16/2023 02/11/2024 04/14/2024  Total Bilirubin     0.0 - 1.2 mg/dL 0.9  0.7  0.7  1.2  1.3 (H)   Alkaline Phosphatase     44 - 121 IU/L 128 (H)  123 (H)  149 (H)  107  116   AST     0 - 40 IU/L 30  39  51 (H)  53 (H)  62 (H)   ALT     0 - 32 IU/L 37 (H)  31  43 (H)  41 (H)  36 (H)         01/2023: Ferritin 146,  iron 74, iron sats 26, ANA negative, smooth muscle antibody negative, mitochondrial antibody negative, immunoglobulins normal, HCV antibody negative, hepatitis B surface antigen negative, TTG IgA less than 13 February 2023: GGT 26 normal  Abd u/s 03/2023: IMPRESSION: 1. The gallbladder is surgically absent. 2. Diffuse increased echogenicity throughout the liver is nonspecific and may be seen with hepatic steatosis or other underlying intrinsic liver disease. 3. Splenomegaly with a volume of 531 cc. 4. The proximal abdominal aorta and bifurcation were obscured by shadowing bowel gas.   Colonoscopy 02/2023:  -three 4-5 mm polyps in sigmoid colon and descending colon -moderate semi-liquid stool entire colon, lavage resulting in fair visualization, recommending colonoscopy in 5 years -tubular adenoma   EGD 02/2023: -z-line irregular, esophageal biopsies c/w reflux -mild chronic gastritis, negative for H.pylori  Medications   Current Outpatient Medications  Medication Sig Dispense Refill   Accu-Chek Softclix Lancets lancets Check BS QID Dx E11.9  400 each 3   Blood Glucose Monitoring Suppl (ACCU-CHEK GUIDE) w/Device KIT 1 each by Does not apply route 2 (two) times daily. 1 kit 0   DULoxetine  (CYMBALTA ) 60 MG capsule Take 2 capsules (120 mg total) by mouth daily. 180 capsule 3   esomeprazole  (NEXIUM ) 40 MG capsule Take 1 capsule by mouth once to twice daily before a meal. 60 capsule 5   fluticasone  (FLONASE ) 50 MCG/ACT nasal spray Place 1 spray into both nostrils 2 (two) times daily as needed for allergies or rhinitis. 16 g 6   gabapentin  (NEURONTIN ) 600 MG tablet Take 1 tablet (600 mg total) by mouth 3 (three) times daily. 270 tablet 3   glucose blood (ACCU-CHEK GUIDE TEST) test strip Check BS QID Dx E11.9 400 each 3   HUMULIN R  100 UNIT/ML injection INJECT 8 UNITS SUBCUTANEOUSLY THREE TIMES DAILY BEFORE MEALS 10 mL 0   Insulin  Syringe-Needle U-100 (BD INSULIN  SYRINGE U/F) 31G X 5/16 0.3 ML MISC USE FOUR TIMES DAILY w/ insulin  Dx E11.9 400 each 3   levothyroxine  (SYNTHROID ) 137 MCG tablet TAKE 1 TABLET BY MOUTH ONCE DAILY BEFORE BREAKFAST 90 tablet 3   lisinopril  (ZESTRIL ) 40 MG tablet Take 1 tablet (40 mg total) by mouth daily. 90 tablet 3   metFORMIN  (GLUCOPHAGE ) 500 MG tablet Take 1 tablet (500 mg total) by mouth 2 (two) times daily with a meal. 180 tablet 3   methocarbamol  (ROBAXIN ) 500 MG tablet Take 1 tablet (500 mg total) by mouth 3 (three) times daily. 90 tablet 2   NON FORMULARY Take 1 Dose by mouth daily. Liver supplement     OLANZapine  (ZYPREXA ) 20 MG tablet Take 1 tablet (20 mg total) by mouth at bedtime. 90 tablet 3   rosuvastatin  (CRESTOR ) 10 MG tablet Take 1 tablet (10 mg total) by mouth daily. 90 tablet 3   TRESIBA  FLEXTOUCH 200 UNIT/ML FlexTouch Pen Inject 26-40 Units into the skin at bedtime. Give 90-day supply with each refill 20 mL 3   No current facility-administered medications for this visit.    Allergies   Allergies as of 09/01/2024   (No Known Allergies)     Review of Systems   General: Negative for  anorexia, weight loss, fever, chills, fatigue, weakness. ENT: Negative for hoarseness, difficulty swallowing , nasal congestion. CV: Negative for chest pain, angina, palpitations, dyspnea on exertion, peripheral edema.  Respiratory: Negative for dyspnea at rest, dyspnea on exertion, cough, sputum, wheezing.  GI: See history of present illness. GU:  Negative for dysuria, hematuria, urinary incontinence, urinary frequency, nocturnal urination.  Endo: Negative for unusual weight change.     Physical Exam   BP (!) 154/99 (BP Location: Right Arm, Patient Position: Sitting, Cuff Size: Large)   Pulse (!) 101   Temp 98.1 F (36.7 C) (Oral)   Ht 5' 4 (1.626 m)   Wt 252 lb 9.6 oz (114.6 kg)   SpO2 95%   BMI 43.36 kg/m    General: Well-nourished, well-developed in no acute distress.  Eyes: No icterus. Mouth: Oropharyngeal mucosa moist and pink   Lungs: Clear to auscultation bilaterally.  Heart: Regular rate and rhythm, no murmurs rubs or gallops.  Abdomen: Bowel sounds are normal, nontender, nondistended, no hepatosplenomegaly or masses,  no abdominal bruits or hernia , no rebound or guarding. Exam limited by body habitus Rectal: not performed Extremities: No lower extremity edema. No clubbing or deformities. Neuro: Alert and oriented x 4   Skin: Warm and dry, no jaundice.   Psych: Alert and cooperative, normal mood and affect.  Labs   Lab Results  Component Value Date   HGBA1C 7.3 (H) 07/16/2024   Lab Results  Component Value Date   NA 142 04/14/2024   CL 102 04/14/2024   K 4.2 04/14/2024   CO2 22 04/14/2024   BUN 9 04/14/2024   CREATININE 0.62 04/14/2024   EGFR 104 04/14/2024   CALCIUM  9.7 04/14/2024   ALBUMIN 4.4 04/14/2024   GLUCOSE 166 (H) 04/14/2024   Lab Results  Component Value Date   TSH 3.330 04/14/2024   Lab Results  Component Value Date   WBC 6.6 04/14/2024   HGB 15.1 04/14/2024   HCT 45.1 04/14/2024   MCV 89 04/14/2024   PLT 271 04/14/2024     Imaging Studies   No results found.  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 Normal GGT. Fatty liver on U/S. Previous serological work up negative. Splenomegaly noted on U/S.  - June 2025 labs with FIB4 2.71, needs further investigation, likely at least F2-F3. -NAFLD fibrosis score of 0.92 suggestive of F3-F4, can be affected by elevated BMIs -recommend slow gradual weight loss -low fat/low cholesterol/low sugar diet -exercise daily as tolerated -avoid etoh -she may not be a  - continue control of her LDL - additional labs to check Hep A/B immune status, rule out iron overload, A1A phenotype, ELF, recalculate FIB-4  - MRI/MRCP to better evaluate liver/bile ducts, intermittent RUQ pain. Rule out obstructive process as source of elevated LFTs.   GERD: well controlled -continue esomeprazole  40mg  once to twice daily before a meal        Sonny RAMAN. Ezzard, MHS, PA-C [facility]

## 2024-09-01 NOTE — Telephone Encounter (Signed)
 Per cohere pending review Tracking [id number]

## 2024-09-01 NOTE — Patient Instructions (Signed)
 Please continue esomeprazole  once to twice daily to control acid reflux.  Complete labs at [facility].  MRI liver in near future.   For fatty liver: Instructions for fatty liver: Recommend 1-2# weight loss per week until ideal body weight through exercise & diet. Low fat/cholesterol diet.   Avoid sweets, sodas, fruit juices, sweetened beverages like tea, etc. Gradually increase exercise from 15 min daily up to 1 hr per day 5 days/week. No alcohol use.

## 2024-09-02 DIAGNOSIS — K219 Gastro-esophageal reflux disease without esophagitis: Secondary | ICD-10-CM | POA: Diagnosis not present

## 2024-09-02 DIAGNOSIS — K760 Fatty (change of) liver, not elsewhere classified: Secondary | ICD-10-CM | POA: Diagnosis not present

## 2024-09-03 NOTE — Telephone Encounter (Signed)
 Pt stated that she called and rescheduled the MRI appointment due to having another appointment on 09/08/24.She has been rescheduled for 09/10/24.

## 2024-09-03 NOTE — Telephone Encounter (Signed)
 MRI/MRCP scheduled for 10/27, arrival 7:15am, npo 4 hrs prior Called pt, LMOVM to call back

## 2024-09-03 NOTE — Telephone Encounter (Signed)
 PA approved via cohere Authorization #[id number] DOS: 09/01/2024 - 10/31/2024

## 2024-09-03 NOTE — Telephone Encounter (Signed)
 LMOVM to return call  Pt left vm returning call

## 2024-09-08 ENCOUNTER — Ambulatory Visit (HOSPITAL_COMMUNITY)

## 2024-09-08 DIAGNOSIS — G5703 Lesion of sciatic nerve, bilateral lower limbs: Secondary | ICD-10-CM | POA: Diagnosis not present

## 2024-09-08 LAB — HEPATITIS B SURFACE ANTIBODY,QUALITATIVE: Hep B Surface Ab, Qual: NONREACTIVE

## 2024-09-08 LAB — BASIC METABOLIC PANEL WITH GFR
BUN/Creatinine Ratio: 17 (ref 9–23)
BUN: 11 mg/dL (ref 6–24)
CO2: 24 mmol/L (ref 20–29)
Calcium: 10.2 mg/dL (ref 8.7–10.2)
Chloride: 103 mmol/L (ref 96–106)
Creatinine, Ser: 0.65 mg/dL (ref 0.57–1.00)
Glucose: 183 mg/dL — ABNORMAL HIGH (ref 70–99)
Potassium: 4.4 mmol/L (ref 3.5–5.2)
Sodium: 142 mmol/L (ref 134–144)
eGFR: 103 mL/min/1.73 (ref >59)

## 2024-09-08 LAB — IRON,TIBC AND FERRITIN PANEL
Ferritin: 102 ng/mL (ref 15–150)
Iron Saturation: 19 % (ref 15–55)
Iron: 65 ug/dL (ref 27–159)
Total Iron Binding Capacity: 335 ug/dL (ref 250–450)
UIBC: 270 ug/dL (ref 131–425)

## 2024-09-08 LAB — HEPATIC FUNCTION PANEL
ALT: 33 IU/L — ABNORMAL HIGH (ref 0–32)
AST: 32 IU/L (ref 0–40)
Albumin: 4.2 g/dL (ref 3.8–4.9)
Alkaline Phosphatase: 127 IU/L (ref 49–135)
Bilirubin Total: 1.1 mg/dL (ref 0.0–1.2)
Bilirubin, Direct: 0.3 mg/dL (ref 0.00–0.40)
Total Protein: 6.8 g/dL (ref 6.0–8.5)

## 2024-09-08 LAB — HEPATITIS A ANTIBODY, TOTAL: hep A Total Ab: POSITIVE — AB

## 2024-09-08 LAB — CBC WITH DIFFERENTIAL/PLATELET
Basophils Absolute: 0.1 x10E3/uL (ref 0.0–0.2)
Basos: 1 %
EOS (ABSOLUTE): 0 x10E3/uL (ref 0.0–0.4)
Eos: 0 %
Hematocrit: 42.2 % (ref 34.0–46.6)
Hemoglobin: 13.9 g/dL (ref 11.1–15.9)
Immature Grans (Abs): 0 x10E3/uL (ref 0.0–0.1)
Immature Granulocytes: 0 %
Lymphocytes Absolute: 2.7 x10E3/uL (ref 0.7–3.1)
Lymphs: 33 %
MCH: 29.4 pg (ref 26.6–33.0)
MCHC: 32.9 g/dL (ref 31.5–35.7)
MCV: 89 fL (ref 79–97)
Monocytes Absolute: 0.7 x10E3/uL (ref 0.1–0.9)
Monocytes: 8 %
Neutrophils Absolute: 4.7 x10E3/uL (ref 1.4–7.0)
Neutrophils: 57 %
Platelets: 308 x10E3/uL (ref 150–450)
RBC: 4.72 x10E6/uL (ref 3.77–5.28)
RDW: 13.1 % (ref 11.7–15.4)
WBC: 8.1 x10E3/uL (ref 3.4–10.8)

## 2024-09-08 LAB — CERULOPLASMIN: Ceruloplasmin: 33.5 mg/dL (ref 19.0–39.0)

## 2024-09-08 LAB — ALPHA-1-ANTITRYPSIN PHENOTYP: A-1 Antitrypsin: 138 mg/dL (ref 101–187)

## 2024-09-08 LAB — ENHANCED LIVER FIBROSIS (ELF): ELF(TM) Score: 10.14 — ABNORMAL HIGH (ref <9.80)

## 2024-09-10 ENCOUNTER — Ambulatory Visit (HOSPITAL_COMMUNITY)
Admission: RE | Admit: 2024-09-10 | Discharge: 2024-09-10 | Disposition: A | Discharge status: Home / Self Care | Source: Ambulatory Visit | Attending: Gastroenterology | Admitting: Gastroenterology

## 2024-09-10 ENCOUNTER — Other Ambulatory Visit: Payer: Self-pay | Admitting: Gastroenterology

## 2024-09-10 DIAGNOSIS — R1011 Right upper quadrant pain: Secondary | ICD-10-CM | POA: Insufficient documentation

## 2024-09-10 DIAGNOSIS — R7989 Other specified abnormal findings of blood chemistry: Secondary | ICD-10-CM | POA: Diagnosis not present

## 2024-09-10 DIAGNOSIS — K760 Fatty (change of) liver, not elsewhere classified: Secondary | ICD-10-CM | POA: Insufficient documentation

## 2024-09-10 DIAGNOSIS — K219 Gastro-esophageal reflux disease without esophagitis: Secondary | ICD-10-CM | POA: Diagnosis not present

## 2024-09-10 MED ORDER — GADOBUTROL 1 MMOL/ML IV SOLN
10.0000 mL | Freq: Once | INTRAVENOUS | Status: AC | PRN
  Administered 2024-09-10: 10 mL via INTRAVENOUS

## 2024-10-13 ENCOUNTER — Ambulatory Visit: Payer: Self-pay

## 2024-10-14 ENCOUNTER — Ambulatory Visit: Payer: Self-pay | Admitting: Gastroenterology

## 2024-10-22 ENCOUNTER — Encounter: Payer: Self-pay | Admitting: Family Medicine

## 2024-10-22 ENCOUNTER — Ambulatory Visit (INDEPENDENT_AMBULATORY_CARE_PROVIDER_SITE_OTHER): Payer: Self-pay | Admitting: Family Medicine

## 2024-10-22 ENCOUNTER — Telehealth: Payer: Self-pay | Admitting: Family Medicine

## 2024-10-22 VITALS — BP 126/85 | HR 93 | Ht 64.0 in | Wt 255.0 lb

## 2024-10-22 DIAGNOSIS — I152 Hypertension secondary to endocrine disorders: Secondary | ICD-10-CM

## 2024-10-22 DIAGNOSIS — I10 Essential (primary) hypertension: Secondary | ICD-10-CM | POA: Diagnosis not present

## 2024-10-22 DIAGNOSIS — E039 Hypothyroidism, unspecified: Secondary | ICD-10-CM

## 2024-10-22 DIAGNOSIS — Z0279 Encounter for issue of other medical certificate: Secondary | ICD-10-CM

## 2024-10-22 DIAGNOSIS — E119 Type 2 diabetes mellitus without complications: Secondary | ICD-10-CM

## 2024-10-22 DIAGNOSIS — Z794 Long term (current) use of insulin: Secondary | ICD-10-CM | POA: Diagnosis not present

## 2024-10-22 DIAGNOSIS — F321 Major depressive disorder, single episode, moderate: Secondary | ICD-10-CM

## 2024-10-22 LAB — BAYER DCA HB A1C WAIVED: HB A1C (BAYER DCA - WAIVED): 6.8 % — ABNORMAL HIGH (ref 4.8–5.6)

## 2024-10-22 NOTE — Progress Notes (Signed)
 BP 126/85   Pulse 93   Ht 5' 4 (1.626 m)   Wt 255 lb (115.7 kg)   SpO2 95%   BMI 43.77 kg/m    Subjective:   Patient ID: Tina Chapman, female    DOB: 01-20-1966, 58 y.o.   MRN: [id number]  HPI: Tina Chapman is a 58 y.o. female presenting on 10/22/2024 for Medical Management of Chronic Issues, Diabetes, Hypothyroidism, and Hypertension   Discussed the use of AI scribe software for clinical note transcription with the patient, who gave verbal consent to proceed.  History of Present Illness   Tina Chapman is a 58 year old female with diabetes who presents for a recheck of her condition.  Glycemic control - Hemoglobin A1c improved to 6.8% from previous 7.3%. - Currently taking 13 units of Tresiba  daily and 10 units of insulin  with meals, in addition to metformin .  Mood disturbance and sleep quality - Depression managed with olanzapine  and Cymbalta . - Olanzapine  provides mild improvement in sleep quality.  Cardiovascular and respiratory status - Blood pressure 126/85 mmHg. - Heart rate and oxygen saturation within normal limits.          Relevant past medical, surgical, family and social history reviewed and updated as indicated. Interim medical history since our last visit reviewed. Allergies and medications reviewed and updated.  Review of Systems  Constitutional:  Negative for chills and fever.  Eyes:  Negative for redness and visual disturbance.  Respiratory:  Negative for chest tightness and shortness of breath.   Cardiovascular:  Negative for chest pain and leg swelling.  Skin:  Negative for rash.  Neurological:  Negative for light-headedness and headaches.  Psychiatric/Behavioral:  Positive for dysphoric mood. Negative for agitation, behavioral problems, self-injury, sleep disturbance and suicidal ideas. The patient is nervous/anxious.   All other systems reviewed and are negative.   Per HPI unless specifically indicated  above   Allergies as of 10/22/2024   No Known Allergies      Medication List        Accurate as of October 22, 2024 10:41 AM. If you have any questions, ask your nurse or doctor.          Accu-Chek Guide Test test strip Generic drug: glucose blood Check BS QID Dx E11.9   Accu-Chek Guide w/Device Kit 1 each by Does not apply route 2 (two) times daily.   Accu-Chek Softclix Lancets lancets Check BS QID Dx E11.9   DULoxetine  60 MG capsule Commonly known as: CYMBALTA  Take 2 capsules (120 mg total) by mouth daily.   esomeprazole  40 MG capsule Commonly known as: NEXIUM  Take 1 capsule by mouth once to twice daily before a meal.   fluticasone  50 MCG/ACT nasal spray Commonly known as: FLONASE  Place 1 spray into both nostrils 2 (two) times daily as needed for allergies or rhinitis.   gabapentin  600 MG tablet Commonly known as: NEURONTIN  Take 1 tablet (600 mg total) by mouth 3 (three) times daily.   HumuLIN R  100 UNIT/ML injection Generic drug: insulin  regular INJECT 8 UNITS SUBCUTANEOUSLY THREE TIMES DAILY BEFORE MEALS   Insulin  Syringe-Needle U-100 31G X 5/16 0.3 ML Misc Commonly known as: BD Insulin  Syringe U/F USE FOUR TIMES DAILY w/ insulin  Dx E11.9   levothyroxine  137 MCG tablet Commonly known as: SYNTHROID  TAKE 1 TABLET BY MOUTH ONCE DAILY BEFORE BREAKFAST   lisinopril  40 MG tablet Commonly known as: ZESTRIL  Take 1 tablet (40 mg total) by mouth daily.   metFORMIN  500 MG tablet  Commonly known as: GLUCOPHAGE  Take 1 tablet (500 mg total) by mouth 2 (two) times daily with a meal.   methocarbamol  500 MG tablet Commonly known as: ROBAXIN  Take 1 tablet (500 mg total) by mouth 3 (three) times daily.   NON FORMULARY Take 1 Dose by mouth daily. Liver supplement   OLANZapine  20 MG tablet Commonly known as: ZYPREXA  Take 1 tablet (20 mg total) by mouth at bedtime.   rosuvastatin  10 MG tablet Commonly known as: Crestor  Take 1 tablet (10 mg total) by mouth  daily.   Tresiba  FlexTouch 200 UNIT/ML FlexTouch Pen Generic drug: insulin  degludec Inject 26-40 Units into the skin at bedtime. Give 90-day supply with each refill         Objective:   BP 126/85   Pulse 93   Ht 5' 4 (1.626 m)   Wt 255 lb (115.7 kg)   SpO2 95%   BMI 43.77 kg/m   Wt Readings from Last 3 Encounters:  10/22/24 255 lb (115.7 kg)  09/01/24 252 lb 9.6 oz (114.6 kg)  07/16/24 254 lb (115.2 kg)    Physical Exam Vitals and nursing note reviewed.    Physical Exam   VITALS: BP- 126/85 CHEST: Lungs clear to auscultation bilaterally. CARDIOVASCULAR: Regular rate and rhythm, no murmurs.         Assessment & Plan:   Problem List Items Addressed This Visit       Cardiovascular and Mediastinum   Hypertension associated with type 2 diabetes mellitus (HCC)     Endocrine   Hypothyroidism (acquired)   Relevant Orders   TSH   Diabetes mellitus, type II, insulin  dependent (HCC) - Primary   Relevant Orders   Bayer DCA Hb A1c Waived   CBC with Differential/Platelet   CMP14+EGFR   Lipid panel   Vitamin B12     Other   Moderate major depression (HCC)   Morbid obesity (HCC)   Other Visit Diagnoses       Primary hypertension       Relevant Orders   Bayer DCA Hb A1c Waived   CBC with Differential/Platelet   CMP14+EGFR   Lipid panel          Type 2 diabetes mellitus Improved glycemic control with Hemoglobin A1c at 6.8%. - Continue current insulin  regimen: 13 units of Tresiba  and 10 units of rapid-acting insulin  with meals. - Continue metformin  as prescribed.  Primary hypertension Well-controlled with blood pressure at 126/85 mmHg.  Major depressive disorder, single episode, moderate Olanzapine  beneficial for sleep. Cymbalta  also taken. - Continue olanzapine  and Cymbalta  as prescribed.  General Health Maintenance - Schedule eye exam. - Schedule mammogram.          Follow up plan: Return in about 3 months (around 01/20/2025), or if  symptoms worsen or fail to improve, for Diabetes and depression.  Counseling provided for all of the vaccine components Orders Placed This Encounter  Procedures   Bayer DCA Hb A1c Waived   CBC with Differential/Platelet   CMP14+EGFR   Lipid panel   TSH   Vitamin B12    Fonda Levins, MD [facility] 10/22/2024, 10:41 AM

## 2024-10-22 NOTE — Telephone Encounter (Signed)
 Tina Chapman printed handicap forms to be completed and signed. Was done at appt with Dettinger.  Form Fee Paid? (Y/N)       y     If NO, form is placed on front office manager desk to hold until payment received. If YES, then form will be placed in the RX/HH Nurse Coordinators box for completion.  Form will not be processed until payment is received

## 2024-10-23 LAB — CMP14+EGFR
ALT: 32 IU/L (ref 0–32)
AST: 29 IU/L (ref 0–40)
Albumin: 4 g/dL (ref 3.8–4.9)
Alkaline Phosphatase: 134 IU/L (ref 49–135)
BUN/Creatinine Ratio: 20 (ref 9–23)
BUN: 14 mg/dL (ref 6–24)
Bilirubin Total: 0.5 mg/dL (ref 0.0–1.2)
CO2: 23 mmol/L (ref 20–29)
Calcium: 10.2 mg/dL (ref 8.7–10.2)
Chloride: 100 mmol/L (ref 96–106)
Creatinine, Ser: 0.69 mg/dL (ref 0.57–1.00)
Globulin, Total: 2.8 g/dL (ref 1.5–4.5)
Glucose: 167 mg/dL — ABNORMAL HIGH (ref 70–99)
Potassium: 4.3 mmol/L (ref 3.5–5.2)
Sodium: 140 mmol/L (ref 134–144)
Total Protein: 6.8 g/dL (ref 6.0–8.5)
eGFR: 101 mL/min/1.73 (ref >59)

## 2024-10-23 LAB — CBC WITH DIFFERENTIAL/PLATELET
Basophils Absolute: 0.1 x10E3/uL (ref 0.0–0.2)
Basos: 1 %
EOS (ABSOLUTE): 0 x10E3/uL (ref 0.0–0.4)
Eos: 0 %
Hematocrit: 40.1 % (ref 34.0–46.6)
Hemoglobin: 13.4 g/dL (ref 11.1–15.9)
Immature Grans (Abs): 0.1 x10E3/uL (ref 0.0–0.1)
Immature Granulocytes: 1 %
Lymphocytes Absolute: 2.9 x10E3/uL (ref 0.7–3.1)
Lymphs: 34 %
MCH: 30 pg (ref 26.6–33.0)
MCHC: 33.4 g/dL (ref 31.5–35.7)
MCV: 90 fL (ref 79–97)
Monocytes Absolute: 0.6 x10E3/uL (ref 0.1–0.9)
Monocytes: 8 %
Neutrophils Absolute: 4.8 x10E3/uL (ref 1.4–7.0)
Neutrophils: 56 %
Platelets: 260 x10E3/uL (ref 150–450)
RBC: 4.46 x10E6/uL (ref 3.77–5.28)
RDW: 12 % (ref 11.7–15.4)
WBC: 8.4 x10E3/uL (ref 3.4–10.8)

## 2024-10-23 LAB — LIPID PANEL
Chol/HDL Ratio: 3 ratio (ref 0.0–4.4)
Cholesterol, Total: 148 mg/dL (ref 100–199)
HDL: 50 mg/dL (ref >39)
LDL Chol Calc (NIH): 65 mg/dL (ref 0–99)
Triglycerides: 203 mg/dL — ABNORMAL HIGH (ref 0–149)
VLDL Cholesterol Cal: 33 mg/dL (ref 5–40)

## 2024-10-23 LAB — VITAMIN B12: Vitamin B-12: 550 pg/mL (ref 232–1245)

## 2024-10-23 LAB — TSH: TSH: 0.231 u[IU]/mL — ABNORMAL LOW (ref 0.450–4.500)

## 2024-10-29 ENCOUNTER — Ambulatory Visit: Payer: Self-pay | Admitting: Family Medicine

## 2024-11-13 ENCOUNTER — Encounter: Payer: Self-pay | Admitting: Gastroenterology

## 2024-11-30 ENCOUNTER — Other Ambulatory Visit: Payer: Self-pay | Admitting: Family Medicine

## 2024-11-30 DIAGNOSIS — E119 Type 2 diabetes mellitus without complications: Secondary | ICD-10-CM

## 2024-12-16 ENCOUNTER — Telehealth: Payer: Self-pay | Admitting: *Deleted

## 2024-12-16 NOTE — Telephone Encounter (Signed)
 She canceled her 2/20 appt and will call back once back in town --we may want to touch base back with her after 1st week of March when she gets back

## 2025-01-02 ENCOUNTER — Ambulatory Visit: Admitting: Gastroenterology

## 2025-01-13 ENCOUNTER — Ambulatory Visit

## 2025-02-09 ENCOUNTER — Encounter: Admitting: Family Medicine
